# Patient Record
Sex: Female | Born: 1935
Health system: Southern US, Community
[De-identification: ages and names within clinical notes are randomized; demographics above are authoritative.]

## PROBLEM LIST (undated history)

## (undated) DIAGNOSIS — K219 Gastro-esophageal reflux disease without esophagitis: Secondary | ICD-10-CM

## (undated) DIAGNOSIS — G4730 Sleep apnea, unspecified: Secondary | ICD-10-CM

## (undated) DIAGNOSIS — I10 Essential (primary) hypertension: Secondary | ICD-10-CM

## (undated) DIAGNOSIS — C801 Malignant (primary) neoplasm, unspecified: Secondary | ICD-10-CM

## (undated) DIAGNOSIS — E785 Hyperlipidemia, unspecified: Secondary | ICD-10-CM

## (undated) DIAGNOSIS — M1990 Unspecified osteoarthritis, unspecified site: Secondary | ICD-10-CM

## (undated) DIAGNOSIS — R600 Localized edema: Secondary | ICD-10-CM

## (undated) DIAGNOSIS — T7840XA Allergy, unspecified, initial encounter: Secondary | ICD-10-CM

## (undated) DIAGNOSIS — D649 Anemia, unspecified: Secondary | ICD-10-CM

## (undated) HISTORY — DX: Allergy, unspecified, initial encounter: T78.40XA

## (undated) HISTORY — PX: PARTIAL HIP ARTHROPLASTY: SHX733

## (undated) HISTORY — DX: Unspecified osteoarthritis, unspecified site: M19.90

## (undated) HISTORY — DX: Hyperlipidemia, unspecified: E78.5

## (undated) HISTORY — DX: Essential (primary) hypertension: I10

## (undated) HISTORY — PX: LARYNGECTOMY: SUR815

## (undated) HISTORY — DX: Gastro-esophageal reflux disease without esophagitis: K21.9

## (undated) HISTORY — DX: Malignant (primary) neoplasm, unspecified: C80.1

## (undated) HISTORY — DX: Anemia, unspecified: D64.9

---

## 1997-11-02 ENCOUNTER — Ambulatory Visit (HOSPITAL_COMMUNITY): Admission: RE | Admit: 1997-11-02 | Discharge: 1997-11-02 | Payer: Self-pay | Admitting: Internal Medicine

## 1998-11-08 ENCOUNTER — Ambulatory Visit (HOSPITAL_COMMUNITY): Admission: RE | Admit: 1998-11-08 | Discharge: 1998-11-08 | Payer: Self-pay | Admitting: Internal Medicine

## 1998-11-08 ENCOUNTER — Encounter: Payer: Self-pay | Admitting: Internal Medicine

## 1999-09-12 ENCOUNTER — Other Ambulatory Visit: Admission: RE | Admit: 1999-09-12 | Discharge: 1999-09-12 | Payer: Self-pay | Admitting: Internal Medicine

## 1999-11-09 ENCOUNTER — Encounter: Payer: Self-pay | Admitting: Internal Medicine

## 1999-11-09 ENCOUNTER — Ambulatory Visit (HOSPITAL_COMMUNITY): Admission: RE | Admit: 1999-11-09 | Discharge: 1999-11-09 | Payer: Self-pay

## 2000-11-19 ENCOUNTER — Encounter: Payer: Self-pay | Admitting: Internal Medicine

## 2000-11-19 ENCOUNTER — Ambulatory Visit (HOSPITAL_COMMUNITY): Admission: RE | Admit: 2000-11-19 | Discharge: 2000-11-19 | Payer: Self-pay | Admitting: Internal Medicine

## 2001-11-25 ENCOUNTER — Encounter: Payer: Self-pay | Admitting: Internal Medicine

## 2001-11-25 ENCOUNTER — Ambulatory Visit (HOSPITAL_COMMUNITY): Admission: RE | Admit: 2001-11-25 | Discharge: 2001-11-25 | Payer: Self-pay | Admitting: Internal Medicine

## 2002-03-05 LAB — HM PAP SMEAR

## 2002-05-05 ENCOUNTER — Other Ambulatory Visit: Admission: RE | Admit: 2002-05-05 | Discharge: 2002-05-05 | Payer: Self-pay | Admitting: Family Medicine

## 2002-05-05 LAB — CONVERTED CEMR LAB: Pap Smear: NORMAL

## 2002-11-27 ENCOUNTER — Encounter: Payer: Self-pay | Admitting: Internal Medicine

## 2002-11-27 ENCOUNTER — Ambulatory Visit (HOSPITAL_COMMUNITY): Admission: RE | Admit: 2002-11-27 | Discharge: 2002-11-27 | Payer: Self-pay | Admitting: Internal Medicine

## 2003-08-24 ENCOUNTER — Encounter: Admission: RE | Admit: 2003-08-24 | Discharge: 2003-11-22 | Payer: Self-pay | Admitting: Internal Medicine

## 2003-09-22 ENCOUNTER — Ambulatory Visit (HOSPITAL_BASED_OUTPATIENT_CLINIC_OR_DEPARTMENT_OTHER): Admission: RE | Admit: 2003-09-22 | Discharge: 2003-09-22 | Payer: Self-pay | Admitting: Internal Medicine

## 2003-12-07 ENCOUNTER — Ambulatory Visit (HOSPITAL_COMMUNITY): Admission: RE | Admit: 2003-12-07 | Discharge: 2003-12-07 | Payer: Self-pay | Admitting: Internal Medicine

## 2003-12-17 ENCOUNTER — Ambulatory Visit: Payer: Self-pay | Admitting: Internal Medicine

## 2004-02-07 ENCOUNTER — Ambulatory Visit: Payer: Self-pay | Admitting: Pulmonary Disease

## 2004-03-02 ENCOUNTER — Ambulatory Visit: Payer: Self-pay | Admitting: Pulmonary Disease

## 2004-03-07 ENCOUNTER — Inpatient Hospital Stay (HOSPITAL_COMMUNITY): Admission: RE | Admit: 2004-03-07 | Discharge: 2004-03-10 | Payer: Self-pay | Admitting: Orthopedic Surgery

## 2004-03-07 ENCOUNTER — Ambulatory Visit: Payer: Self-pay | Admitting: Physical Medicine & Rehabilitation

## 2004-03-10 ENCOUNTER — Inpatient Hospital Stay
Admission: RE | Admit: 2004-03-10 | Discharge: 2004-03-21 | Payer: Self-pay | Admitting: Physical Medicine & Rehabilitation

## 2004-09-15 ENCOUNTER — Ambulatory Visit: Payer: Self-pay | Admitting: Internal Medicine

## 2004-12-12 ENCOUNTER — Ambulatory Visit: Payer: Self-pay | Admitting: Internal Medicine

## 2004-12-12 ENCOUNTER — Ambulatory Visit (HOSPITAL_COMMUNITY): Admission: RE | Admit: 2004-12-12 | Discharge: 2004-12-12 | Payer: Self-pay | Admitting: Internal Medicine

## 2004-12-28 ENCOUNTER — Ambulatory Visit: Payer: Self-pay | Admitting: Internal Medicine

## 2005-03-13 ENCOUNTER — Ambulatory Visit: Payer: Self-pay | Admitting: Internal Medicine

## 2005-04-30 ENCOUNTER — Ambulatory Visit: Payer: Self-pay | Admitting: Internal Medicine

## 2005-05-07 ENCOUNTER — Ambulatory Visit: Payer: Self-pay | Admitting: Internal Medicine

## 2005-11-02 ENCOUNTER — Ambulatory Visit: Payer: Self-pay | Admitting: Internal Medicine

## 2005-11-09 ENCOUNTER — Ambulatory Visit: Payer: Self-pay | Admitting: Internal Medicine

## 2005-12-13 ENCOUNTER — Ambulatory Visit: Payer: Self-pay | Admitting: Internal Medicine

## 2005-12-13 ENCOUNTER — Ambulatory Visit (HOSPITAL_COMMUNITY): Admission: RE | Admit: 2005-12-13 | Discharge: 2005-12-13 | Payer: Self-pay | Admitting: Internal Medicine

## 2006-03-05 LAB — HM COLONOSCOPY

## 2006-03-05 LAB — HM DEXA SCAN

## 2006-06-27 ENCOUNTER — Ambulatory Visit: Payer: Self-pay | Admitting: Internal Medicine

## 2006-06-27 LAB — CONVERTED CEMR LAB
BUN: 8 mg/dL (ref 6–23)
Basophils Absolute: 0.1 10*3/uL (ref 0.0–0.1)
Basophils Relative: 0.6 % (ref 0.0–1.0)
CO2: 30 meq/L (ref 19–32)
Calcium: 9.1 mg/dL (ref 8.4–10.5)
Chloride: 103 meq/L (ref 96–112)
Creatinine, Ser: 0.6 mg/dL (ref 0.4–1.2)
Eosinophils Absolute: 0.3 10*3/uL (ref 0.0–0.6)
Eosinophils Relative: 3.1 % (ref 0.0–5.0)
GFR calc Af Amer: 126 mL/min
GFR calc non Af Amer: 104 mL/min
Glucose, Bld: 70 mg/dL (ref 70–99)
HCT: 37.3 % (ref 36.0–46.0)
Hemoglobin: 12 g/dL (ref 12.0–15.0)
Lymphocytes Relative: 25.3 % (ref 12.0–46.0)
MCHC: 32.2 g/dL (ref 30.0–36.0)
MCV: 80.6 fL (ref 78.0–100.0)
Monocytes Absolute: 0.7 10*3/uL (ref 0.2–0.7)
Monocytes Relative: 7.3 % (ref 3.0–11.0)
Neutro Abs: 5.7 10*3/uL (ref 1.4–7.7)
Neutrophils Relative %: 63.7 % (ref 43.0–77.0)
Platelets: 383 10*3/uL (ref 150–400)
Potassium: 4 meq/L (ref 3.5–5.1)
RBC: 4.63 M/uL (ref 3.87–5.11)
RDW: 14.5 % (ref 11.5–14.6)
Sodium: 141 meq/L (ref 135–145)
TSH: 0.44 microintl units/mL (ref 0.35–5.50)
Vit D, 1,25-Dihydroxy: 32 (ref 20–57)
WBC: 9.1 10*3/uL (ref 4.5–10.5)

## 2006-07-01 ENCOUNTER — Ambulatory Visit: Payer: Self-pay | Admitting: Internal Medicine

## 2006-07-19 ENCOUNTER — Ambulatory Visit: Payer: Self-pay | Admitting: Gastroenterology

## 2006-07-30 ENCOUNTER — Ambulatory Visit: Payer: Self-pay | Admitting: Gastroenterology

## 2006-07-30 LAB — HM COLONOSCOPY

## 2006-10-10 ENCOUNTER — Telehealth: Payer: Self-pay | Admitting: Internal Medicine

## 2006-12-04 ENCOUNTER — Encounter: Payer: Self-pay | Admitting: Internal Medicine

## 2006-12-05 ENCOUNTER — Telehealth (INDEPENDENT_AMBULATORY_CARE_PROVIDER_SITE_OTHER): Payer: Self-pay | Admitting: *Deleted

## 2006-12-06 ENCOUNTER — Ambulatory Visit: Payer: Self-pay | Admitting: Internal Medicine

## 2006-12-06 DIAGNOSIS — I10 Essential (primary) hypertension: Secondary | ICD-10-CM | POA: Insufficient documentation

## 2006-12-06 DIAGNOSIS — E785 Hyperlipidemia, unspecified: Secondary | ICD-10-CM | POA: Insufficient documentation

## 2006-12-06 DIAGNOSIS — J45909 Unspecified asthma, uncomplicated: Secondary | ICD-10-CM | POA: Insufficient documentation

## 2006-12-06 DIAGNOSIS — K219 Gastro-esophageal reflux disease without esophagitis: Secondary | ICD-10-CM | POA: Insufficient documentation

## 2006-12-06 DIAGNOSIS — J309 Allergic rhinitis, unspecified: Secondary | ICD-10-CM | POA: Insufficient documentation

## 2006-12-06 DIAGNOSIS — M1990 Unspecified osteoarthritis, unspecified site: Secondary | ICD-10-CM | POA: Insufficient documentation

## 2006-12-18 ENCOUNTER — Ambulatory Visit (HOSPITAL_COMMUNITY): Admission: RE | Admit: 2006-12-18 | Discharge: 2006-12-18 | Payer: Self-pay | Admitting: Internal Medicine

## 2007-05-16 ENCOUNTER — Ambulatory Visit: Payer: Self-pay | Admitting: Cardiology

## 2007-05-29 ENCOUNTER — Encounter: Payer: Self-pay | Admitting: Internal Medicine

## 2007-05-29 ENCOUNTER — Ambulatory Visit: Payer: Self-pay

## 2007-05-29 ENCOUNTER — Encounter: Payer: Self-pay | Admitting: Cardiology

## 2007-09-19 ENCOUNTER — Telehealth (INDEPENDENT_AMBULATORY_CARE_PROVIDER_SITE_OTHER): Payer: Self-pay | Admitting: *Deleted

## 2007-11-25 ENCOUNTER — Ambulatory Visit: Payer: Self-pay | Admitting: Internal Medicine

## 2007-12-25 ENCOUNTER — Ambulatory Visit (HOSPITAL_COMMUNITY): Admission: RE | Admit: 2007-12-25 | Discharge: 2007-12-25 | Payer: Self-pay | Admitting: Internal Medicine

## 2007-12-25 LAB — HM MAMMOGRAPHY

## 2007-12-30 ENCOUNTER — Encounter: Payer: Self-pay | Admitting: Internal Medicine

## 2008-03-26 ENCOUNTER — Ambulatory Visit: Payer: Self-pay | Admitting: Internal Medicine

## 2008-03-26 DIAGNOSIS — N318 Other neuromuscular dysfunction of bladder: Secondary | ICD-10-CM | POA: Insufficient documentation

## 2008-03-26 DIAGNOSIS — R0609 Other forms of dyspnea: Secondary | ICD-10-CM | POA: Insufficient documentation

## 2008-03-26 DIAGNOSIS — M899 Disorder of bone, unspecified: Secondary | ICD-10-CM | POA: Insufficient documentation

## 2008-03-26 DIAGNOSIS — M949 Disorder of cartilage, unspecified: Secondary | ICD-10-CM

## 2008-03-26 DIAGNOSIS — R0989 Other specified symptoms and signs involving the circulatory and respiratory systems: Secondary | ICD-10-CM | POA: Insufficient documentation

## 2008-03-29 ENCOUNTER — Encounter: Payer: Self-pay | Admitting: Internal Medicine

## 2008-03-29 LAB — CONVERTED CEMR LAB
ALT: 15 units/L (ref 0–35)
AST: 20 units/L (ref 0–37)
Albumin: 3.6 g/dL (ref 3.5–5.2)
Alkaline Phosphatase: 74 units/L (ref 39–117)
BUN: 10 mg/dL (ref 6–23)
Basophils Absolute: 0 10*3/uL (ref 0.0–0.1)
Basophils Relative: 0.3 % (ref 0.0–3.0)
Bilirubin, Direct: 0.1 mg/dL (ref 0.0–0.3)
CO2: 31 meq/L (ref 19–32)
Calcium: 9.1 mg/dL (ref 8.4–10.5)
Chloride: 104 meq/L (ref 96–112)
Cholesterol: 162 mg/dL (ref 0–200)
Creatinine, Ser: 0.7 mg/dL (ref 0.4–1.2)
Eosinophils Absolute: 0.3 10*3/uL (ref 0.0–0.7)
Eosinophils Relative: 3.4 % (ref 0.0–5.0)
GFR calc Af Amer: 105 mL/min
GFR calc non Af Amer: 87 mL/min
Glucose, Bld: 94 mg/dL (ref 70–99)
HCT: 30.3 % — ABNORMAL LOW (ref 36.0–46.0)
HDL: 56 mg/dL (ref >39.0)
Hemoglobin: 9.7 g/dL — ABNORMAL LOW (ref 12.0–15.0)
LDL Cholesterol: 84 mg/dL (ref 0–99)
Lymphocytes Relative: 22.5 % (ref 12.0–46.0)
MCHC: 32 g/dL (ref 30.0–36.0)
MCV: 73.5 fL — ABNORMAL LOW (ref 78.0–100.0)
Monocytes Absolute: 0.5 10*3/uL (ref 0.1–1.0)
Monocytes Relative: 6.6 % (ref 3.0–12.0)
Neutro Abs: 5.1 10*3/uL (ref 1.4–7.7)
Neutrophils Relative %: 67.2 % (ref 43.0–77.0)
Platelets: 371 10*3/uL (ref 150–400)
Potassium: 3.6 meq/L (ref 3.5–5.1)
RBC: 4.13 M/uL (ref 3.87–5.11)
RDW: 16.6 % — ABNORMAL HIGH (ref 11.5–14.6)
Sodium: 141 meq/L (ref 135–145)
TSH: 0.57 microintl units/mL (ref 0.35–5.50)
Total Bilirubin: 0.7 mg/dL (ref 0.3–1.2)
Total CHOL/HDL Ratio: 2.9
Total Protein: 7.4 g/dL (ref 6.0–8.3)
Triglycerides: 109 mg/dL (ref 0–149)
VLDL: 22 mg/dL (ref 0–40)
WBC: 7.6 10*3/uL (ref 4.5–10.5)

## 2008-04-01 LAB — CONVERTED CEMR LAB: Vit D, 1,25-Dihydroxy: 21 — ABNORMAL LOW (ref 30–89)

## 2008-04-13 ENCOUNTER — Ambulatory Visit: Payer: Self-pay | Admitting: Internal Medicine

## 2008-04-13 DIAGNOSIS — E559 Vitamin D deficiency, unspecified: Secondary | ICD-10-CM | POA: Insufficient documentation

## 2008-04-14 LAB — CONVERTED CEMR LAB
Basophils Absolute: 0 10*3/uL (ref 0.0–0.1)
Basophils Relative: 0.3 % (ref 0.0–3.0)
Eosinophils Absolute: 0.2 10*3/uL (ref 0.0–0.7)
Eosinophils Relative: 2.6 % (ref 0.0–5.0)
Ferritin: 7.7 ng/mL — ABNORMAL LOW (ref 10.0–291.0)
Folate: 13.6 ng/mL
HCT: 32.3 % — ABNORMAL LOW (ref 36.0–46.0)
Hemoglobin: 10.3 g/dL — ABNORMAL LOW (ref 12.0–15.0)
Lymphocytes Relative: 21.7 % (ref 12.0–46.0)
MCHC: 31.9 g/dL (ref 30.0–36.0)
MCV: 73.6 fL — ABNORMAL LOW (ref 78.0–100.0)
Monocytes Absolute: 0.5 10*3/uL (ref 0.1–1.0)
Monocytes Relative: 6.2 % (ref 3.0–12.0)
Neutro Abs: 6 10*3/uL (ref 1.4–7.7)
Neutrophils Relative %: 69.2 % (ref 43.0–77.0)
Platelets: 351 10*3/uL (ref 150–400)
RBC: 4.39 M/uL (ref 3.87–5.11)
RDW: 18 % — ABNORMAL HIGH (ref 11.5–14.6)
Vitamin B-12: 379 pg/mL (ref 211–911)
WBC: 8.6 10*3/uL (ref 4.5–10.5)

## 2008-04-19 ENCOUNTER — Encounter: Payer: Self-pay | Admitting: Internal Medicine

## 2008-04-19 ENCOUNTER — Ambulatory Visit: Payer: Self-pay

## 2008-05-06 ENCOUNTER — Ambulatory Visit: Payer: Self-pay | Admitting: Internal Medicine

## 2008-05-06 LAB — CONVERTED CEMR LAB
OCCULT 1: NEGATIVE
OCCULT 2: NEGATIVE
OCCULT 3: NEGATIVE

## 2008-05-10 ENCOUNTER — Encounter: Payer: Self-pay | Admitting: Internal Medicine

## 2008-05-24 ENCOUNTER — Ambulatory Visit: Payer: Self-pay | Admitting: Internal Medicine

## 2008-05-24 DIAGNOSIS — D509 Iron deficiency anemia, unspecified: Secondary | ICD-10-CM | POA: Insufficient documentation

## 2008-05-24 LAB — CONVERTED CEMR LAB: Hemoglobin: 11.7 g/dL

## 2008-06-01 ENCOUNTER — Encounter: Payer: Self-pay | Admitting: Internal Medicine

## 2008-07-26 ENCOUNTER — Ambulatory Visit: Payer: Self-pay | Admitting: Internal Medicine

## 2008-07-26 LAB — CONVERTED CEMR LAB: Vit D, 25-Hydroxy: 67 ng/mL (ref 30–89)

## 2008-07-30 LAB — CONVERTED CEMR LAB
Basophils Absolute: 0 10*3/uL (ref 0.0–0.1)
Basophils Relative: 0.5 % (ref 0.0–3.0)
Eosinophils Absolute: 0.3 10*3/uL (ref 0.0–0.7)
Eosinophils Relative: 2.9 % (ref 0.0–5.0)
Ferritin: 7.4 ng/mL — ABNORMAL LOW (ref 10.0–291.0)
HCT: 35 % — ABNORMAL LOW (ref 36.0–46.0)
Hemoglobin: 11.6 g/dL — ABNORMAL LOW (ref 12.0–15.0)
Lymphocytes Relative: 20.2 % (ref 12.0–46.0)
Lymphs Abs: 1.8 10*3/uL (ref 0.7–4.0)
MCHC: 33.3 g/dL (ref 30.0–36.0)
MCV: 76 fL — ABNORMAL LOW (ref 78.0–100.0)
Monocytes Absolute: 0.5 10*3/uL (ref 0.1–1.0)
Monocytes Relative: 5.1 % (ref 3.0–12.0)
Neutro Abs: 6.4 10*3/uL (ref 1.4–7.7)
Neutrophils Relative %: 71.3 % (ref 43.0–77.0)
Platelets: 371 10*3/uL (ref 150.0–400.0)
RBC: 4.6 M/uL (ref 3.87–5.11)
RDW: 17.7 % — ABNORMAL HIGH (ref 11.5–14.6)
Sed Rate: 35 mm/hr — ABNORMAL HIGH (ref 0–22)
WBC: 9 10*3/uL (ref 4.5–10.5)

## 2008-08-27 DIAGNOSIS — K5730 Diverticulosis of large intestine without perforation or abscess without bleeding: Secondary | ICD-10-CM | POA: Insufficient documentation

## 2008-08-31 ENCOUNTER — Ambulatory Visit: Payer: Self-pay | Admitting: Gastroenterology

## 2008-09-24 ENCOUNTER — Telehealth: Payer: Self-pay | Admitting: Gastroenterology

## 2008-10-12 ENCOUNTER — Telehealth: Payer: Self-pay | Admitting: *Deleted

## 2008-11-03 ENCOUNTER — Ambulatory Visit: Payer: Self-pay | Admitting: Gastroenterology

## 2008-11-10 ENCOUNTER — Ambulatory Visit: Payer: Self-pay | Admitting: Gastroenterology

## 2008-11-11 ENCOUNTER — Telehealth: Payer: Self-pay | Admitting: Gastroenterology

## 2008-11-11 ENCOUNTER — Encounter: Payer: Self-pay | Admitting: Gastroenterology

## 2008-11-15 ENCOUNTER — Ambulatory Visit: Payer: Self-pay | Admitting: Gastroenterology

## 2008-11-15 ENCOUNTER — Telehealth (INDEPENDENT_AMBULATORY_CARE_PROVIDER_SITE_OTHER): Payer: Self-pay | Admitting: *Deleted

## 2008-12-08 ENCOUNTER — Telehealth: Payer: Self-pay | Admitting: Gastroenterology

## 2008-12-10 ENCOUNTER — Encounter: Payer: Self-pay | Admitting: Gastroenterology

## 2009-01-05 ENCOUNTER — Ambulatory Visit: Payer: Self-pay | Admitting: Gastroenterology

## 2009-01-05 LAB — CONVERTED CEMR LAB
Basophils Absolute: 0.1 10*3/uL (ref 0.0–0.1)
Basophils Relative: 0.7 % (ref 0.0–3.0)
Eosinophils Absolute: 0.3 10*3/uL (ref 0.0–0.7)
Eosinophils Relative: 3 % (ref 0.0–5.0)
HCT: 36.1 % (ref 36.0–46.0)
Hemoglobin: 11.7 g/dL — ABNORMAL LOW (ref 12.0–15.0)
Lymphocytes Relative: 25.4 % (ref 12.0–46.0)
Lymphs Abs: 2.2 10*3/uL (ref 0.7–4.0)
MCHC: 32.3 g/dL (ref 30.0–36.0)
MCV: 79.9 fL (ref 78.0–100.0)
Monocytes Absolute: 0.6 10*3/uL (ref 0.1–1.0)
Monocytes Relative: 6.9 % (ref 3.0–12.0)
Neutro Abs: 5.6 10*3/uL (ref 1.4–7.7)
Neutrophils Relative %: 64 % (ref 43.0–77.0)
Platelets: 371 10*3/uL (ref 150.0–400.0)
RBC: 4.52 M/uL (ref 3.87–5.11)
RDW: 16.3 % — ABNORMAL HIGH (ref 11.5–14.6)
WBC: 8.8 10*3/uL (ref 4.5–10.5)

## 2009-01-06 ENCOUNTER — Ambulatory Visit (HOSPITAL_COMMUNITY): Admission: RE | Admit: 2009-01-06 | Discharge: 2009-01-06 | Payer: Self-pay | Admitting: Internal Medicine

## 2009-01-12 ENCOUNTER — Ambulatory Visit: Payer: Self-pay | Admitting: Gastroenterology

## 2009-01-12 ENCOUNTER — Ambulatory Visit (HOSPITAL_COMMUNITY): Admission: RE | Admit: 2009-01-12 | Discharge: 2009-01-12 | Payer: Self-pay | Admitting: Gastroenterology

## 2009-02-03 ENCOUNTER — Ambulatory Visit: Payer: Self-pay | Admitting: Internal Medicine

## 2009-02-07 ENCOUNTER — Encounter: Payer: Self-pay | Admitting: Gastroenterology

## 2009-02-09 ENCOUNTER — Encounter: Payer: Self-pay | Admitting: Internal Medicine

## 2009-03-05 DIAGNOSIS — D649 Anemia, unspecified: Secondary | ICD-10-CM

## 2009-03-05 HISTORY — DX: Anemia, unspecified: D64.9

## 2009-03-11 ENCOUNTER — Telehealth: Payer: Self-pay | Admitting: *Deleted

## 2009-04-07 ENCOUNTER — Ambulatory Visit: Payer: Self-pay | Admitting: Gastroenterology

## 2009-04-07 DIAGNOSIS — K5521 Angiodysplasia of colon with hemorrhage: Secondary | ICD-10-CM | POA: Insufficient documentation

## 2009-04-07 LAB — CONVERTED CEMR LAB
Basophils Absolute: 0 10*3/uL (ref 0.0–0.1)
Basophils Relative: 0.3 % (ref 0.0–3.0)
Eosinophils Absolute: 0.3 10*3/uL (ref 0.0–0.7)
Eosinophils Relative: 3.5 % (ref 0.0–5.0)
HCT: 30.7 % — ABNORMAL LOW (ref 36.0–46.0)
Hemoglobin: 9.8 g/dL — ABNORMAL LOW (ref 12.0–15.0)
Lymphocytes Relative: 21 % (ref 12.0–46.0)
Lymphs Abs: 1.7 10*3/uL (ref 0.7–4.0)
MCHC: 31.9 g/dL (ref 30.0–36.0)
MCV: 79.7 fL (ref 78.0–100.0)
Monocytes Absolute: 0.5 10*3/uL (ref 0.1–1.0)
Monocytes Relative: 6.4 % (ref 3.0–12.0)
Neutro Abs: 5.4 10*3/uL (ref 1.4–7.7)
Neutrophils Relative %: 68.8 % (ref 43.0–77.0)
Platelets: 361 10*3/uL (ref 150.0–400.0)
RBC: 3.85 M/uL — ABNORMAL LOW (ref 3.87–5.11)
RDW: 16 % — ABNORMAL HIGH (ref 11.5–14.6)
WBC: 7.9 10*3/uL (ref 4.5–10.5)

## 2009-05-16 ENCOUNTER — Telehealth: Payer: Self-pay | Admitting: Gastroenterology

## 2009-05-31 ENCOUNTER — Telehealth: Payer: Self-pay | Admitting: *Deleted

## 2009-07-07 ENCOUNTER — Ambulatory Visit: Payer: Self-pay | Admitting: Gastroenterology

## 2009-07-07 ENCOUNTER — Telehealth: Payer: Self-pay | Admitting: Gastroenterology

## 2009-07-07 LAB — CONVERTED CEMR LAB
Basophils Absolute: 0.1 10*3/uL (ref 0.0–0.1)
Basophils Relative: 1 % (ref 0.0–3.0)
Eosinophils Absolute: 0.3 10*3/uL (ref 0.0–0.7)
Eosinophils Relative: 3.3 % (ref 0.0–5.0)
HCT: 38.1 % (ref 36.0–46.0)
Hemoglobin: 12.5 g/dL (ref 12.0–15.0)
Lymphocytes Relative: 23.2 % (ref 12.0–46.0)
Lymphs Abs: 2.2 10*3/uL (ref 0.7–4.0)
MCHC: 32.7 g/dL (ref 30.0–36.0)
MCV: 82.1 fL (ref 78.0–100.0)
Monocytes Absolute: 0.6 10*3/uL (ref 0.1–1.0)
Monocytes Relative: 6.3 % (ref 3.0–12.0)
Neutro Abs: 6.3 10*3/uL (ref 1.4–7.7)
Neutrophils Relative %: 66.2 % (ref 43.0–77.0)
Platelets: 359 10*3/uL (ref 150.0–400.0)
RBC: 4.65 M/uL (ref 3.87–5.11)
RDW: 18.9 % — ABNORMAL HIGH (ref 11.5–14.6)
WBC: 9.6 10*3/uL (ref 4.5–10.5)

## 2009-07-27 ENCOUNTER — Telehealth: Payer: Self-pay | Admitting: Gastroenterology

## 2009-08-02 ENCOUNTER — Telehealth: Payer: Self-pay | Admitting: *Deleted

## 2009-08-09 ENCOUNTER — Encounter: Payer: Self-pay | Admitting: Internal Medicine

## 2009-08-26 ENCOUNTER — Ambulatory Visit: Payer: Self-pay | Admitting: Internal Medicine

## 2009-10-27 ENCOUNTER — Ambulatory Visit: Payer: Self-pay | Admitting: Internal Medicine

## 2009-10-27 LAB — CONVERTED CEMR LAB
ALT: 13 units/L (ref 0–35)
AST: 21 units/L (ref 0–37)
Albumin: 3.7 g/dL (ref 3.5–5.2)
Alkaline Phosphatase: 66 units/L (ref 39–117)
BUN: 11 mg/dL (ref 6–23)
Basophils Absolute: 0.1 10*3/uL (ref 0.0–0.1)
Basophils Relative: 1.3 % (ref 0.0–3.0)
Bilirubin, Direct: 0.1 mg/dL (ref 0.0–0.3)
CO2: 29 meq/L (ref 19–32)
Calcium: 9.1 mg/dL (ref 8.4–10.5)
Chloride: 104 meq/L (ref 96–112)
Cholesterol: 188 mg/dL (ref 0–200)
Creatinine, Ser: 0.7 mg/dL (ref 0.4–1.2)
Eosinophils Absolute: 0.3 10*3/uL (ref 0.0–0.7)
Eosinophils Relative: 3.6 % (ref 0.0–5.0)
GFR calc non Af Amer: 111.57 mL/min (ref >60)
Glucose, Bld: 88 mg/dL (ref 70–99)
HCT: 36.8 % (ref 36.0–46.0)
HDL: 56.4 mg/dL (ref >39.00)
Hemoglobin: 12.3 g/dL (ref 12.0–15.0)
Iron: 56 ug/dL (ref 42–145)
LDL Cholesterol: 109 mg/dL — ABNORMAL HIGH (ref 0–99)
Lymphocytes Relative: 22.9 % (ref 12.0–46.0)
Lymphs Abs: 1.8 10*3/uL (ref 0.7–4.0)
MCHC: 33.5 g/dL (ref 30.0–36.0)
MCV: 84.1 fL (ref 78.0–100.0)
Magnesium: 2 mg/dL (ref 1.5–2.5)
Monocytes Absolute: 0.6 10*3/uL (ref 0.1–1.0)
Monocytes Relative: 7.1 % (ref 3.0–12.0)
Neutro Abs: 5.3 10*3/uL (ref 1.4–7.7)
Neutrophils Relative %: 65.1 % (ref 43.0–77.0)
Platelets: 316 10*3/uL (ref 150.0–400.0)
Potassium: 3.9 meq/L (ref 3.5–5.1)
RBC: 4.37 M/uL (ref 3.87–5.11)
RDW: 15.1 % — ABNORMAL HIGH (ref 11.5–14.6)
Saturation Ratios: 17.7 % — ABNORMAL LOW (ref 20.0–50.0)
Sodium: 142 meq/L (ref 135–145)
TSH: 0.38 microintl units/mL (ref 0.35–5.50)
Total Bilirubin: 0.5 mg/dL (ref 0.3–1.2)
Total CHOL/HDL Ratio: 3
Total Protein: 6.9 g/dL (ref 6.0–8.3)
Transferrin: 225.4 mg/dL (ref 212.0–360.0)
Triglycerides: 112 mg/dL (ref 0.0–149.0)
VLDL: 22.4 mg/dL (ref 0.0–40.0)
Vit D, 25-Hydroxy: 40 ng/mL (ref 30–89)
WBC: 8.1 10*3/uL (ref 4.5–10.5)

## 2009-11-21 ENCOUNTER — Ambulatory Visit: Payer: Self-pay | Admitting: Family Medicine

## 2009-11-29 ENCOUNTER — Ambulatory Visit: Payer: Self-pay | Admitting: Internal Medicine

## 2009-11-29 DIAGNOSIS — R609 Edema, unspecified: Secondary | ICD-10-CM | POA: Insufficient documentation

## 2010-01-09 ENCOUNTER — Ambulatory Visit (HOSPITAL_COMMUNITY): Admission: RE | Admit: 2010-01-09 | Discharge: 2010-01-09 | Payer: Self-pay | Admitting: Internal Medicine

## 2010-01-30 ENCOUNTER — Ambulatory Visit: Payer: Self-pay | Admitting: Internal Medicine

## 2010-01-30 DIAGNOSIS — R300 Dysuria: Secondary | ICD-10-CM | POA: Insufficient documentation

## 2010-01-30 DIAGNOSIS — R630 Anorexia: Secondary | ICD-10-CM | POA: Insufficient documentation

## 2010-01-30 LAB — CONVERTED CEMR LAB
Glucose, Urine, Semiquant: NEGATIVE
Nitrite: NEGATIVE
Specific Gravity, Urine: 1.015
Urobilinogen, UA: 1
pH: 7

## 2010-01-31 ENCOUNTER — Encounter: Payer: Self-pay | Admitting: Internal Medicine

## 2010-02-02 ENCOUNTER — Encounter: Admission: RE | Admit: 2010-02-02 | Discharge: 2010-02-02 | Payer: Self-pay | Admitting: Internal Medicine

## 2010-02-06 ENCOUNTER — Telehealth: Payer: Self-pay | Admitting: *Deleted

## 2010-03-10 ENCOUNTER — Encounter
Admission: RE | Admit: 2010-03-10 | Discharge: 2010-03-10 | Payer: Self-pay | Source: Home / Self Care | Attending: Internal Medicine | Admitting: Internal Medicine

## 2010-03-14 ENCOUNTER — Ambulatory Visit
Admission: RE | Admit: 2010-03-14 | Discharge: 2010-03-14 | Payer: Self-pay | Source: Home / Self Care | Attending: Internal Medicine | Admitting: Internal Medicine

## 2010-03-14 ENCOUNTER — Encounter: Payer: Self-pay | Admitting: Internal Medicine

## 2010-03-14 ENCOUNTER — Other Ambulatory Visit: Payer: Self-pay | Admitting: Internal Medicine

## 2010-03-14 ENCOUNTER — Other Ambulatory Visit: Payer: Self-pay | Admitting: Diagnostic Radiology

## 2010-03-14 ENCOUNTER — Encounter
Admission: RE | Admit: 2010-03-14 | Discharge: 2010-03-14 | Payer: Self-pay | Source: Home / Self Care | Attending: Internal Medicine | Admitting: Internal Medicine

## 2010-03-14 LAB — CBC WITH DIFFERENTIAL/PLATELET
Basophils Absolute: 0 10*3/uL (ref 0.0–0.1)
Basophils Relative: 0.3 % (ref 0.0–3.0)
Eosinophils Absolute: 0.3 10*3/uL (ref 0.0–0.7)
Eosinophils Relative: 3.7 % (ref 0.0–5.0)
HCT: 39 % (ref 36.0–46.0)
Hemoglobin: 12.9 g/dL (ref 12.0–15.0)
Lymphocytes Relative: 19.9 % (ref 12.0–46.0)
Lymphs Abs: 1.5 10*3/uL (ref 0.7–4.0)
MCHC: 33.1 g/dL (ref 30.0–36.0)
MCV: 84.8 fl (ref 78.0–100.0)
Monocytes Absolute: 0.5 10*3/uL (ref 0.1–1.0)
Monocytes Relative: 6.3 % (ref 3.0–12.0)
Neutro Abs: 5.4 10*3/uL (ref 1.4–7.7)
Neutrophils Relative %: 69.8 % (ref 43.0–77.0)
Platelets: 318 10*3/uL (ref 150.0–400.0)
RBC: 4.59 Mil/uL (ref 3.87–5.11)
RDW: 15.8 % — ABNORMAL HIGH (ref 11.5–14.6)
WBC: 7.7 10*3/uL (ref 4.5–10.5)

## 2010-03-14 LAB — IBC PANEL
Iron: 100 ug/dL (ref 42–145)
Saturation Ratios: 30.3 % (ref 20.0–50.0)
Transferrin: 235.6 mg/dL (ref 212.0–360.0)

## 2010-03-14 LAB — HM MAMMOGRAPHY

## 2010-03-17 ENCOUNTER — Telehealth: Payer: Self-pay | Admitting: *Deleted

## 2010-03-17 ENCOUNTER — Encounter: Payer: Self-pay | Admitting: Internal Medicine

## 2010-03-17 DIAGNOSIS — C50919 Malignant neoplasm of unspecified site of unspecified female breast: Secondary | ICD-10-CM | POA: Insufficient documentation

## 2010-03-21 ENCOUNTER — Ambulatory Visit: Admit: 2010-03-21 | Payer: Self-pay | Admitting: Internal Medicine

## 2010-03-22 LAB — CONVERTED CEMR LAB: Vit D, 25-Hydroxy: 52 ng/mL (ref 30–89)

## 2010-03-23 ENCOUNTER — Encounter
Admission: RE | Admit: 2010-03-23 | Discharge: 2010-03-23 | Payer: Self-pay | Source: Home / Self Care | Attending: Internal Medicine | Admitting: Internal Medicine

## 2010-03-23 ENCOUNTER — Ambulatory Visit: Admit: 2010-03-23 | Payer: Self-pay | Admitting: Internal Medicine

## 2010-03-25 ENCOUNTER — Other Ambulatory Visit: Payer: Self-pay | Admitting: General Surgery

## 2010-03-25 DIAGNOSIS — C50919 Malignant neoplasm of unspecified site of unspecified female breast: Secondary | ICD-10-CM

## 2010-03-26 ENCOUNTER — Encounter: Payer: Self-pay | Admitting: Internal Medicine

## 2010-03-29 ENCOUNTER — Other Ambulatory Visit (HOSPITAL_COMMUNITY): Payer: Self-pay | Admitting: General Surgery

## 2010-03-29 DIAGNOSIS — C50919 Malignant neoplasm of unspecified site of unspecified female breast: Secondary | ICD-10-CM

## 2010-04-04 NOTE — Assessment & Plan Note (Signed)
Summary: PT WILL COME IN FASTING/NJR   Vital Signs:  Patient Profile:   77 Years Old Female Height:     61 inches Weight:      182 pounds Pulse rate:   66 / minute BP sitting:   110 / 80  (left arm) Cuff size:   regular  Vitals Entered By: Romualdo Bolk, CMA (March 26, 2008 8:46 AM)  Menstrual History: LMP - Character: age 76 Menarche: 8                 Chief Complaint:  Annual visit for disease management.  History of Present Illness: [name] is here for yearly visit of disease management and needed care and med refill Current Problems:  CHEST DISCOMFORT, ATYPICAL (ICD-786.59)  saw Dr Daleen Squibb and had stress test    ok at present  OSTEOARTHRITIS (ICD-715.90) No change  HYPERTENSION (ICD-401.9)   up and down  HYPERLIPIDEMIA (ICD-272.4)  no se of meds  GERD (ICD-530.81)   ok on meds  ASTHMA (ICD-493.90) sharma      ALLERGIC RHINITIS (ICD-477.9) UI  controlled on medication.  Having decrease exercise tolerance recently months  winded going up stairs ? no change in asthma . NO recent pfts done.  No acute changes        Prior Medications Reviewed Using: Patient Recall  Prior Medication List:  ADVAIR DISKUS 500-50 MCG/DOSE MISC (FLUTICASONE-SALMETEROL)  FEXOFENADINE HCL 180 MG TABS (FEXOFENADINE HCL) 1 by mouth once daily KLOR-CON M20 20 MEQ TBCR (POTASSIUM CHLORIDE CRYS CR) 1 by mouth once daily LISINOPRIL-HYDROCHLOROTHIAZIDE 20-12.5 MG TABS (LISINOPRIL-HYDROCHLOROTHIAZIDE) 1 by mouth once daily NORVASC 5 MG TABS (AMLODIPINE BESYLATE) Take 1 tablet by mouth once a day OMEPRAZOLE 20 MG CPDR (OMEPRAZOLE) 1 by mouth once daily PATANOL 0.1 % SOLN (OLOPATADINE HCL)  SIMVASTATIN 20 MG TABS (SIMVASTATIN) Take 1 tablet by mouth once a day SINGULAIR 10 MG TABS (MONTELUKAST SODIUM)  VESICARE 5 MG  TABS (SOLIFENACIN SUCCINATE) 1 by mouth once daily   Current Allergies (reviewed today): No known allergies   Past Medical History:    Allergic rhinitis  Asthma    GERD    Hyperlipidemia    Hypertension   stress test 2009 saw Dr Daleen Squibb.    Osteoarthritis              LAST    Mammogram: 12/25/2007    Pap: 2004    Td: 2003    Colonscopy: 2008    EKG: 2008    Dexa: 2008    Eye Exam: 04/2007    Other: pneumovax 2005    Smoking: Former        Consults:    Dr. Arlyce Dice    Dr. Daleen Squibb  Past Surgical History:    Rt hip replacement 1/06   Family History:    Father: unsure    Mother:  breast cancer, ovarian cancer    Siblings: Only child   Social History:    widowed 2001    Former Smoker    HH of1     active in Brewing technologist activities.   Risk Factors:  Tobacco use:  quit    Year quit:  over 20 years  Mammogram History:     Date of Last Mammogram:  12/25/2007    Results:  Normal Bilateral   Colonoscopy History:     Date of Last Colonoscopy:  07/30/2006    Results:  Diverticulosis   PAP Smear History:     Date of Last PAP  Smear:  05/05/2002    Results:  Normal    Review of Systems       The patient complains of peripheral edema.  The patient denies anorexia, fever, weight loss, vision loss, syncope, prolonged cough, abdominal pain, melena, hematochezia, severe indigestion/heartburn, muscle weakness, depression, abnormal bleeding, enlarged lymph nodes, and angioedema.     Physical Exam  General:     Well-developed,well-nourished,in no acute distress; alert,appropriate and cooperative throughout examination Head:     normocephalic, atraumatic, and no abnormalities observed.   Eyes:     vision grossly intact, pupils equal, and pupils round.   Ears:     R ear normal and L ear normal.   Nose:     no external deformity, no external erythema, and no nasal discharge.   Mouth:     pharynx pink and moist.   Neck:     No deformities, masses, or tenderness noted. Chest Wall:     No deformities, masses, or tenderness noted. Breasts:     No mass, nodules, thickening, tenderness, bulging, retraction, inflamation,  nipple discharge or skin changes noted.   Lungs:     Normal respiratory effort, chest expands symmetrically. Lungs are clear to auscultation, no crackles or wheezes.no dullness.   Heart:     Normal rate and regular rhythm. S1 and S2 normal without gallop, murmur, click, rub or other extra sounds. Abdomen:     Bowel sounds positive,abdomen soft and non-tender without masses, organomegaly or hernias noted. Rectal:     No external abnormalities noted. Normal sphincter tone. No rectal masses or tenderness. Genitalia:     Pelvic Exam:        External: normal female genitalia without lesions or masses        Vagina: normal without lesions or masses        Cervix: normal without lesions or masses        Adnexa: normal bimanual exam without masses or fullness        Uterus: normal by palpation        Pap smear: not performed Msk:     no joint swelling, no joint warmth, and no redness over joints.   Pulses:     normal Extremities:     1+ left pedal edema and 1+ right pedal edema.   Neurologic:     alert & oriented X3, strength normal in all extremities, gait normal, and DTRs symmetrical and normal.   Skin:     turgor normal, color normal, no ecchymoses, no petechiae, and no purpura.   Cervical Nodes:     No lymphadenopathy noted Axillary Nodes:     No palpable lymphadenopathy Inguinal Nodes:     No significant adenopathy Psych:     Oriented X3, normally interactive, good eye contact, not anxious appearing, and not depressed appearing.      Impression & Recommendations:  Problem # 1:  HYPERTENSION (ICD-401.9)  Her updated medication list for this problem includes:    Lisinopril-hydrochlorothiazide 20-12.5 Mg Tabs (Lisinopril-hydrochlorothiazide) .Marland Kitchen... 1 by mouth once daily    Norvasc 5 Mg Tabs (Amlodipine besylate) .Marland Kitchen... Take 1 tablet by mouth once a day  Orders: Venipuncture ([address]) TLB-BMP (Basic Metabolic Panel-BMET) (80048-METABOL)   Problem # 2:  HYPERLIPIDEMIA  (ICD-272.4)  Her updated medication list for this problem includes:    Simvastatin 20 Mg Tabs (Simvastatin) .Marland Kitchen... Take 1 tablet by mouth once a day  Orders: Venipuncture ([address]) TLB-Hepatic/Liver Function Pnl (80076-HEPATIC) TLB-TSH (Thyroid Stimulating Hormone) (84443-TSH) TLB-Lipid Panel (80061-LIPID)  Problem # 3:  CHEST DISCOMFORT, ATYPICAL (ICD-786.59) see consult dr Daleen Squibb. no change  Problem # 4:  DYSPNEA ON EXERTION (ICD-786.09) Assessment: Deteriorated r/o anemia and pulmonary changes  Her updated medication list for this problem includes:    Advair Diskus 500-50 Mcg/dose Misc (Fluticasone-salmeterol)    Lisinopril-hydrochlorothiazide 20-12.5 Mg Tabs (Lisinopril-hydrochlorothiazide) .Marland Kitchen... 1 by mouth once daily    Singulair 10 Mg Tabs (Montelukast sodium)  Orders: Venipuncture ([address]) TLB-CBC Platelet - w/Differential (85025-CBCD)   Problem # 5:  GERD (ICD-530.81)  Her updated medication list for this problem includes:    Omeprazole 20 Mg Cpdr (Omeprazole) .Marland Kitchen... 1 by mouth once daily   Problem # 6:  ASTHMA (ICD-493.90)  Her updated medication list for this problem includes:    Advair Diskus 500-50 Mcg/dose Misc (Fluticasone-salmeterol)    Singulair 10 Mg Tabs (Montelukast sodium)   Problem # 7:  ALLERGIC RHINITIS (ICD-477.9)  Her updated medication list for this problem includes:    Fexofenadine Hcl 180 Mg Tabs (Fexofenadine hcl) .Marland Kitchen... 1 by mouth once daily   Problem # 8:  OVERACTIVE BLADDER (ICD-596.51) Assessment: Comment Only  Complete Medication List: 1)  Advair Diskus 500-50 Mcg/dose Misc (Fluticasone-salmeterol) 2)  Fexofenadine Hcl 180 Mg Tabs (Fexofenadine hcl) .Marland Kitchen.. 1 by mouth once daily 3)  Klor-con M20 20 Meq Tbcr (Potassium chloride crys cr) .Marland Kitchen.. 1 by mouth once daily 4)  Lisinopril-hydrochlorothiazide 20-12.5 Mg Tabs (Lisinopril-hydrochlorothiazide) .Marland Kitchen.. 1 by mouth once daily 5)  Norvasc 5 Mg Tabs (Amlodipine besylate) .... Take 1 tablet by  mouth once a day 6)  Omeprazole 20 Mg Cpdr (Omeprazole) .Marland Kitchen.. 1 by mouth once daily 7)  Patanol 0.1 % Soln (Olopatadine hcl) 8)  Simvastatin 20 Mg Tabs (Simvastatin) .... Take 1 tablet by mouth once a day 9)  Singulair 10 Mg Tabs (Montelukast sodium) 10)  Vesicare 5 Mg Tabs (Solifenacin succinate) .Marland Kitchen.. 1 by mouth once daily  Other Orders: T-Vitamin D (25-Hydroxy) [phone]) Pelvic & Breast Exam ( Medicare)  [phone])  Colorectal Screening:  Colonoscopy Results:    Date of Exam: 07/30/2006    Results: Diverticulosis  PAP Screening:    Last PAP smear:  05/05/2002  PAP Smear Results:    Date of Exam:  05/05/2002    Results:  Normal  Mammogram Screening:    Last Mammogram:  12/25/2007  Mammogram Results:    Date of Exam:  12/25/2007    Results:  Normal Bilateral  Osteoporosis Risk Assessment:  Risk Factors for Fracture or Low Bone Density:   Smoking status:       quit  Bone Density (DEXA Scan) Results:    Date of Exam:  07/01/2006    Results:  Normal  Immunization & Chemoprophylaxis:    Tetanus vaccine: Historical  (07/21/2001)    Influenza vaccine: Fluvax 3+  (11/25/2007)    Pneumovax: Historical  (03/06/2003)   Patient Instructions: 1)  will let you know about labs 2)  have Dr P[place] Callas get Pulmonary fucntion test or evaluation for your shortness of breath 3)  Will  order echo test also in the meantime ( unless other reasons for .sx  from lab)  and l follow up after that.   Prescriptions: VESICARE 5 MG  TABS (SOLIFENACIN SUCCINATE) 1 by mouth once daily  #90 x 3   Entered by:   Romualdo Bolk, CMA   Authorized by:   Madelin Headings MD   Signed by:   Madelin Headings MD on 03/26/2008   Method used:  Electronically to        [facility]* (mail-order)             ,          Ph: [phone]       Fax: [phone]   RxID:   [id number] SIMVASTATIN 20 MG TABS (SIMVASTATIN) Take 1 tablet by mouth once a day  #90 x 3   Entered by:   Romualdo Bolk,  CMA   Authorized by:   Madelin Headings MD   Signed by:   Madelin Headings MD on 03/26/2008   Method used:   Electronically to        [facility]* (mail-order)             ,          Ph: [phone]       Fax: [phone]   RxID:   [id number] OMEPRAZOLE 20 MG CPDR (OMEPRAZOLE) 1 by mouth once daily  #90 x 3   Entered by:   Romualdo Bolk, CMA   Authorized by:   Madelin Headings MD   Signed by:   Madelin Headings MD on 03/26/2008   Method used:   Electronically to        [facility]* (mail-order)             ,          Ph: [phone]       Fax: [phone]   RxID:   [id number] NORVASC 5 MG TABS (AMLODIPINE BESYLATE) Take 1 tablet by mouth once a day  #90 x 3   Entered by:   Romualdo Bolk, CMA   Authorized by:   Madelin Headings MD   Signed by:   Madelin Headings MD on 03/26/2008   Method used:   Electronically to        [facility]* (mail-order)             ,          Ph: [phone]       Fax: [phone]   RxID:   [id number] LISINOPRIL-HYDROCHLOROTHIAZIDE 20-12.5 MG TABS (LISINOPRIL-HYDROCHLOROTHIAZIDE) 1 by mouth once daily  #90 x 3   Entered by:   Romualdo Bolk, CMA   Authorized by:   Madelin Headings MD   Signed by:   Madelin Headings MD on 03/26/2008   Method used:   Electronically to        [facility]* (mail-order)             ,          Ph: [phone]       Fax: [phone]   RxID:   [id number] KLOR-CON M20 20 MEQ TBCR (POTASSIUM CHLORIDE CRYS CR) 1 by mouth once daily  #90 x 3   Entered by:   Romualdo Bolk, CMA   Authorized by:   Madelin Headings MD   Signed by:   Madelin Headings MD on 03/26/2008   Method used:   Electronically to        [facility]* (mail-order)             ,          Ph: [phone]       Fax: [phone]   RxID:   [id number]    Tetanus/Td Immunization History:    Tetanus/Td # 1:  Historical (07/21/2001)  Pneumovax Immunization History:  Pneumovax # 1:  Historical (03/06/2003)

## 2010-04-04 NOTE — Letter (Signed)
Summary: Generic Letter  [place] at [facility]  [address] [place], [place] [address]   Phone: [phone]  Fax: [phone]    05/10/2008  [facility] [address] [place], [place]  [address]  Dear Ms. Brickell,  Your stool samples were negative for blood. If you have any other questions, please call me at [phone].         Sincerely,   Tor Netters, CMA [place] at [facility]

## 2010-04-04 NOTE — Procedures (Signed)
Summary: IRON DEF. ANEMIA                  [facility]  Patient here today for capsule endoscopy for Dr. Arlyce Dice .  Pt verbalized understanding of all verbal and written instructions.  Pt tolerated well.  Lot #  [id number]  exp 2011-08 .

## 2010-04-04 NOTE — Letter (Signed)
Summary: Appointment Reminder  [facility]  [address] [place], [place] [address]   Phone: [phone]  Fax: [phone]        December 10, 2008 MRN: [id number]    [facility] [address] [place], [place]  [address]    Dear Ms. Canby,   We have been unable to reach you by phone to schedule a follow up   appointment that was recommended for you by Dr. Arlyce Dice. We have   scheduled your follow up appointment for 01/05/2009 at 8:45AM. You will   need to arrive 15 minutes prior to appointment, bring co-pay and   medications with you to your appointment.   We hope that you allow [place] to participate in your health care needs.   Please contact us at allow [place] to participate in your health care needs.    Call if you have any questions   [phone]    Sincerely,    Merri Ray CMA (AAMA)

## 2010-04-04 NOTE — Letter (Signed)
Summary: [facility]  [facility]   Imported By: Maryln Gottron 07/16/2008 13:31:42  _____________________________________________________________________  External Attachment:    Type:   Image     Comment:   External Document

## 2010-04-04 NOTE — Procedures (Signed)
Summary: Upper Endoscopy  Patient: Noble Spieth Note: All result statuses are Final unless otherwise noted.  Tests: (1) Upper Endoscopy (EGD)   EGD Upper Endoscopy       DONE     [facility]     [address] [place], [place]  [address]           ENDOSCOPY PROCEDURE REPORT           PATIENT:  Ximena, Todaro  MR#:  [id number]     BIRTHDATE:  1930/12/04, 78 yrs. old  GENDER:  female           ENDOSCOPIST:  Barbette Hair. Arlyce Dice, MD     Referred by:           PROCEDURE DATE:  01/12/2009     PROCEDURE:  EGD with enteroscopy, EGD with lesion ablation     ASA CLASS:  Class II     INDICATIONS:  iron deficiency anemia Capsule study shows small     bowel AVMs           MEDICATIONS:   Fentanyl 50 mcg, Versed 5 mg, glycopyrrolate     (Robinal) 0.2 mg     TOPICAL ANESTHETIC:  Cetacaine Spray           DESCRIPTION OF PROCEDURE:   After the risks benefits and     alternatives of the procedure were thoroughly explained, informed     consent was obtained.  The [id number] ([id number]) endoscope was     introduced through the mouth and advanced to the proximal jejunum,     without limitations.  The instrument was slowly withdrawn as the     mucosa was fully examined.     <<PROCEDUREIMAGES>>           AVMs  in the second portion of the duodenum (see image001 and     image002). 3-4 1mm AVMs - obliterated with the APC  A hiatal     hernia was found.  Otherwise the examination was normal.     Retroflexed views revealed no abnormalities.    The scope was then     withdrawn from the patient and the procedure completed.           COMPLICATIONS:  None           ENDOSCOPIC IMPRESSION:     1) AVM in the second portion duodenum - s/p obliteration with     the argon plasma coagulator     2) Hiatal hernia     3) Otherwise normal examination     RECOMMENDATIONS:     1) continue iron supplements     2) My office will arrange for you to have labs checked (CBC) in     3 months     3)  Call office next 2-3 days to schedule an office appointment     for 3 months           REPEAT EXAM:  No           ______________________________     Barbette Hair. Arlyce Dice, MD           CC:  Madelin Headings, MD           n.     Rosalie DoctorBarbette Hair. Kandra Graven at 01/12/2009 01:06 PM           [place], [place], [phone]  Note: An exclamation mark (!) indicates a result that was  not dispersed into the flowsheet. Document Creation Date: 01/12/2009 1:06 PM _______________________________________________________________________  (1) Order result status: Final Collection or observation date-time: 01/12/2009 13:00 Requested date-time:  Receipt date-time:  Reported date-time:  Referring Physician:   Ordering Physician: Melvia Heaps [phone]) Specimen Source:  Source: Launa Grill Order Number: [id number] Lab site:   Appended Document: Upper Endoscopy Pt. is scheduled for a labs 04-04-09 and OV on 04-07-09. Pt. instructed to call back as needed.

## 2010-04-04 NOTE — Letter (Signed)
Summary: Appt Reminder 2  [facility]  [address] [place], [place] [address]   Phone: [phone]  Fax: [phone]        February 07, 2009 MRN: [id number]    [facility] [address] [place], [place]  [address]    Dear Ms. Concepcion,   You have a return appointment with Dr.Robert Arlyce Dice on 04-07-09 at 8:45am. Please have your labs drawn around 04-04-2009, here in the lab. Please remember to bring a complete list of the medicines you are taking, your insurance card and your co-pay.  If you have to cancel or reschedule this appointment, please call before 5:00 pm the evening before to avoid a cancellation fee.  If you have any questions or concerns, please call [phone].    Sincerely,    Laureen Ochs LPN  Appended Document: Appt Reminder 2 Letter mailed to patient.

## 2010-04-04 NOTE — Procedures (Signed)
Summary: Instruction for procedure/[facility] (Out pt)  Instruction for procedure/[facility] (Out pt)   Imported By: Sherian Rein 01/07/2009 10:11:07  _____________________________________________________________________  External Attachment:    Type:   Image     Comment:   External Document

## 2010-04-04 NOTE — Progress Notes (Signed)
Summary: vesicare  Phone Note Call from Patient Call back at [phone]   Caller: live Call For: panosh Summary of Call: [facility] wants to change Vesicare to Oxybutynin as cost savings for me.  If OK, please send new Rx for it to [facility].   Initial call taken by: Rudy Jew, RN,  October 12, 2008 11:01 AM  Follow-up for Phone Call        Pt aware that md is out until tues and she can wait until next week.  Follow-up by: Romualdo Bolk, CMA Duncan Dull),  October 12, 2008 12:46 PM  Additional Follow-up for Phone Call Additional follow up Details #1::        oxybutinin can be tried .  but has higher side effect incidence of dry mouth and constipation can send 10 mg x l 1 by mouth once daily for 90 days  refill x 1  Additional Follow-up by: Madelin Headings MD,  October 20, 2008 5:33 PM    Additional Follow-up for Phone Call Additional follow up Details #2::    Pt waa to stay on vesicare due to side effects with oxybutin. Romualdo Bolk, CMA (AAMA)  October 21, 2008 8:49 AM   New/Updated Medications: OXYBUTYNIN CHLORIDE 10 MG XR24H-TAB (OXYBUTYNIN CHLORIDE)

## 2010-04-04 NOTE — Progress Notes (Signed)
Summary: Schedule follow up   ---- Converted from flag ---- ---- 12/08/2008 8:30 AM, Louis Meckel MD wrote: please schedule f/u app't ------------------------------  Phone Note Outgoing Call Call back at [facility] Phone [phone]   Call placed by: Merri Ray CMA Duncan Dull),  December 08, 2008 2:37 PM Summary of Call: Called pt to schedule her a follow up appointment. L/M for her to R/C Initial call taken by: Merri Ray CMA Duncan Dull),  December 08, 2008 2:38 PM  Follow-up for Phone Call        tried to call pt no answer will  schedule follow up appointment and mail her an appointment letter Follow-up by: Merri Ray CMA (AAMA),  December 10, 2008 8:10 AM

## 2010-04-04 NOTE — Progress Notes (Signed)
Summary: REFILLS  Phone Note From Pharmacy   Caller: [facility] Reason for Call: Needs renewal Details for Reason: SIMVASTATIN, AMLODIPINE, LISINOPRIL/HCTZ,OMEPRAZOLE, AND VESICARE Initial call taken by: Romualdo Bolk, CMA (AAMA),  May 31, 2009 11:11 AM  Follow-up for Phone Call        Pt needs a follow up appt before next refill. LMTOCB. Follow-up by: Romualdo Bolk, CMA Duncan Dull),  May 31, 2009 3:07 PM  Additional Follow-up for Phone Call Additional follow up Details #1::        Pt aware and will call back to schedule an appt. Additional Follow-up by: Romualdo Bolk, CMA (AAMA),  May 31, 2009 5:00 PM    Prescriptions: VESICARE 5 MG  TABS (SOLIFENACIN SUCCINATE) 1 by mouth once daily  #90 x 0   Entered by:   Romualdo Bolk, CMA (AAMA)   Authorized by:   Madelin Headings MD   Signed by:   Romualdo Bolk, CMA (AAMA) on 05/31/2009   Method used:   Electronically to        [facility]* (mail-order)             ,          Ph: [phone]       Fax: [phone]   RxID:   [id number] SIMVASTATIN 20 MG TABS (SIMVASTATIN) Take 1 tablet by mouth once a day  #90 x 0   Entered by:   Romualdo Bolk, CMA (AAMA)   Authorized by:   Madelin Headings MD   Signed by:   Romualdo Bolk, CMA (AAMA) on 05/31/2009   Method used:   Electronically to        [facility] [facility]* (mail-order)             ,          Ph: [phone]       Fax: [phone]   RxID:   [id number] OMEPRAZOLE 20 MG CPDR (OMEPRAZOLE) 1 by mouth once daily  #90 x 0   Entered by:   Romualdo Bolk, CMA (AAMA)   Authorized by:   Madelin Headings MD   Signed by:   Romualdo Bolk, CMA (AAMA) on 05/31/2009   Method used:   Electronically to        [facility] [facility] (mail-order)             ,          Ph: [phone]       Fax: [phone]   RxID:   [id number] NORVASC 5 MG TABS (AMLODIPINE BESYLATE) Take 1 tablet by mouth once a day  #90 x 0   Entered by:   Romualdo Bolk, CMA  (AAMA)   Authorized by:   Madelin Headings MD   Signed by:   Romualdo Bolk, CMA (AAMA) on 05/31/2009   Method used:   Electronically to        [facility] [employer] [facility]* (mail-order)             ,          Ph: [phone]       Fax: [phone]   RxID:   [id number] LISINOPRIL-HYDROCHLOROTHIAZIDE 20-12.5 MG TABS (LISINOPRIL-HYDROCHLOROTHIAZIDE) 1 by mouth once daily  #90 x 0   Entered by:   Romualdo Bolk, CMA (AAMA)   Authorized by:   Madelin Headings MD   Signed by:   Romualdo Bolk, CMA (AAMA) on 05/31/2009  Method used:   Electronically to        [facility]* (mail-order)             ,          Ph: [phone]       Fax: [phone]   RxID:   [id number]

## 2010-04-04 NOTE — Assessment & Plan Note (Signed)
Summary: follow up on anemia/ssc   Vital Signs:  Patient Profile:   77 Years Old Female Height:     61 inches Temp:     98.1 degrees F oral Pulse rate:   72 / minute BP sitting:   120 / 80  (left arm) Cuff size:   regular  Vitals Entered By: Romualdo Bolk, CMA (April 13, 2008 8:16 AM)                 Chief Complaint:  Follow up.  History of Present Illness: [name] is here for a follow up on labs. She was found to be anemic and has no hx of same .   NO bruisng bleeding blood in stool .   NO Gi bleeding vomiting     although no abd pain.     if goes off omeprazole      gets  problems   .    GI  .  reflux signs  .   Never had egd  last colon ? 2008 ? no problems.  SOB as per last visit and no change .    Does take asa.  ( NOt on her med list)     Prior Medications Reviewed Using: Patient Recall  Prior Medication List:  ADVAIR DISKUS 500-50 MCG/DOSE MISC (FLUTICASONE-SALMETEROL)  FEXOFENADINE HCL 180 MG TABS (FEXOFENADINE HCL) 1 by mouth once daily KLOR-CON M20 20 MEQ TBCR (POTASSIUM CHLORIDE CRYS CR) 1 by mouth once daily LISINOPRIL-HYDROCHLOROTHIAZIDE 20-12.5 MG TABS (LISINOPRIL-HYDROCHLOROTHIAZIDE) 1 by mouth once daily NORVASC 5 MG TABS (AMLODIPINE BESYLATE) Take 1 tablet by mouth once a day OMEPRAZOLE 20 MG CPDR (OMEPRAZOLE) 1 by mouth once daily PATANOL 0.1 % SOLN (OLOPATADINE HCL)  SIMVASTATIN 20 MG TABS (SIMVASTATIN) Take 1 tablet by mouth once a day SINGULAIR 10 MG TABS (MONTELUKAST SODIUM)  VESICARE 5 MG  TABS (SOLIFENACIN SUCCINATE) 1 by mouth once daily   Current Allergies (reviewed today): No known allergies   Past Medical History:    Allergic rhinitis    Asthma    GERD    Hyperlipidemia    Hypertension   stress test 2009 saw Dr Daleen Squibb.    Osteoarthritis              LAST    Mammogram: 12/25/2007    Pap: 2004    Td: 2003    Colonscopy: 2008    EKG: 2008    Dexa: 2008    Eye Exam: 04/2007    Other: pneumovax 2005    Smoking:  Former         Consults:    Dr. Arlyce Dice    Dr. Daleen Squibb    Dr. Millston Callas   Family History:    Father: unsure    Mother:  breast cancer, ovarian cancer  ? anemia     Siblings: Only child    Social History:    widowed 2001    Former Smoker    HH of1     active in Brewing technologist activities.         Review of Systems       The patient complains of dyspnea on exertion.  The patient denies anorexia, fever, weight loss, chest pain, syncope, prolonged cough, abdominal pain, melena, hematochezia, depression, unusual weight change, abnormal bleeding, enlarged lymph nodes, and angioedema.     Physical Exam  General:     alert, well-developed, well-nourished, and well-hydrated.   Additional Exam:     see labs  Impression & Recommendations:  Problem # 1:  UNSPECIFIED ANEMIA (ICD-285.9) Assessment: New Stool cards given .  appears to  be iron deficient by    incces and review of last hg a year ago was 12 .  Doesnt really want to go back to gi  for eval right now.   disc diff dx of new onset anemia in her age group.   UGI problem possible also  .  Orders: TLB-CBC Platelet - w/Differential (85025-CBCD) TLB-Ferritin (82728-FER) TLB-B12 + Folate Pnl (16109_60454-U98/JXB)   Problem # 2:  VITAMIN D DEFICIENCY (ICD-268.9) Assessment: New disc and will rx with  high dose ok to take otc multivits.   Problem # 3:  DYSPNEA ON EXERTION (ICD-786.09) Assessment: Comment Only ? combo cause  will fo ahead and do echo . anemia can be contributing but unsure how  much.   Her updated medication list for this problem includes:    Advair Diskus 500-50 Mcg/dose Misc (Fluticasone-salmeterol)    Lisinopril-hydrochlorothiazide 20-12.5 Mg Tabs (Lisinopril-hydrochlorothiazide) .Marland Kitchen... 1 by mouth once daily    Singulair 10 Mg Tabs (Montelukast sodium)  Orders: Cardiology Referral (Cardiology)   Problem # 4:  GERD (ICD-530.81) continue on meds for now  and stop asa  Her updated medication list  for this problem includes:    Omeprazole 20 Mg Cpdr (Omeprazole) .Marland Kitchen... 1 by mouth once daily   Complete Medication List: 1)  Advair Diskus 500-50 Mcg/dose Misc (Fluticasone-salmeterol) 2)  Fexofenadine Hcl 180 Mg Tabs (Fexofenadine hcl) .Marland Kitchen.. 1 by mouth once daily 3)  Klor-con M20 20 Meq Tbcr (Potassium chloride crys cr) .Marland Kitchen.. 1 by mouth once daily 4)  Lisinopril-hydrochlorothiazide 20-12.5 Mg Tabs (Lisinopril-hydrochlorothiazide) .Marland Kitchen.. 1 by mouth once daily 5)  Norvasc 5 Mg Tabs (Amlodipine besylate) .... Take 1 tablet by mouth once a day 6)  Omeprazole 20 Mg Cpdr (Omeprazole) .Marland Kitchen.. 1 by mouth once daily 7)  Patanol 0.1 % Soln (Olopatadine hcl) 8)  Simvastatin 20 Mg Tabs (Simvastatin) .... Take 1 tablet by mouth once a day 9)  Singulair 10 Mg Tabs (Montelukast sodium) 10)  Vesicare 5 Mg Tabs (Solifenacin succinate) .Marland Kitchen.. 1 by mouth once daily 11)  Vitamin D 14782 Unit Caps (Ergocalciferol) .Marland Kitchen.. 1 by mouth q week   Patient Instructions: 1)  take iron  2)  add vit d prescription. 3)  Will call about results . of lab s.  4)    5)  Stop asa  6)  Will call about echo test.  7)  Please schedule a follow-up appointment in 1 month. 50% of visit spent in counseling  counseled and reviewed data 25 minutes   Prescriptions: VITAMIN D 95621 UNIT CAPS (ERGOCALCIFEROL) 1 by mouth q week  #12 x 3   Entered and Authorized by:   Madelin Headings MD   Signed by:   Madelin Headings MD on 04/13/2008   Method used:   Electronically to        [facility]* (mail-order)             ,          Ph: [phone]       Fax: [phone]   RxID:   [id number]

## 2010-04-04 NOTE — Assessment & Plan Note (Signed)
Summary: flu shot/njr   Nurse Visit Flu Vaccine Consent Questions     Do you have a history of severe allergic reactions to this vaccine? no    Any prior history of allergic reactions to egg and/or gelatin? no    Do you have a sensitivity to the preservative Thimersol? no    Do you have a past history of Guillan-Barre Syndrome? no    Do you currently have an acute febrile illness? no    Have you ever had a severe reaction to latex? no    Vaccine information given and explained to patient? yes    Are you currently pregnant? no    Lot Number:[id number]   Site Given  Left Deltoid IM Romualdo Bolk, CMA  November 25, 2007 11:46 AM     Prior Medications: ADVAIR DISKUS 500-50 MCG/DOSE MISC (FLUTICASONE-SALMETEROL)  FEXOFENADINE HCL 180 MG TABS (FEXOFENADINE HCL) 1 by mouth once daily KLOR-CON M20 20 MEQ TBCR (POTASSIUM CHLORIDE CRYS CR) 1 by mouth once daily LISINOPRIL-HYDROCHLOROTHIAZIDE 20-12.5 MG TABS (LISINOPRIL-HYDROCHLOROTHIAZIDE) 1 by mouth once daily NORVASC 5 MG TABS (AMLODIPINE BESYLATE) Take 1 tablet by mouth once a day OMEPRAZOLE 20 MG CPDR (OMEPRAZOLE) 1 by mouth once daily PATANOL 0.1 % SOLN (OLOPATADINE HCL)  SIMVASTATIN 20 MG TABS (SIMVASTATIN) Take 1 tablet by mouth once a day SINGULAIR 10 MG TABS (MONTELUKAST SODIUM)  VESICARE 5 MG  TABS (SOLIFENACIN SUCCINATE) 1 by mouth once daily Current Allergies: No known allergies     Orders Added: 1)  Flu Vaccine 33yrs + [84696] 2)  Admin of Therapeutic Inj (IM or ) Lepidus.Putnam    ]

## 2010-04-04 NOTE — Procedures (Signed)
Summary: Endoscopy   EGD  Procedure date:  11/10/2008  Findings:      Location: [facility]   ENDOSCOPY PROCEDURE REPORT  PATIENT:  Heather, [name]  MR#:  [id number] BIRTHDATE:   04/11/1930, 78 yrs. old   GENDER:   female  ENDOSCOPIST:   Barbette Hair. Arlyce Dice, MD Referred by:   PROCEDURE DATE:  11/10/2008 PROCEDURE:  EGD, diagnostic ASA CLASS:   Class II INDICATIONS:   MEDICATIONS:    Fentanyl 50 mcg IV, Versed 5 mg IV, glycopyrrolate (Robinal) 0.2 mg IV, 0.6cc simethancone 0.6 cc PO TOPICAL ANESTHETIC:   Exactacain Spray  DESCRIPTION OF PROCEDURE:   After the risks benefits and alternatives of the procedure were thoroughly explained, informed consent was obtained.  The LB [id number] endoscope was introduced through the mouth and advanced to the third portion of the duodenum, without limitations.  The instrument was slowly withdrawn as the mucosa was fully examined. <<PROCEDUREIMAGES>>                <<OLD IMAGES>>  A hiatal hernia was found. 6-7cm sliding hiatal hernia  Otherwise the examination was normal (see image1, image2, image3, image4, image5, image6, image7, image8, and image10).    Retroflexed views revealed no abnormalities.    The scope was then withdrawn from the patient and the procedure completed.  COMPLICATIONS:   None  ENDOSCOPIC IMPRESSION:  1) Hiatal hernia  2) Otherwise normal examination RECOMMENDATIONS:  1) capsule endoscopy  REPEAT EXAM:   No   _______________________________ Barbette Hair. Arlyce Dice, MD    CC: Madelin Headings, MD

## 2010-04-04 NOTE — Miscellaneous (Signed)
Summary: [facility] Previsit/prep  Clinical Lists Changes  Observations: Added new observation of ALLERGY REV: Done (11/03/2008 10:47) Added new observation of NKA: T (11/03/2008 10:47)

## 2010-04-04 NOTE — Assessment & Plan Note (Signed)
Summary: INTERMITTENT CHEST PAINS/NN   Vital Signs:  Patient Profile:   77 Years Old Female Weight:      178 pounds Pulse rate:   66 / minute BP sitting:   140 / 80  (left arm) Cuff size:   regular  Vitals Entered By: Romualdo Bolk, CMA (December 06, 2006 12:56 PM)                 Chief Complaint:  Chest movement in left chest area, not sharp, no sob, no numbness or tingling down arms and legs, and started 12/04/06.  History of Present Illness: [name] comes in today for an acute visit for light shooting discomfort on lower mid to left chest  for the past 3 days   No pain but grabbing feeling  lasting only seconds. No SOB no cp  cough.  no nocturnal symptom and not related to exercise or  no diaphoresis and doen' feel bad with thsi. Come to be sure it is not a serious cause. No recent asthma flare    Had cardiolite 2004 low risk study HT has been controlled. Needs refill of  meds.     Hypertension History:      Positive major cardiovascular risk factors include female age 68 years old or older, hyperlipidemia, and hypertension.  Negative major cardiovascular risk factors include non-tobacco-user status.     Current Allergies (reviewed today): No known allergies   Past Medical History:    Reviewed history and no changes required:       Allergic rhinitis       Asthma       GERD       Hyperlipidemia       Hypertension       Osteoarthritis  Past Surgical History:    Rt hip replacement   Social History:    widowed    Never Smoked   Risk Factors:  Tobacco use:  never   Review of Systems       see hpi   Physical Exam  General:     alert, well-developed, well-nourished, and well-hydrated.   Neck:     No deformities, masses, or tenderness noted. Chest Wall:     no deformities, no tenderness, and no mass.   Breasts:     No mass, nodules, thickening, tenderness, bulging, retraction, inflamation, nipple discharge or skin changes noted.     Lungs:     Normal respiratory effort, chest expands symmetrically. Lungs are clear to auscultation, no crackles or wheezes. Heart:     Normal rate and regular rhythm. S1 and S2 normal without gallop, murmur, click, rub or other extra sounds. Abdomen:     Bowel sounds positive,abdomen soft and non-tender without masses, organomegaly or hernias noted. Neurologic:     grossly normal  Cervical Nodes:     No lymphadenopathy noted Psych:     good eye contact, not anxious appearing, and not depressed appearing.   Additional Exam:     nsp changes ,no change from 4 years ago.    Impression & Recommendations:  Problem # 1:  CHEST DISCOMFORT, ATYPICAL (ICD-786.59) doesn' sound like cardiac symptom as it only lasts seconds and no assoc symptom or pain.  ? if cw ? othre.  Reviewed low threshold to call back if increasing worse assoc symptom etc.  because she does have rsik factors Orders: EKG w/ Interpretation (93000)   Problem # 2:  HYPERLIPIDEMIA (ICD-272.4)  Her updated medication list for this problem  includes:    Simvastatin 20 Mg Tabs (Simvastatin) .Marland Kitchen... Take 1 tablet by mouth once a day  Orders: EKG w/ Interpretation (93000)   Problem # 3:  HYPERTENSION (ICD-401.9) lot [id number], EXP 30 jun 09, sanofi pasteur left deltoid IM, 0.5 cc.  Her updated medication list for this problem includes:    Lisinopril-hydrochlorothiazide 20-12.5 Mg Tabs (Lisinopril-hydrochlorothiazide) .Marland Kitchen... 1 by mouth once daily    Norvasc 5 Mg Tabs (Amlodipine besylate) .Marland Kitchen... Take 1 tablet by mouth once a day  Orders: EKG w/ Interpretation (93000)   Complete Medication List: 1)  Advair Diskus 500-50 Mcg/dose Misc (Fluticasone-salmeterol) 2)  Fexofenadine Hcl 180 Mg Tabs (Fexofenadine hcl) .Marland Kitchen.. 1 by mouth once daily 3)  Klor-con M20 20 Meq Tbcr (Potassium chloride crys cr) .Marland Kitchen.. 1 by mouth once daily 4)  Lisinopril-hydrochlorothiazide 20-12.5 Mg Tabs (Lisinopril-hydrochlorothiazide) .Marland Kitchen.. 1 by mouth once  daily 5)  Norvasc 5 Mg Tabs (Amlodipine besylate) .... Take 1 tablet by mouth once a day 6)  Omeprazole 20 Mg Cpdr (Omeprazole) .Marland Kitchen.. 1 by mouth once daily 7)  Patanol 0.1 % Soln (Olopatadine hcl) 8)  Simvastatin 20 Mg Tabs (Simvastatin) .... Take 1 tablet by mouth once a day 9)  Singulair 10 Mg Tabs (Montelukast sodium) 10)  Vesicare 5 Mg Tabs (Solifenacin succinate) .Marland Kitchen.. 1 by mouth once daily  Other Orders: Flu Vaccine 105yrs + (65784) Admin 1st Vaccine (69629)  Hypertension Assessment/Plan:      The patient's hypertensive risk group is category B: At least one risk factor (excluding diabetes) with no target organ damage.  Today's blood pressure is 140/80.       Prescriptions: LISINOPRIL-HYDROCHLOROTHIAZIDE 20-12.5 MG TABS (LISINOPRIL-HYDROCHLOROTHIAZIDE) 1 by mouth once daily  #90 x 3   Entered and Authorized by:   Madelin Headings MD   Signed by:   Madelin Headings MD on 12/06/2006   Method used:   Print then Give to Patient   RxID:   [id number] NORVASC 5 MG TABS (AMLODIPINE BESYLATE) Take 1 tablet by mouth once a day  #90 x 3   Entered and Authorized by:   Madelin Headings MD   Signed by:   Madelin Headings MD on 12/06/2006   Method used:   Print then Give to Patient   RxID:   [id number]  ]  Influenza Vaccine    Vaccine Type: Fluvax 3+    Given by: Raechel Ache, RN  Flu Vaccine Consent Questions    Do you have a history of severe allergic reactions to this vaccine? no    Any prior history of allergic reactions to egg and/or gelatin? no    Do you have a sensitivity to the preservative Thimersol? no    Do you have a past history of Guillan-Barre Syndrome? no    Do you currently have an acute febrile illness? no    Have you ever had a severe reaction to latex? no    Vaccine information given and explained to patient? yes    Are you currently pregnant? no

## 2010-04-04 NOTE — Progress Notes (Signed)
Summary: refill on vesicare and zocor  Medications Added ADVAIR DISKUS 500-50 MCG/DOSE MISC (FLUTICASONE-SALMETEROL)  ALREX 0.2 % SUSP (LOTEPREDNOL ETABONATE)  EPIPEN 2-PAK 0.3 MG/0.3ML (1:1000) DEVI (EPINEPHRINE HCL (ANAPHYLAXIS))  FEXOFENADINE HCL 180 MG TABS (FEXOFENADINE HCL)  KLOR-CON M20 20 MEQ TBCR (POTASSIUM CHLORIDE CRYS CR)  LISINOPRIL-HYDROCHLOROTHIAZIDE 20-12.5 MG TABS (LISINOPRIL-HYDROCHLOROTHIAZIDE)  NORVASC 5 MG TABS (AMLODIPINE BESYLATE) Take 1 tablet by mouth once a day OMEPRAZOLE 20 MG CPDR (OMEPRAZOLE)  PATANOL 0.1 % SOLN (OLOPATADINE HCL)  QUIXIN 0.5 % SOLN (LEVOFLOXACIN)  REGLAN 10 MG TABS (METOCLOPRAMIDE HCL)  SIMVASTATIN 20 MG TABS (SIMVASTATIN) Take 1 tablet by mouth once a day SINGULAIR 10 MG TABS (MONTELUKAST SODIUM)  ZADITOR 0.025 % SOLN (KETOTIFEN FUMARATE)  VESICARE 5 MG  TABS (SOLIFENACIN SUCCINATE) 1 by mouth once daily      Allergies Added: NKDA Phone Note Call from Patient   Caller: Patient Reason for Call: Refill Medication Summary of Call: Pt needs rx refill on Vesicare and Simvvasatin sent to [facility]. Initial call taken by: Romualdo Bolk, CMA,  October 10, 2006 9:00 AM  Follow-up for Phone Call        Faxed to [facility]. 90 days w/ 3 refills Follow-up by: Romualdo Bolk, CMA,  October 10, 2006 9:02 AM    New/Updated Medications: ADVAIR DISKUS 500-50 MCG/DOSE MISC (FLUTICASONE-SALMETEROL)  ALREX 0.2 % SUSP (LOTEPREDNOL ETABONATE)  EPIPEN 2-PAK 0.3 MG/0.3ML (1:1000) DEVI (EPINEPHRINE HCL (ANAPHYLAXIS))  FEXOFENADINE HCL 180 MG TABS (FEXOFENADINE HCL)  KLOR-CON M20 20 MEQ TBCR (POTASSIUM CHLORIDE CRYS CR)  LISINOPRIL-HYDROCHLOROTHIAZIDE 20-12.5 MG TABS (LISINOPRIL-HYDROCHLOROTHIAZIDE)  NORVASC 5 MG TABS (AMLODIPINE BESYLATE) Take 1 tablet by mouth once a day OMEPRAZOLE 20 MG CPDR (OMEPRAZOLE)  PATANOL 0.1 % SOLN (OLOPATADINE HCL)  QUIXIN 0.5 % SOLN (LEVOFLOXACIN)  REGLAN 10 MG TABS (METOCLOPRAMIDE HCL)  SIMVASTATIN 20 MG TABS  (SIMVASTATIN) Take 1 tablet by mouth once a day SINGULAIR 10 MG TABS (MONTELUKAST SODIUM)  ZADITOR 0.025 % SOLN (KETOTIFEN FUMARATE)  VESICARE 5 MG  TABS (SOLIFENACIN SUCCINATE) 1 by mouth once daily   Prescriptions: VESICARE 5 MG  TABS (SOLIFENACIN SUCCINATE) 1 by mouth once daily  #90 x 3   Entered and Authorized by:   Romualdo Bolk, CMA   Signed by:   Romualdo Bolk, CMA on 10/10/2006   Method used:   Printed then faxed to ...       [facility]             , [place]    [place]       Ph:        Fax: [phone]   RxID:   [id number] SIMVASTATIN 20 MG TABS (SIMVASTATIN) Take 1 tablet by mouth once a day  #90 x 3   Entered and Authorized by:   Romualdo Bolk, CMA   Signed by:   Romualdo Bolk, CMA on 10/10/2006   Method used:   Printed then faxed to ...       [facility]             ,     [place]       Ph:        Fax: [phone]   RxID:   [id number]

## 2010-04-04 NOTE — Progress Notes (Signed)
Summary: Lab results   Phone Note Call from Patient Call back at Home Phone [phone]   Call For: Dr Arlyce Dice Reason for Call: Lab or Test Results Initial call taken by: Leanor Kail [facility],  Jul 27, 2009 8:42 AM  Follow-up for Phone Call        Message left for patient that the CBC done 07-07-09 is normal. Pt. instructed to call back as needed.  Follow-up by: Laureen Ochs LPN,  Jul 27, 2009 2:40 PM

## 2010-04-04 NOTE — Progress Notes (Signed)
Summary: mail order new rx  Phone Note Call from Patient Call back at [phone]   Caller: patient live Call For: Panosh Summary of Call: Patient needs a new rx omeprazole #[id number] mail order pharmacy.  please call when read, pharmacy is faxing an order to renew this rx. Initial call taken by: Celine Ahr,  September 19, 2007 11:25 AM  Follow-up for Phone Call        Sent rx via electronic fax   Follow-up by: Romualdo Bolk, CMA,  September 19, 2007 1:00 PM      Prescriptions: OMEPRAZOLE 20 MG CPDR (OMEPRAZOLE) 1 by mouth once daily  #90 x 2   Entered by:   Romualdo Bolk, CMA   Authorized by:   Madelin Headings MD   Signed by:   Romualdo Bolk, CMA on 09/19/2007   Method used:   Faxed to ...       [facility]             , [place]         Ph:        Fax: [phone]   RxID:   [id number]

## 2010-04-04 NOTE — Assessment & Plan Note (Signed)
Summary: 2 month follow up/cjr---PT [facility] // RS   Vital Signs:  Patient profile:   77 year old female Menstrual status:  postmenopausal Weight:      165 pounds Pulse rate:   72 / minute BP sitting:   140 / 80  (left arm) Cuff size:   regular  Vitals Entered By: Romualdo Bolk, CMA (AAMA) (November 29, 2009 8:08 AM) CC: Follow-up visit on labs, Hypertension Management   History of Present Illness: [name] comes in today   follow up of medical i ssues : Since lasat visist she has had no major changes  in her health status appettite down some eating  but no nvd  Anemia: taking  iron once a day  no bleeding  BP good  Resp stable  EDEMA : still a problem and has been to may specialists    onset in her 30 s.    was using sticlings at night  and not day and   cutting into areas.    dedema better in am  more when up   never resoned to diuretics .  Vit d   taking weekly   DJD knees hurt   no change  LIPIDS: no myalgia      Hypertension History:      She complains of peripheral edema, but denies headache, chest pain, palpitations, dyspnea with exertion, orthopnea, PND, visual symptoms, neurologic problems, syncope, and side effects from treatment.  She notes no problems with any antihypertensive medication side effects.        Positive major cardiovascular risk factors include female age 62 years old or older, hyperlipidemia, and hypertension.  Negative major cardiovascular risk factors include non-tobacco-user status.     Preventive Screening-Counseling & Management  Alcohol-Tobacco     Alcohol drinks/day: 0     Smoking Status: quit     Year Quit: 30 years ago  Caffeine-Diet-Exercise     Caffeine use/day: 2     Does Patient Exercise: no  Current Medications (verified): 1)  Symbicort 160-4.5 Mcg/act Aero (Budesonide-Formoterol Fumarate) 2)  Fexofenadine Hcl 180 Mg Tabs (Fexofenadine Hcl) .Marland Kitchen.. 1 By Mouth Once Daily 3)  Lisinopril-Hydrochlorothiazide 20-12.5 Mg Tabs  (Lisinopril-Hydrochlorothiazide) .Marland Kitchen.. 1 By Mouth Once Daily 4)  Norvasc 5 Mg Tabs (Amlodipine Besylate) .... Take 1 Tablet By Mouth Once A Day 5)  Omeprazole 20 Mg Cpdr (Omeprazole) .Marland Kitchen.. 1 By Mouth Once Daily 6)  Patanol 0.1 % Soln (Olopatadine Hcl) .... As Needed 7)  Simvastatin 20 Mg Tabs (Simvastatin) .... Take 1 Tablet By Mouth Once A Day 8)  Singulair 10 Mg Tabs (Montelukast Sodium) .... As Needed 9)  Vesicare 5 Mg  Tabs (Solifenacin Succinate) .Marland Kitchen.. 1 By Mouth Once Daily 10)  Vitamin D 24401 Unit Caps (Ergocalciferol) .Marland Kitchen.. 1 By Mouth Q Week 11)  Feosol 200 (65 Fe) Mg Tabs (Ferrous Sulfate Dried) .... Take One Tab Daily 12)  Klor-Con M20 20 Meq Cr-Tabs (Potassium Chloride Crys Cr) .Marland Kitchen.. 1 By Mouth Once Daily  Allergies (verified): No Known Drug Allergies  Past History:  Past medical, surgical, family and social histories (including risk factors) reviewed, and no changes noted (except as noted below).  Past Medical History: Reviewed history from 08/26/2009 and no changes required. Allergic rhinitis Asthma GERD Hyperlipidemia Hypertension   stress test 2009 saw Dr Daleen Squibb. Osteoarthritis Iron deficincy anemia from AVM duodenum  rx with  ablation. 2011 PFTS 2010 " nl" Echo 2010 nl lv  Stress adenosine  neg for ischemia  LAST Mammogram: 12/25/2007 Pap: 2004 Td: 2003 Colonscopy: 2008 EKG: 2008 Dexa: 2008 Eye Exam: 04/2007 Other: pneumovax 2005 Smoking: Former   Consults: Dr. Arlyce Dice Dr. Daleen Squibb Dr. [place] Callas  Past Surgical History: Reviewed history from 08/26/2009 and no changes required. Rt hip replacement 1/06 Laryngectomy duodenal avm ablation  Past History:  Care Management: Allergy: Dr. [place] Callas Cardiology: Dr. Daleen Squibb Gastroenterology: Dr. Arlyce Dice  Family History: Reviewed history from 05/24/2008 and no changes required. Father: unsure Mother:  breast cancer, ovarian cancer  ? anemia  Siblings: Only child    anemia ? runs in the family  Social  History: Reviewed history from 07/26/2008 and no changes required. 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    Review of Systems       The patient complains of peripheral edema.  The patient denies anorexia, fever, weight loss, vision loss, decreased hearing, prolonged cough, hematuria, transient blindness, unusual weight change, abnormal bleeding, enlarged lymph nodes, and angioedema.         asthma stable   Physical Exam  General:  Well-developed,well-nourished,in no acute distress; alert,appropriate and cooperative throughout examination Head:  normocephalic and atraumatic.   Neck:  No deformities, masses, or tenderness noted. Lungs:  Normal respiratory effort, chest expands symmetrically. Lungs are clear to auscultation, no crackles or wheezes. Heart:  Normal rate and regular rhythm. S1 and S2 normal without gallop, , click, rub or other extra sounds. Pulses:  pulses intact without delay   Extremities:  1+ left pedal edema and 1+ right pedal edema.   Neurologic:  alert & oriented X3.  non focal  Skin:  turgor normal, color normal, no ecchymoses, and no petechiae.   Cervical Nodes:  No lymphadenopathy noted Psych:  Oriented X3, memory intact for recent and remote, normally interactive, good eye contact, not anxious appearing, and not depressed appearing.     Impression & Recommendations:  Problem # 1:  ANEMIA, IRON DEFICIENCY (ICD-280.9) Assessment Improved still slightly low but much better  reviewed scenario again  and to build up iron stores .  Her updated medication list for this problem includes:    Feosol 200 (65 Fe) Mg Tabs (Ferrous sulfate dried) .Marland Kitchen... Take one tab daily  Problem # 2:  HYPERTENSION (ICD-401.9)  Her updated medication list for this problem includes:    Lisinopril-hydrochlorothiazide 20-12.5 Mg Tabs (Lisinopril-hydrochlorothiazide) .Marland Kitchen... 1 by mouth once daily    Norvasc 5 Mg Tabs (Amlodipine  besylate) .Marland Kitchen... Take 1 tablet by mouth once a day  BP today: 140/80 Prior BP: 124/80 (08/26/2009)  10 Yr Risk Heart Disease: 13 % Prior 10 Yr Risk Heart Disease: 7 % (05/24/2008)  Labs Reviewed: K+: 3.9 (10/27/2009) Creat: : 0.7 (10/27/2009)   Chol: 188 (10/27/2009)   HDL: 56.40 (10/27/2009)   LDL: 109 (10/27/2009)   TG: 112.0 (10/27/2009)  Problem # 3:  HYPERLIPIDEMIA (ICD-272.4) no hx of se with the amlodipine but disc with patient Her updated medication list for this problem includes:    Simvastatin 20 Mg Tabs (Simvastatin) .Marland Kitchen... Take 1 tablet by mouth once a day  Labs Reviewed: SGOT: 21 (10/27/2009)   SGPT: 13 (10/27/2009)  10 Yr Risk Heart Disease: 13 % Prior 10 Yr Risk Heart Disease: 7 % (05/24/2008)   HDL:56.40 (10/27/2009), 56.0 (03/26/2008)  LDL:109 (10/27/2009), 84 (52/84/1324)  Chol:188 (10/27/2009), 162 (03/26/2008)  Trig:112.0 (10/27/2009), 109 (03/26/2008)  Problem # 4:  EDEMA (ICD-782.3) le  for years and continuing    has had vascular eval  and other eval with neg dx and no improvment with diuretics  this may be lymphedema .  she is using comp stockings at night instead of day   disc  trying day use to help .   Her updated medication list for this problem includes:    Lisinopril-hydrochlorothiazide 20-12.5 Mg Tabs (Lisinopril-hydrochlorothiazide) .Marland Kitchen... 1 by mouth once daily  Problem # 5:  OVERACTIVE BLADDER (ICD-596.51) some help with meds   Problem # 6:  OSTEOARTHRITIS (ICD-715.90) ongoing   coping  Problem # 7:  GERD (ICD-530.81)  Her updated medication list for this problem includes:    Omeprazole 20 Mg Cpdr (Omeprazole) .Marland Kitchen... 1 by mouth once daily  Diagnostics Reviewed:  EGD: DONE (01/12/2009) D  Problem # 8:  VITAMIN D DEFICIENCY (ICD-268.9) seee level  take qo week and then recheck .  Complete Medication List: 1)  Symbicort 160-4.5 Mcg/act Aero (Budesonide-formoterol fumarate) 2)  Fexofenadine Hcl 180 Mg Tabs (Fexofenadine hcl) .Marland Kitchen.. 1 by mouth  once daily 3)  Lisinopril-hydrochlorothiazide 20-12.5 Mg Tabs (Lisinopril-hydrochlorothiazide) .Marland Kitchen.. 1 by mouth once daily 4)  Norvasc 5 Mg Tabs (Amlodipine besylate) .... Take 1 tablet by mouth once a day 5)  Omeprazole 20 Mg Cpdr (Omeprazole) .Marland Kitchen.. 1 by mouth once daily 6)  Patanol 0.1 % Soln (Olopatadine hcl) .... As needed 7)  Simvastatin 20 Mg Tabs (Simvastatin) .... Take 1 tablet by mouth once a day 8)  Singulair 10 Mg Tabs (Montelukast sodium) .... As needed 9)  Vesicare 5 Mg Tabs (Solifenacin succinate) .Marland Kitchen.. 1 by mouth once daily 10)  Vitamin D 13086 Unit Caps (Ergocalciferol) .Marland Kitchen.. 1 by mouth q week 11)  Feosol 200 (65 Fe) Mg Tabs (Ferrous sulfate dried) .... Take one tab daily 12)  Klor-con M20 20 Meq Cr-tabs (Potassium chloride crys cr) .Marland Kitchen.. 1 by mouth once daily  Hypertension Assessment/Plan:      The patient's hypertensive risk group is category B: At least one risk factor (excluding diabetes) with no target organ damage.  Her calculated 10 year risk of coronary heart disease is 13 %.  Today's blood pressure is 140/80.  Her blood pressure goal is < 140/90.  Patient Instructions: 1)  stay on same medication  except can do vit amin d every other week to see if level stays adequate.  2)  continue iron  for another 2 months  3)  If appetite problems become severe call for evaluation. 4)  Please schedule a follow-up appointment in 4 months .  5)  CBC diff and IBC panel  pre visit  dx iron deficiency . 6)  Vit d level before visit  dx vit d deficiency  Prescriptions: VITAMIN D 57846 UNIT CAPS (ERGOCALCIFEROL) 1 by mouth q week  #12 x 2   Entered by:   Romualdo Bolk, CMA (AAMA)   Authorized by:   Madelin Headings MD   Signed by:   Romualdo Bolk, CMA (AAMA) on 11/29/2009   Method used:   Electronically to        [facility]* (retail)             ,          Ph: [phone]       Fax: [phone]   RxID:   [id number] SIMVASTATIN 20 MG TABS (SIMVASTATIN) Take 1  tablet by mouth once a day  #90 x 3   Entered by:   Romualdo Bolk, CMA (AAMA)   Authorized by:   Madelin Headings MD  Signed by:   Romualdo Bolk, CMA (AAMA) on 11/29/2009   Method used:   Electronically to        [facility]* (retail)             ,          Ph: [phone]       Fax: [phone]   RxID:   [id number] OMEPRAZOLE 20 MG CPDR (OMEPRAZOLE) 1 by mouth once daily  #90 x 3   Entered by:   Romualdo Bolk, CMA (AAMA)   Authorized by:   Madelin Headings MD   Signed by:   Romualdo Bolk, CMA (AAMA) on 11/29/2009   Method used:   Electronically to        [facility]* (retail)             ,          Ph: [phone]       Fax: [phone]   RxID:   [id number] NORVASC 5 MG TABS (AMLODIPINE BESYLATE) Take 1 tablet by mouth once a day  #90 x 3   Entered by:   Romualdo Bolk, CMA (AAMA)   Authorized by:   Madelin Headings MD   Signed by:   Romualdo Bolk, CMA (AAMA) on 11/29/2009   Method used:   Electronically to        [facility]* (retail)             ,          Ph: [phone]       Fax: [phone]   RxID:   [id number] LISINOPRIL-HYDROCHLOROTHIAZIDE 20-12.5 MG TABS (LISINOPRIL-HYDROCHLOROTHIAZIDE) 1 by mouth once daily  #90 x 3   Entered by:   Romualdo Bolk, CMA (AAMA)   Authorized by:   Madelin Headings MD   Signed by:   Romualdo Bolk, CMA (AAMA) on 11/29/2009   Method used:   Electronically to        [facility]* (retail)             ,          Ph: [phone]       Fax: [phone]   RxID:   [id number] FEXOFENADINE HCL 180 MG TABS (FEXOFENADINE HCL) 1 by mouth once daily  #90 x 3   Entered by:   Romualdo Bolk, CMA (AAMA)   Authorized by:   Madelin Headings MD   Signed by:   Romualdo Bolk, CMA (AAMA) on 11/29/2009   Method used:   Electronically to        [facility]* (retail)             ,          Ph: [phone]       Fax: [phone]   RxID:   [id number]

## 2010-04-04 NOTE — Assessment & Plan Note (Signed)
Summary: ? virus//ccm   Vital Signs:  Patient profile:   77 year old female Menstrual status:  postmenopausal Height:      61 inches Weight:      160 pounds BMI:     30.34 Temp:     97.6 degrees F oral Pulse rate:   72 / minute BP sitting:   120 / 70  (left arm) Cuff size:   regular  Vitals Entered By: Romualdo Bolk, CMA (AAMA) (January 30, 2010 11:37 AM) CC: Burning upon urination last week, body aches, lower back pain but no abd. pain. No fever or chills. Pt states that today she is fine.    History of Present Illness: [name] comes in today  for sda  for  onset  6 days ago ofdysuria  and some frequency   .  Had body aches last week and took advil .   In october was sick and flu like illness.    that resolved without uti signs.    Sick  nausea and vomiting x 1 at thanksgiving with decrease appetite  but no real abd pain. .  This has been going off and on for a while  but never had vomiting..   Feels some better today but came in at request of her family .  Antibiotic  last monthtooth removed OCtober   given pcn.    ROS: no diarrhea cough congestion sob or cp. someweightloss  No cv  signs . some low back pain  no flank pain Last endo 04540 for avm and iron def anemia  and no ulcers or masses noted.  Never had [place] or other imaging.   feels bloated at times  Preventive Screening-Counseling & Management  Alcohol-Tobacco     Alcohol drinks/day: 0     Smoking Status: quit     Year Quit: 30 years ago  Caffeine-Diet-Exercise     Caffeine use/day: 2     Does Patient Exercise: no  Current Medications (verified): 1)  Symbicort 160-4.5 Mcg/act Aero (Budesonide-Formoterol Fumarate) 2)  Fexofenadine Hcl 180 Mg Tabs (Fexofenadine Hcl) .Marland Kitchen.. 1 By Mouth Once Daily 3)  Lisinopril-Hydrochlorothiazide 20-12.5 Mg Tabs (Lisinopril-Hydrochlorothiazide) .Marland Kitchen.. 1 By Mouth Once Daily 4)  Norvasc 5 Mg Tabs (Amlodipine Besylate) .... Take 1 Tablet By Mouth Once A Day 5)  Omeprazole 20  Mg Cpdr (Omeprazole) .Marland Kitchen.. 1 By Mouth Once Daily 6)  Patanol 0.1 % Soln (Olopatadine Hcl) .... As Needed 7)  Simvastatin 20 Mg Tabs (Simvastatin) .... Take 1 Tablet By Mouth Once A Day 8)  Singulair 10 Mg Tabs (Montelukast Sodium) .... As Needed 9)  Vesicare 5 Mg  Tabs (Solifenacin Succinate) .Marland Kitchen.. 1 By Mouth Once Daily 10)  Vitamin D 98119 Unit Caps (Ergocalciferol) .Marland Kitchen.. 1 By Mouth Q Week 11)  Feosol 200 (65 Fe) Mg Tabs (Ferrous Sulfate Dried) .... Take One Tab Daily 12)  Klor-Con M20 20 Meq Cr-Tabs (Potassium Chloride Crys Cr) .Marland Kitchen.. 1 By Mouth Once Daily  Allergies (verified): No Known Drug Allergies  Past History:  Past medical, surgical, family and social histories (including risk factors) reviewed, and no changes noted (except as noted below).  Past Medical History: Reviewed history from 08/26/2009 and no changes required. Allergic rhinitis Asthma GERD Hyperlipidemia Hypertension   stress test 2009 saw Dr Daleen Squibb. Osteoarthritis Iron deficincy anemia from AVM duodenum  rx with  ablation. 2011 PFTS 2010 " nl" Echo 2010 nl lv  Stress adenosine  neg for ischemia  LAST Mammogram: 12/25/2007 Pap: 2004 Td: 2003 Colonscopy: 2008 EKG: 2008 Dexa: 2008 Eye Exam: 04/2007 Other: pneumovax 2005 Smoking: Former   Consults: Dr. Arlyce Dice Dr. Daleen Squibb Dr. [place] Callas  Past Surgical History: Reviewed history from 08/26/2009 and no changes required. Rt hip replacement 1/06 Laryngectomy duodenal avm ablation  Past History:  Care Management: Allergy: Dr.  Callas Cardiology: Dr. Daleen Squibb Gastroenterology: Dr. Arlyce Dice  Family History: Reviewed history from 05/24/2008 and no changes required. Father: unsure Mother:  breast cancer, ovarian cancer  ? anemia  Siblings: Only child    anemia ? runs in the family  Social History: Reviewed history from 07/26/2008 and no changes required. 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    Review  of Systems       The patient complains of anorexia.  The patient denies fever.         see hpi  for rest of ROS  Physical Exam  General:  Well-developed,well-nourished,in no acute distress; alert,appropriate and cooperative throughout examination Head:  normocephalic and atraumatic.   Eyes:  clear Ears:  R ear normal, L ear normal, and no external deformities.   Nose:  no external deformity, no external erythema, and no nasal discharge.   Mouth:  pharynx pink and moist.   Neck:  No deformities, masses, or tenderness noted. shoddy nodes  Lungs:  Normal respiratory effort, chest expands symmetrically. Lungs are clear to auscultation, no crackles or wheezes. Heart:  Normal rate and regular rhythm. S1 and S2 normal without gallop, murmur, click, rub or other extra sounds. Abdomen:  Bowel sounds positive,abdomen soft and non-tender without masses, organomegaly or noted. Pulses:  nl cap refill  Extremities:  1+ left pedal edema and 1+ right pedal edema.   Neurologic:  non focal  exam  Skin:  turgor normal, color normal, no ecchymoses, and no petechiae.   Cervical Nodes:  No lymphadenopathy noted Psych:  Oriented X3, good eye contact, not anxious appearing, and not depressed appearing.     Impression & Recommendations:  Problem # 1:  DYSURIA (ICD-788.1) probable uti  Her updated medication list for this problem includes:    Vesicare 5 Mg Tabs (Solifenacin succinate) .Marland Kitchen... 1 by mouth once daily    Ciprofloxacin Hcl 500 Mg Tabs (Ciprofloxacin hcl) .Marland Kitchen... 1 by mouth two times a day for urin infection  Orders: UA Dipstick w/o Micro (automated)  (81003) T-Culture, Urine ([id number])  Problem # 2:  ANOREXIA (ICD-783.0) for a couple months?   had one vomiting episode last week  ? if could be related to uti. patient  thinks not.   Review of  gi eval for anemia did not include other imaging at the time.   / if iron causing a problem .  Will get US abdomen pelvis .  Orders: Radiology  Referral (Radiology)  Problem # 3:  ANEMIA, IRON DEFICIENCY (ICD-280.9) Assessment: Improved improvedsince definitive procedure. for AVM. Her updated medication list for this problem includes:    Feosol 200 (65 Fe) Mg Tabs (Ferrous sulfate dried) .Marland Kitchen... Take one tab daily  Complete Medication List: 1)  Symbicort 160-4.5 Mcg/act Aero (Budesonide-formoterol fumarate) 2)  Fexofenadine Hcl 180 Mg Tabs (Fexofenadine hcl) .Marland Kitchen.. 1 by mouth once daily 3)  Lisinopril-hydrochlorothiazide 20-12.5 Mg Tabs (Lisinopril-hydrochlorothiazide) .Marland Kitchen.. 1 by mouth once daily 4)  Norvasc 5 Mg Tabs (Amlodipine besylate) .... Take 1 tablet by mouth once a day 5)  Omeprazole 20 Mg Cpdr (Omeprazole) .Marland Kitchen.. 1 by mouth once daily 6)  Patanol 0.1 % Soln (Olopatadine hcl) .... As needed 7)  Simvastatin 20 Mg Tabs (Simvastatin) .... Take 1 tablet by mouth once a day 8)  Singulair 10 Mg Tabs (Montelukast sodium) .... As needed 9)  Vesicare 5 Mg Tabs (Solifenacin succinate) .Marland Kitchen.. 1 by mouth once daily 10)  Vitamin D 16109 Unit Caps (Ergocalciferol) .Marland Kitchen.. 1 by mouth q week 11)  Feosol 200 (65 Fe) Mg Tabs (Ferrous sulfate dried) .... Take one tab daily 12)  Klor-con M20 20 Meq Cr-tabs (Potassium chloride crys cr) .Marland Kitchen.. 1 by mouth once daily 13)  Ciprofloxacin Hcl 500 Mg Tabs (Ciprofloxacin hcl) .Marland Kitchen.. 1 by mouth two times a day for urin infection  Patient Instructions: 1)  take antibiotic  for probable UTI 2)  You will be informed of lab results when available.  3)  will get ultrasound of abdomen scheduled.  4)  the return office visit after this is done . to decide on further action evaluation.  5)  call in meantime if worse.  Prescriptions: CIPROFLOXACIN HCL 500 MG TABS (CIPROFLOXACIN HCL) 1 by mouth two times a day for urin infection  #10 x 1   Entered and Authorized by:   Madelin Headings MD   Signed by:   Madelin Headings MD on 01/30/2010   Method used:   Electronically to        [facility] [phone]* (retail)        [address]       [place], [place]  [address]       Ph: [phone]       Fax: [phone]   RxID:   [id number]    Orders Added: 1)  UA Dipstick w/o Micro (automated)  [81003] 2)  T-Culture, Urine [[id number]] 3)  Radiology Referral [Radiology] 4)  Est. Patient Level IV [34742]   calll home first   then cell [phone]  if in [place]  Laboratory Results   Urine Tests    Routine Urinalysis   Color: yellow Appearance: Clear Glucose: negative   (Normal Range: Negative) Bilirubin: 1+   (Normal Range: Negative) Ketone: trace (5)   (Normal Range: Negative) Spec. Gravity: 1.015   (Normal Range: 1.003-1.035) Blood: 1+   (Normal Range: Negative) pH: 7.0   (Normal Range: 5.0-8.0) Protein: 1+   (Normal Range: Negative) Urobilinogen: 1.0   (Normal Range: 0-1) Nitrite: negative   (Normal Range: Negative) Leukocyte Esterace: 1+   (Normal Range: Negative)    Comments: Rita Ohara  January 30, 2010 4:38 PM

## 2010-04-04 NOTE — Progress Notes (Signed)
Summary: CBC due   Phone Note Outgoing Call Call back at [facility] Phone [phone]   Call placed by: Merri Ray CMA [facility]),  Jul 07, 2009 9:14 AM Action Taken: Appt scheduled Summary of Call: Called pt to inform that labs are due. Orders in IDX Initial call taken by: Merri Ray CMA [facility]),  Jul 07, 2009 9:14 AM

## 2010-04-04 NOTE — Progress Notes (Signed)
Summary: pot refill  Phone Note Call from Patient Call back at [facility] Phone [phone]   Summary of Call: Need potassium pills.  Review labs Dr. Arlyce Dice.  Made appointment 6-24.  Please order potassium [facility] mailorder to last until appointment.   Initial call taken by: Rudy Jew, RN,  Aug 02, 2009 12:40 PM  Follow-up for Phone Call        Rx sent to [facility]. Pt has appt in June. Follow-up by: Romualdo Bolk, CMA (AAMA),  Aug 02, 2009 4:34 PM    New/Updated Medications: KLOR-CON M20 20 MEQ CR-TABS (POTASSIUM CHLORIDE CRYS CR) 1 by mouth once daily Prescriptions: KLOR-CON M20 20 MEQ CR-TABS (POTASSIUM CHLORIDE CRYS CR) 1 by mouth once daily  #90 x 0   Entered by:   Romualdo Bolk, CMA (AAMA)   Authorized by:   Madelin Headings MD   Signed by:   Romualdo Bolk, CMA (AAMA) on 08/02/2009   Method used:   Electronically to        [facility]* (mail-order)             ,          Ph: [phone]       Fax: [phone]   RxID:   [id number]

## 2010-04-04 NOTE — Assessment & Plan Note (Signed)
Summary: flu shot/njr   Nurse Visit   Allergies: No Known Drug Allergies  Orders Added: 1)  Flu Vaccine 26yrs + MEDICARE PATIENTS [Q2039] 2)  Administration Flu vaccine - MCR [G0008]    Review of Systems       Flu Vaccine Consent Questions     Do you have a history of severe allergic reactions to this vaccine? no    Any prior history of allergic reactions to egg and/or gelatin? no    Do you have a sensitivity to the preservative Thimersol? no    Do you have a past history of Guillan-Barre Syndrome? no    Do you currently have an acute febrile illness? no    Have you ever had a severe reaction to latex? no    Vaccine information given and explained to patient? yes    Are you currently pregnant? no    Lot Number:[id number]   Exp Date:09/02/2010   Site Given  Left Deltoid IM Josph Macho RMA  November 21, 2009 2:55 PM

## 2010-04-04 NOTE — Progress Notes (Signed)
Summary: CAPSULE ENDOSCOPY SCHEDULED   Phone Note Outgoing Call   Call placed by: Laureen Ochs LPN,  November 11, 2008 11:01 AM Call placed to: Patient Summary of Call: Pt. is scheduled for a Capsule Endoscopy at [facility] on 11-15-08 at 8am. She will get her instructions today. Pt. instructed to call back as needed.  Initial call taken by: Laureen Ochs LPN,  November 11, 2008 11:06 AM

## 2010-04-04 NOTE — Letter (Signed)
Summary: EGD Instructions  [facility]  [address] [place], [place] [address]   Phone: [phone]  Fax: [phone]       [name]    1930/10/27    MRN: [id number]       Procedure Day /Date:WEDNESDAY 01/12/2009     Arrival Time: 11:30AM     Procedure Time:12:30PM     Location of Procedure:                     [facility] ( Outpatient Registration)  PREPARATION FOR ENDOSCOPY/ENTEROSCOPY AND ERBY   On 01/12/2009 THE DAY OF THE PROCEDURE:  1.   No solid foods, milk or milk products are allowed after midnight the night before your procedure.  2.   Do not drink anything colored red or purple.  Avoid juices with pulp.  No orange juice.  3.  You may drink clear liquids until 8:30AM, which is 4 hours before your procedure.                                                                                                CLEAR LIQUIDS INCLUDE: Water Jello Ice Popsicles Tea (sugar ok, no milk/cream) Powdered fruit flavored drinks Coffee (sugar ok, no milk/cream) Gatorade Juice: apple, white grape, white cranberry  Lemonade Clear bullion, consomm, broth Carbonated beverages (any kind) Strained chicken noodle soup Hard Candy   MEDICATION INSTRUCTIONS  Unless otherwise instructed, you should take regular prescription medications with a small sip of water as early as possible the morning of your procedure.             OTHER INSTRUCTIONS  You will need a responsible adult at least 75 years of age to accompany you and drive you home.   This person must remain in the waiting room during your procedure.  Wear loose fitting clothing that is easily removed.  Leave jewelry and other valuables at home.  However, you may wish to bring a book to read or an iPod/MP3 player to listen to music as you wait for your procedure to start.  Remove all body piercing jewelry and leave at home.  Total time from sign-in until discharge is approximately  2-3 hours.  You should go home directly after your procedure and rest.  You can resume normal activities the day after your procedure.  The day of your procedure you should not:   Drive   Make legal decisions   Operate machinery   Drink alcohol   Return to work  You will receive specific instructions about eating, activities and medications before you leave.    The above instructions have been reviewed and explained to me by   _______________________    I fully understand and can verbalize these instructions _____________________________ Date _________

## 2010-04-04 NOTE — Progress Notes (Signed)
Summary: U/S results  Phone Note Call from Patient   Caller: Patient Call For: Madelin Headings MD Summary of Call: Needs U/S results. [phone] Initial call taken by: Lynann Beaver CMA AAMA,  February 06, 2010 3:29 PM  Follow-up for Phone Call        Pt aware of results. Follow-up by: Romualdo Bolk, CMA (AAMA),  February 06, 2010 3:48 PM

## 2010-04-04 NOTE — Procedures (Signed)
Summary: Capsule Endo Report / [facility]  Capsule Endo Report / [facility]   Imported By: Lennie Odor 12/01/2008 15:15:19  _____________________________________________________________________  External Attachment:    Type:   Image     Comment:   External Document

## 2010-04-04 NOTE — Progress Notes (Signed)
Summary: Daughter's phone number.   Phone Note Call from Patient   Call For: [name] CMA Summary of Call: Pt wants [place] to talk to her daughter, Bertram Gala, lives here in [place].  The daughter's number is [phone]. I asked the pt to sinig a Hippa form when she left today. Initial call taken by: Joselyn Glassman,  November 15, 2008 9:22 AM     Appended Document: Daughter's phone number. Pt going out of town on 9-21 for 1 month. We won't be able to reach her, that is why we are to call her daughter.

## 2010-04-04 NOTE — Assessment & Plan Note (Signed)
Summary: F/U FROM ENDO. 01-12-09 AND RECENT LABS      DEBORAH    History of Present Illness Visit Type: Follow-up Visit Primary GI MD: Melvia Heaps MD [facility] Primary Provider: Berniece Andreas MD Requesting Provider: n/a Chief Complaint: F/ Endo & labs History of Present Illness:   [name] has returned for followup of her iron deficiency anemia.  In November, 2010 she underwent enteroscopy with laser obliteration of several small small bowel AVMs.  She has no GI complaints at this time including melena, change of bowel habits or weakness.  A hemoglobin drawn today was 9.8.  the patient has noted a slight decrease in appetite over the last 6 months.  She is lost about 10 pounds.  She is otherwise feeling well.   GI Review of Systems      Denies abdominal pain, acid reflux, belching, bloating, chest pain, dysphagia with liquids, dysphagia with solids, heartburn, loss of appetite, nausea, vomiting, vomiting blood, weight loss, and  weight gain.        Denies anal fissure, black tarry stools, change in bowel habit, constipation, diarrhea, diverticulosis, fecal incontinence, heme positive stool, hemorrhoids, irritable bowel syndrome, jaundice, light color stool, liver problems, rectal bleeding, and  rectal pain.    Current Medications (verified): 1)  Symbicort 160-4.5 Mcg/act Aero (Budesonide-Formoterol Fumarate) 2)  Fexofenadine Hcl 180 Mg Tabs (Fexofenadine Hcl) .Marland Kitchen.. 1 By Mouth Once Daily 3)  Lisinopril-Hydrochlorothiazide 20-12.5 Mg Tabs (Lisinopril-Hydrochlorothiazide) .Marland Kitchen.. 1 By Mouth Once Daily 4)  Norvasc 5 Mg Tabs (Amlodipine Besylate) .... Take 1 Tablet By Mouth Once A Day 5)  Omeprazole 20 Mg Cpdr (Omeprazole) .Marland Kitchen.. 1 By Mouth Once Daily 6)  Patanol 0.1 % Soln (Olopatadine Hcl) .... As Needed 7)  Simvastatin 20 Mg Tabs (Simvastatin) .... Take 1 Tablet By Mouth Once A Day 8)  Singulair 10 Mg Tabs (Montelukast Sodium) .... As Needed 9)  Vesicare 5 Mg  Tabs (Solifenacin Succinate)  .Marland Kitchen.. 1 By Mouth Once Daily 10)  Vitamin D [address] Unit Caps (Ergocalciferol) .Marland Kitchen.. 1 By Mouth Q Week  Allergies (verified): No Known Drug Allergies  Past History:  Past Medical History: Reviewed history from 04/13/2008 and no changes required. Allergic rhinitis Asthma GERD Hyperlipidemia Hypertension   stress test 2009 saw Dr Daleen Squibb. Osteoarthritis        LAST Mammogram: 12/25/2007 Pap: 2004 Td: 2003 Colonscopy: 2008 EKG: 2008 Dexa: 2008 Eye Exam: 04/2007 Other: pneumovax 2005 Smoking: Former   Consults: Dr. Arlyce Dice Dr. Daleen Squibb Dr. [place] Callas  Past Surgical History: Rt hip replacement 1/06 Laryngectomy  Family History: Reviewed history from 05/24/2008 and no changes required. Father: unsure Mother:  breast cancer, ovarian cancer  ? anemia  Siblings: Only child    anemia ? runs in the family  Social History: Reviewed history from 07/26/2008 and no changes required. 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    Review of Systems       The patient complains of allergy/sinus, anemia, arthritis/joint pain, back pain, fatigue, shortness of breath, swelling of feet/legs, thirst - excessive, and voice change.    Vital Signs:  Patient profile:   77 year old female Menstrual status:  postmenopausal Height:      61 inches Weight:      170.50 pounds BMI:     32.33 Pulse rate:   76 / minute Pulse rhythm:   regular BP sitting:   146 / 80  (right arm) Cuff size:   regular  Vitals  Entered By: June McMurray CMA Duncan Dull) (April 07, 2009 8:47 AM)   Impression & Recommendations:  Problem # 1:  ANGIODYSPLASIA OF INTESTINE WITH HEMORRHAGE (ICD-569.85) This is her source for chronic blood loss.  She is  status post laser obliteration of AVMs.  Hemoglobin has slightly decreased.  Recommendations #1 begin iron supplementation Feosol 2 mg daily #2 repeat CBC in 3 months  Problem # 2:  ANEMIA, IRON DEFICIENCY (ICD-280.9) See  assessment #1. Prescriptions: FEOSOL 200 (65 FE) MG TABS (FERROUS SULFATE DRIED) take one tab daily  #30 x 2   Entered and Authorized by:   Louis Meckel MD   Signed by:   Louis Meckel MD on 04/07/2009   Method used:   Historical   RxID:   [id number]   Appended Document: F/U FROM ENDO. 01-12-09 AND RECENT LABS      [facility]    Clinical Lists Changes  Medications: Rx of FEOSOL 200 (65 FE) MG TABS (FERROUS SULFATE DRIED) take one tab daily;  #30 x 2;  Signed;  Entered by: Merri Ray CMA (AAMA);  Authorized by: Louis Meckel MD;  Method used: Electronically to [facility]. [phone]*, [address] [place], [place], [place]  [address], Ph: [phone], Fax: [phone]    Prescriptions: FEOSOL 200 (65 FE) MG TABS (FERROUS SULFATE DRIED) take one tab daily  #30 x 2   Entered by:   Merri Ray CMA (AAMA)   Authorized by:   Louis Meckel MD   Signed by:   Merri Ray CMA (AAMA) on 04/07/2009   Method used:   Electronically to        [facility]. [phone]* (retail)       [address]       [place], [place]  [address]       Ph: [phone]       Fax: [phone]   RxID:   [id number]

## 2010-04-04 NOTE — Progress Notes (Signed)
Summary: CHEST PAIN,SCHEDULED OV  Phone Note Call from Patient Call back at Home Phone [phone]   Caller: Patient Call For: Dr Fabian Sharp Reason for Call: Talk to Nurse Summary of Call: pt need nurse to call back  Initial call taken by: Shan Levans,  December 05, 2006 3:58 PM  Follow-up for Phone Call        cALLED PT BACK, SHE IS EXPERIENCING A SHOOTING PAIN INTO CHEST AREA WHICH LAST FOR A SECOND OR TWO ONLY, NO SOB, NO RADIATING PAIN TO NECK OF LEFT ARM, NO DIAPHORESIS OF NAUSEA.  SCHEDULED OV TOMORROW WITH DR [facility] Follow-up by: Sid Falcon LPN,  December 05, 2006 4:30 PM

## 2010-04-04 NOTE — Assessment & Plan Note (Signed)
Summary: 1 MONTH ROV/NJR   Vital Signs:  Patient profile:   77 year old female Menstrual status:  postmenopausal Height:      61 inches Weight:      183 pounds BMI:     34.70 Pulse rate:   66 / minute BP sitting:   120 / 80  (left arm) Cuff size:   regular  Vitals Entered By: Romualdo Bolk, CMA (May 24, 2008 8:30 AM) CC: Follow up on blood pressure LMP - Character: age 27 Menarche (age onset): 13 years   days  Menstrual Status postmenopausal Last PAP Result Normal   History of Present Illness: [name] comes in for follow up of anemia and SOB.   she has started on iron as directed .   NOw on weekly Vit D   also. No bleeding  Gi . NO other unusual brusing or bleeding  Some GERD and ocass lapse inomeprezole .  Has stopped  ASA as directed . Had echo test no change in fatigue or dyspnea and no acute changes .   Hypertension History:      She complains of dyspnea with exertion and peripheral edema, but denies headache, chest pain, palpitations, orthopnea, PND, visual symptoms, neurologic problems, and syncope.  She notes no problems with any antihypertensive medication side effects.  Further comments include: SOB with exertion and swelling of ankles.        Positive major cardiovascular risk factors include female age 8 years old or older, hyperlipidemia, and hypertension.  Negative major cardiovascular risk factors include non-tobacco-user status.     Preventive Screening-Counseling & Management     Alcohol drinks/day: 0     Smoking Status: quit     Year Quit: 30 years ago     Caffeine use/day: 2     Does Patient Exercise: no  Current Medications (verified): 1)  Advair Diskus 500-50 Mcg/dose Misc (Fluticasone-Salmeterol) 2)  Fexofenadine Hcl 180 Mg Tabs (Fexofenadine Hcl) .Marland Kitchen.. 1 By Mouth Once Daily 3)  Klor-Con M20 20 Meq Tbcr (Potassium Chloride Crys Cr) .Marland Kitchen.. 1 By Mouth Once Daily 4)  Lisinopril-Hydrochlorothiazide 20-12.5 Mg Tabs  (Lisinopril-Hydrochlorothiazide) .Marland Kitchen.. 1 By Mouth Once Daily 5)  Norvasc 5 Mg Tabs (Amlodipine Besylate) .... Take 1 Tablet By Mouth Once A Day 6)  Omeprazole 20 Mg Cpdr (Omeprazole) .Marland Kitchen.. 1 By Mouth Once Daily 7)  Patanol 0.1 % Soln (Olopatadine Hcl) 8)  Simvastatin 20 Mg Tabs (Simvastatin) .... Take 1 Tablet By Mouth Once A Day 9)  Singulair 10 Mg Tabs (Montelukast Sodium) 10)  Vesicare 5 Mg  Tabs (Solifenacin Succinate) .Marland Kitchen.. 1 By Mouth Once Daily 11)  Vitamin D 78469 Unit Caps (Ergocalciferol) .Marland Kitchen.. 1 By Mouth Q Week  Allergies (verified): No Known Drug Allergies  Past History:  Past Medical History:    Allergic rhinitis    Asthma    GERD    Hyperlipidemia    Hypertension   stress test 2009 saw Dr Daleen Squibb.    Osteoarthritis          LAST    Mammogram: 12/25/2007    Pap: 2004    Td: 2003    Colonscopy: 2008    EKG: 2008    Dexa: 2008    Eye Exam: 04/2007    Other: pneumovax 2005    Smoking: Former         Consults:    Dr. Arlyce Dice    Dr. Daleen Squibb    Dr. [place] Callas     (04/13/2008)  Past Surgical  History:    Rt hip replacement 1/06 (03/26/2008)  Family History:    Father: unsure    Mother:  breast cancer, ovarian cancer  ? anemia     Siblings: Only child      anemia ? 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      (04/13/2008)  Past History:  Care Management:    Allergy: Dr. [place] Callas    Cardiology: Dr. Daleen Squibb    Gastroenterology: Dr. Arlyce Dice  Family History:    Father: unsure    Mother:  breast cancer, ovarian cancer  ? anemia     Siblings: Only child          anemia ? runs in the family  Social History:    Caffeine use/day:  2    Does Patient Exercise:  no  Review of Systems  The patient denies anorexia, fever, weight loss, syncope, prolonged cough, hemoptysis, melena, hematochezia, severe indigestion/heartburn, hematuria, muscle weakness, transient blindness,  depression, unusual weight change, abnormal bleeding, enlarged lymph nodes, and angioedema.         REST OF ROS NEG OR NO CHANGE   Physical Exam  General:  Well-developed,well-nourished,in no acute distress; alert,appropriate and cooperative throughout examination Head:  normocephalic and atraumatic.   Neck:  No deformities, masses, or tenderness noted. Lungs:  Normal respiratory effort, chest expands symmetrically. Lungs are clear to auscultation, no crackles or wheezes.no dullness.   Heart:  normal rate, regular rhythm, no gallop, no rub, and no JVD.   Abdomen:  soft, non-tender, normal bowel sounds, no hepatomegaly, and no splenomegaly.  no bruits  Pulses:  pulses intact without delay   Extremities:  1+ left pedal edema and 1+ right pedal edema.   no cc  Neurologic:  alert & oriented X3.   Skin:  turgor normal, color normal, no ecchymoses, no petechiae, no purpura, and no ulcerations.   Cervical Nodes:  No lymphadenopathy noted Psych:  Normal eye contact, appropriate affect. Cognition appears normal.  Additional Exam:  see labs    Impression & Recommendations:  Problem # 1:  UNSPECIFIED ANEMIA (ICD-285.9) cw iron deficiency without hx of bleeding  improved capillary today up from 10. raNGE  Orders: Hgb ([address])  Hgb: 11.7 (05/24/2008)   Hct: 32.3 (04/13/2008)   Platelets: 351 (04/13/2008) RBC: 4.39 (04/13/2008)   RDW: 18.0 (04/13/2008)   WBC: 8.6 (04/13/2008) MCV: 73.6 (04/13/2008)   MCHC: 31.9 (04/13/2008) Ferritin: 7.7 (04/13/2008) B12: 379 (04/13/2008)   Folate: 13.6 (04/13/2008)   TSH: 0.57 (03/26/2008)  Problem # 2:  VITAMIN D DEFICIENCY (ICD-268.9) on weekly replacement   Problem # 3:  DYSPNEA ON EXERTION (ICD-786.09) echo normal  reviewed with patient  Her updated medication list for this problem includes:    Advair Diskus 500-50 Mcg/dose Misc (Fluticasone-salmeterol)    Lisinopril-hydrochlorothiazide 20-12.5 Mg Tabs (Lisinopril-hydrochlorothiazide) .Marland Kitchen... 1 by mouth  once daily    Singulair 10 Mg Tabs (Montelukast sodium)  Complete Medication List: 1)  Advair Diskus 500-50 Mcg/dose Misc (Fluticasone-salmeterol) 2)  Fexofenadine Hcl 180 Mg Tabs (Fexofenadine hcl) .Marland Kitchen.. 1 by mouth once daily 3)  Klor-con M20 20 Meq Tbcr (Potassium chloride crys cr) .Marland Kitchen.. 1 by mouth once daily 4)  Lisinopril-hydrochlorothiazide 20-12.5 Mg Tabs (Lisinopril-hydrochlorothiazide) .Marland Kitchen.. 1 by mouth once daily 5)  Norvasc 5 Mg Tabs (Amlodipine besylate) .... Take 1 tablet by mouth  once a day 6)  Omeprazole 20 Mg Cpdr (Omeprazole) .Marland Kitchen.. 1 by mouth once daily 7)  Patanol 0.1 % Soln (Olopatadine hcl) 8)  Simvastatin 20 Mg Tabs (Simvastatin) .... Take 1 tablet by mouth once a day 9)  Singulair 10 Mg Tabs (Montelukast sodium) 10)  Vesicare 5 Mg Tabs (Solifenacin succinate) .Marland Kitchen.. 1 by mouth once daily 11)  Vitamin D [address] Unit Caps (Ergocalciferol) .Marland Kitchen.. 1 by mouth q week  Hypertension Assessment/Plan:      The patient's hypertensive risk group is category B: At least one risk factor (excluding diabetes) with no target organ damage.  Her calculated 10 year risk of coronary heart disease is 7 %.  Today's blood pressure is 120/80.    Patient Instructions: 1)  stay on the iron supplement  2)  No Aspirirn 3)  take tylenol for pain first . 4)  Minimize   advil aleve type medications. 5)  return office visit in 2 months or as needed. 6)  i willl review to decide on poss Gi reeval. 7)  The medication list was reviewed and reconciled.  All changed / newly prescribed medications were explained.  A complete medication list was provided to the patient / caregiver.      Laboratory Results   CBC   HGB:  11.7 g/dL   (Normal Range: 60.4-54.0 in Males, 12.0-15.0 in Females) Comments: Rita Ohara  May 24, 2008 9:14 AM

## 2010-04-04 NOTE — Progress Notes (Signed)
Summary: refill  Phone Note Refill Request Message from:  Fax from Pharmacy  Refills Requested: Medication #1:  VITAMIN D [address] UNIT CAPS 1 by mouth q week [facility] mail order pharmacy fax---[phone]  Initial call taken by: Warnell Forester,  March 11, 2009 1:13 PM  Follow-up for Phone Call        Rx sent electronically. Follow-up by: Romualdo Bolk, CMA (AAMA),  March 11, 2009 3:19 PM    Prescriptions: VITAMIN D 14782 UNIT CAPS (ERGOCALCIFEROL) 1 by mouth q week  #12 x 3   Entered by:   Romualdo Bolk, CMA (AAMA)   Authorized by:   Madelin Headings MD   Signed by:   Romualdo Bolk, CMA (AAMA) on 03/11/2009   Method used:   Electronically to        [facility]* (mail-order)             ,          Ph: [phone]       Fax: [phone]   RxID:   [id number]

## 2010-04-04 NOTE — Letter (Signed)
Summary: [facility]  [facility]   Imported By: Maryln Gottron 03/08/2009 14:48:44  _____________________________________________________________________  External Attachment:    Type:   Image     Comment:   External Document

## 2010-04-04 NOTE — Assessment & Plan Note (Signed)
Summary: 2 month rov/njr   Vital Signs:  Patient profile:   77 year old female Menstrual status:  postmenopausal Weight:      177 pounds Pulse rate:   66 / minute BP sitting:   130 / 80  (left arm) Cuff size:   regular  Vitals Entered By: Romualdo Bolk, CMA (Jul 26, 2008 8:46 AM) CC: Follow-up visit , Hypertension Management   History of Present Illness: [name] comes in today   for follow up  of anemia  iron deficincy as directed  .  Her last hg was better  in the 11 range   .   Had stool cards done negative.  Anemia  :   taking iron about 50% of the time when remembers  no bleeding . exrecising and no new symptoms . No hx of UGI bleed or sig symptoms  except gerd on prilosec.   HT no change in meds and doing ok.  See last check .  Hypertension History:      She complains of peripheral edema, but denies headache, chest pain, palpitations, dyspnea with exertion, orthopnea, PND, visual symptoms, neurologic problems, syncope, and side effects from treatment.  She notes no problems with any antihypertensive medication side effects.  Further comments include: Swelling of ankles.        Positive major cardiovascular risk factors include female age 7 years old or older, hyperlipidemia, and hypertension.  Negative major cardiovascular risk factors include non-tobacco-user status.     Preventive Screening-Counseling & Management     Alcohol drinks/day: 0     Smoking Status: quit     Year Quit: 30 years ago     Caffeine use/day: 2     Does Patient Exercise: no  Current Medications (verified): 1)  Symbicort 160-4.5 Mcg/act Aero (Budesonide-Formoterol Fumarate) 2)  Fexofenadine Hcl 180 Mg Tabs (Fexofenadine Hcl) .Marland Kitchen.. 1 By Mouth Once Daily 3)  Klor-Con M20 20 Meq Tbcr (Potassium Chloride Crys Cr) .Marland Kitchen.. 1 By Mouth Once Daily 4)  Lisinopril-Hydrochlorothiazide 20-12.5 Mg Tabs (Lisinopril-Hydrochlorothiazide) .Marland Kitchen.. 1 By Mouth Once Daily 5)  Norvasc 5 Mg Tabs (Amlodipine Besylate)  .... Take 1 Tablet By Mouth Once A Day 6)  Omeprazole 20 Mg Cpdr (Omeprazole) .Marland Kitchen.. 1 By Mouth Once Daily 7)  Patanol 0.1 % Soln (Olopatadine Hcl) 8)  Simvastatin 20 Mg Tabs (Simvastatin) .... Take 1 Tablet By Mouth Once A Day 9)  Singulair 10 Mg Tabs (Montelukast Sodium) 10)  Vesicare 5 Mg  Tabs (Solifenacin Succinate) .Marland Kitchen.. 1 By Mouth Once Daily 11)  Vitamin D 04540 Unit Caps (Ergocalciferol) .Marland Kitchen.. 1 By Mouth Q Week  Allergies (verified): No Known Drug Allergies  Past History:  Past medical, surgical, family and social histories (including risk factors) reviewed for relevance to current acute and chronic problems.  Past Medical History:    Reviewed history from 04/13/2008 and no changes required:    Allergic rhinitis    Asthma    GERD    Hyperlipidemia    Hypertension   stress test 2009 saw Dr Daleen Squibb.    Osteoarthritis              LAST    Mammogram: 12/25/2007    Pap: 2004    Td: 2003    Colonscopy: 2008    EKG: 2008    Dexa: 2008    Eye Exam: 04/2007    Other: pneumovax 2005    Smoking: Former         Consults:  Dr. Arlyce Dice    Dr. Daleen Squibb    Dr. [place] Callas  Past Surgical History:    Reviewed history from 03/26/2008 and no changes required:    Rt hip replacement 1/06  Family History:    Reviewed history from 05/24/2008 and no changes required:       Father: unsure       Mother:  breast cancer, ovarian cancer  ? anemia        Siblings: Only child                anemia ? runs in the family  Social History:    Reviewed history from 04/13/2008 and no changes required:       widowed 2001       Former Smoker       HH of1        active in church and volunteer activities.       Still bowls.          Review of Systems  The patient denies anorexia, fever, chest pain, prolonged cough, hemoptysis, abdominal pain, melena, hematochezia, severe indigestion/heartburn, depression, abnormal bleeding, enlarged lymph nodes, and angioedema.         gait has changes since  hip  surgery   harder to go fast .  no real pain or balance problems   Physical Exam  General:  Well-developed,well-nourished,in no acute distress; alert,appropriate and cooperative throughout examination Head:  normocephalic and atraumatic.   Eyes:  vision grossly intact, pupils equal, and pupils round.   Neck:  No deformities, masses, or tenderness noted. Lungs:  normal respiratory effort and no intercostal retractions.   Heart:  Normal rate and regular rhythm. S1 and S2 normal without gallop, , click, rub or other extra sounds. Abdomen:  Bowel sounds positive,abdomen soft and non-tender without masses, organomegaly or noted. Msk:  oa changes on hand s Pulses:  pulses intact without delay   Extremities:  1+ left pedal edema and 1+ right pedal edema.   Neurologic:  gait grossly nl Skin:  turgor normal, color normal, no ecchymoses, and no petechiae.   Cervical Nodes:  No lymphadenopathy noted Psych:  Normal eye contact, appropriate affect. Cognition appears normal.    Impression & Recommendations:  Problem # 1:  ANEMIA, IRON DEFICIENCY (ICD-280.9) disc  as to isgnificance . Pt feels fine and thinks its proabaly a family hx problem.   Stool cards were neg at last check and colon was neg in 2008 except for tics  noevidnece of  gastritis or systemic issues.   has been on PPi for a while however.  Orders: TLB-CBC Platelet - w/Differential (85025-CBCD) TLB-Ferritin (82728-FER) TLB-Sedimentation Rate (ESR) (85652-ESR)  Problem # 2:  HYPERTENSION (ICD-401.9) stable Her updated medication list for this problem includes:    Lisinopril-hydrochlorothiazide 20-12.5 Mg Tabs (Lisinopril-hydrochlorothiazide) .Marland Kitchen... 1 by mouth once daily    Norvasc 5 Mg Tabs (Amlodipine besylate) .Marland Kitchen... Take 1 tablet by mouth once a day  Problem # 3:  OSTEOARTHRITIS (ICD-715.90) stable    disc option to do pt for gait   but she wishes to  add swimming back to regimen for now and call if needed   Problem # 4:  VITAMIN  D DEFICIENCY (ICD-268.9)  Orders: T-Vitamin D (25-Hydroxy) (16109-60454)  Complete Medication List: 1)  Symbicort 160-4.5 Mcg/act Aero (Budesonide-formoterol fumarate) 2)  Fexofenadine Hcl 180 Mg Tabs (Fexofenadine hcl) .Marland Kitchen.. 1 by mouth once daily 3)  Klor-con M20 20 Meq Tbcr (Potassium chloride crys cr) .Marland Kitchen.. 1 by  mouth once daily 4)  Lisinopril-hydrochlorothiazide 20-12.5 Mg Tabs (Lisinopril-hydrochlorothiazide) .Marland Kitchen.. 1 by mouth once daily 5)  Norvasc 5 Mg Tabs (Amlodipine besylate) .... Take 1 tablet by mouth once a day 6)  Omeprazole 20 Mg Cpdr (Omeprazole) .Marland Kitchen.. 1 by mouth once daily 7)  Patanol 0.1 % Soln (Olopatadine hcl) 8)  Simvastatin 20 Mg Tabs (Simvastatin) .... Take 1 tablet by mouth once a day 9)  Singulair 10 Mg Tabs (Montelukast sodium) 10)  Vesicare 5 Mg Tabs (Solifenacin succinate) .Marland Kitchen.. 1 by mouth once daily 11)  Vitamin D 16109 Unit Caps (Ergocalciferol) .Marland Kitchen.. 1 by mouth q week  Hypertension Assessment/Plan:      The patient's hypertensive risk group is category B: At least one risk factor (excluding diabetes) with no target organ damage.  Her calculated 10 year risk of coronary heart disease is 7 %.  Today's blood pressure is 130/80.    Patient Instructions: 1)  continue iron supplement 2)  You will be informed of lab results when available.  3)  then plan follow up  4)  If continuing   consider GI consult

## 2010-04-04 NOTE — Assessment & Plan Note (Signed)
Summary: MEDS & LAB REVIEW/PS   Vital Signs:  Patient profile:   77 year old female Menstrual status:  postmenopausal Weight:      167 pounds Pulse rate:   72 / minute BP sitting:   124 / 80  (left arm) Cuff size:   regular  Vitals Entered By: Romualdo Bolk, CMA (AAMA) (August 26, 2009 10:53 AM) CC: Follow-up visit on meds and labs done by Dr. Arlyce Dice, Hypertension Management   History of Present Illness: [name] comes in today  for follow up of multiple medical problems  Since last visit she has had a Gi evaluation for newer onset Iron deficiiency anemia that was felt secondary to avm in duodenum rx with laser ablation. SInce then she has been on iron and is here for follow up  Also  follow up meds : BP  Good readings has lost weight. LIPIDs: no se of meds  Asthma : no flare but does have continued doe.   No change but slower walking.   ? when last pfts were done  at dr shrmas Leg edema has had this for years no change. GERD  Takes pepcid or prilosec   most days  Vit d still taking. last level ? when   Hypertension History:      She complains of peripheral edema, but denies headache, chest pain, palpitations, dyspnea with exertion, orthopnea, PND, visual symptoms, neurologic problems, syncope, and side effects from treatment.  She notes no problems with any antihypertensive medication side effects.  Ankles swelling.        Positive major cardiovascular risk factors include female age 47 years old or older, hyperlipidemia, and hypertension.  Negative major cardiovascular risk factors include non-tobacco-user status.     Preventive Screening-Counseling & Management  Alcohol-Tobacco     Alcohol drinks/day: 0     Smoking Status: quit     Year Quit: 30 years ago  Caffeine-Diet-Exercise     Caffeine use/day: 2     Does Patient Exercise: no  Current Medications (verified): 1)  Symbicort 160-4.5 Mcg/act Aero (Budesonide-Formoterol Fumarate) 2)  Fexofenadine Hcl 180 Mg  Tabs (Fexofenadine Hcl) .Marland Kitchen.. 1 By Mouth Once Daily 3)  Lisinopril-Hydrochlorothiazide 20-12.5 Mg Tabs (Lisinopril-Hydrochlorothiazide) .Marland Kitchen.. 1 By Mouth Once Daily 4)  Norvasc 5 Mg Tabs (Amlodipine Besylate) .... Take 1 Tablet By Mouth Once A Day 5)  Omeprazole 20 Mg Cpdr (Omeprazole) .Marland Kitchen.. 1 By Mouth Once Daily 6)  Patanol 0.1 % Soln (Olopatadine Hcl) .... As Needed 7)  Simvastatin 20 Mg Tabs (Simvastatin) .... Take 1 Tablet By Mouth Once A Day 8)  Singulair 10 Mg Tabs (Montelukast Sodium) .... As Needed 9)  Vesicare 5 Mg  Tabs (Solifenacin Succinate) .Marland Kitchen.. 1 By Mouth Once Daily 10)  Vitamin D 08657 Unit Caps (Ergocalciferol) .Marland Kitchen.. 1 By Mouth Q Week 11)  Feosol 200 (65 Fe) Mg Tabs (Ferrous Sulfate Dried) .... Take One Tab Daily 12)  Klor-Con M20 20 Meq Cr-Tabs (Potassium Chloride Crys Cr) .Marland Kitchen.. 1 By Mouth Once Daily  Allergies (verified): No Known Drug Allergies  Past History:  Past medical, surgical, family and social histories (including risk factors) reviewed, and no changes noted (except as noted below).  Past Medical History: Allergic rhinitis Asthma GERD Hyperlipidemia Hypertension   stress test 2009 saw Dr Daleen Squibb. Osteoarthritis Iron deficincy anemia from AVM duodenum  rx with  ablation. 2011 PFTS 2010 " nl" Echo 2010 nl lv  Stress adenosine  neg for ischemia  LAST Mammogram: 12/25/2007 Pap: 2004 Td: 2003 Colonscopy: 2008 EKG: 2008 Dexa: 2008 Eye Exam: 04/2007 Other: pneumovax 2005 Smoking: Former   Consults: Dr. Arlyce Dice Dr. Daleen Squibb Dr. [place] Callas  Past Surgical History: Rt hip replacement 1/06 Laryngectomy duodenal avm ablation  Past History:  Care Management: Allergy: Dr. [place] Callas Cardiology: Dr. Daleen Squibb Gastroenterology: Dr. Arlyce Dice  Family History: Reviewed history from 05/24/2008 and no changes required. Father: unsure Mother:  breast cancer, ovarian cancer  ? anemia  Siblings: Only child    anemia ? runs in the family  Social History: Reviewed history  from 07/26/2008 and no changes required. 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    Review of Systems       The patient complains of dyspnea on exertion and peripheral edema.  The patient denies anorexia, fever, weight gain, decreased hearing, chest pain, syncope, prolonged cough, abdominal pain, melena, hematochezia, hematuria, difficulty walking, abnormal bleeding, enlarged lymph nodes, and angioedema.         hard to swallow the potassium pills   and says not to cut inhalf. back pain     Physical Exam  General:  alert, well-developed, well-nourished, and well-hydrated.   Head:  normocephalic and atraumatic.   Eyes:  vision grossly intact, pupils equal, and pupils round.   Neck:  No deformities, masses, or tenderness noted. Lungs:  Normal respiratory effort, chest expands symmetrically. Lungs are clear to auscultation, no crackles or wheezes. Heart:  Normal rate and regular rhythm. S1 and S2 normal without gallop, , click, rub or other extra sounds. Abdomen:  Bowel sounds positive,abdomen soft and non-tender without masses, organomegaly or noted. Pulses:  pulses intact without delay   Extremities:  1+ left pedal edema and 1+ right pedal edema.   Neurologic:  alert & oriented X3 and gait normal.  slow non focal  Skin:  turgor normal, color normal, no ecchymoses, and no petechiae.   Cervical Nodes:  No lymphadenopathy noted Psych:  Oriented X3, normally interactive, good eye contact, not anxious appearing, and not depressed appearing.     Impression & Recommendations:  Problem # 1:  ANEMIA, IRON DEFICIENCY (ICD-280.9) Assessment Improved from avm  ablated    cbc better but inc rdw   will continue iron for a few more months and then check with ibc   .  if ok than can stop  Her updated medication list for this problem includes:    Feosol 200 (65 Fe) Mg Tabs (Ferrous sulfate dried) .Marland Kitchen... Take one tab daily  Problem # 2:   HYPERTENSION (ICD-401.9) Assessment: Unchanged  hasnt had labs in over a year     medical attention on her anemia  Her updated medication list for this problem includes:    Lisinopril-hydrochlorothiazide 20-12.5 Mg Tabs (Lisinopril-hydrochlorothiazide) .Marland Kitchen... 1 by mouth once daily    Norvasc 5 Mg Tabs (Amlodipine besylate) .Marland Kitchen... Take 1 tablet by mouth once a day  BP today: 124/80 Prior BP: 146/80 (04/07/2009)  Prior 10 Yr Risk Heart Disease: 7 % (05/24/2008)  Labs Reviewed: K+: 3.6 (03/26/2008) Creat: : 0.7 (03/26/2008)   Chol: 162 (03/26/2008)   HDL: 56.0 (03/26/2008)   LDL: 84 (03/26/2008)   TG: 109 (03/26/2008)  Problem # 3:  DYSPNEA ON EXERTION (ICD-786.09) on going   ? no change    has asthma   has had some eval in the past   pfts echo and stress test 2009 and 2010 Her updated medication list for this problem  includes:    Symbicort 160-4.5 Mcg/act Aero (Budesonide-formoterol fumarate)    Lisinopril-hydrochlorothiazide 20-12.5 Mg Tabs (Lisinopril-hydrochlorothiazide) .Marland Kitchen... 1 by mouth once daily    Singulair 10 Mg Tabs (Montelukast sodium) .Marland Kitchen... As needed  Problem # 4:  OSTEOARTHRITIS (ICD-715.90) ongoing  Problem # 5:  ASTHMA (ICD-493.90) Assessment: Unchanged  Her updated medication list for this problem includes:    Symbicort 160-4.5 Mcg/act Aero (Budesonide-formoterol fumarate)    Singulair 10 Mg Tabs (Montelukast sodium) .Marland Kitchen... As needed  Problem # 6:  GERD (ICD-530.81) Assessment: Unchanged  Her updated medication list for this problem includes:    Omeprazole 20 Mg Cpdr (Omeprazole) .Marland Kitchen... 1 by mouth once daily  Problem # 7:  VITAMIN D DEFICIENCY (ICD-268.9) needs check level  as she has been on high dose and level   not done recently.   no fu  because  busy getting gi eval.  Complete Medication List: 1)  Symbicort 160-4.5 Mcg/act Aero (Budesonide-formoterol fumarate) 2)  Fexofenadine Hcl 180 Mg Tabs (Fexofenadine hcl) .Marland Kitchen.. 1 by mouth once daily 3)   Lisinopril-hydrochlorothiazide 20-12.5 Mg Tabs (Lisinopril-hydrochlorothiazide) .Marland Kitchen.. 1 by mouth once daily 4)  Norvasc 5 Mg Tabs (Amlodipine besylate) .... Take 1 tablet by mouth once a day 5)  Omeprazole 20 Mg Cpdr (Omeprazole) .Marland Kitchen.. 1 by mouth once daily 6)  Patanol 0.1 % Soln (Olopatadine hcl) .... As needed 7)  Simvastatin 20 Mg Tabs (Simvastatin) .... Take 1 tablet by mouth once a day 8)  Singulair 10 Mg Tabs (Montelukast sodium) .... As needed 9)  Vesicare 5 Mg Tabs (Solifenacin succinate) .Marland Kitchen.. 1 by mouth once daily 10)  Vitamin D 62952 Unit Caps (Ergocalciferol) .Marland Kitchen.. 1 by mouth q week 11)  Feosol 200 (65 Fe) Mg Tabs (Ferrous sulfate dried) .... Take one tab daily 12)  Klor-con M20 20 Meq Cr-tabs (Potassium chloride crys cr) .Marland Kitchen.. 1 by mouth once daily  Hypertension Assessment/Plan:      The patient's hypertensive risk group is category B: At least one risk factor (excluding diabetes) with no target organ damage.  Her calculated 10 year risk of coronary heart disease is 7 %.  Today's blood pressure is 124/80.  Her blood pressure goal is < 140/90.  Patient Instructions: 1)  continue iron at least 3 x per week.   to replenish your iron stores but your anemia is better.  2)  Ask the pharmacisti about   potassium preparation. 3)  You are due for full set of labs . 4)  In 2 months  5)  Fasting lipids liver , bmp, mg ,  tsh CBC diff, IBC , Vit d  ( vit d deficiency HT . Elevated lipids , , Anemia.)  6)  then ROV

## 2010-04-04 NOTE — Assessment & Plan Note (Signed)
Summary: flu shot/njr   Nurse Visit   Review of Systems       Flu Vaccine Consent Questions     Do you have a history of severe allergic reactions to this vaccine? no    Any prior history of allergic reactions to egg and/or gelatin? no    Do you have a sensitivity to the preservative Thimersol? no    Do you have a past history of Guillan-Barre Syndrome? no    Do you currently have an acute febrile illness? no    Have you ever had a severe reaction to latex? no    Vaccine information given and explained to patient? yes    Are you currently pregnant? no    Lot Number:[id number]   Exp Date:09/01/2009   Site Given  Left Deltoid IM    Allergies: No Known Drug Allergies  Orders Added: 1)  Admin 1st Vaccine [90471] 2)  Flu Vaccine 69yrs + [16109]

## 2010-04-04 NOTE — Letter (Signed)
Summary: [facility]  [facility]   Imported By: Maryln Gottron 08/24/2009 15:13:20  _____________________________________________________________________  External Attachment:    Type:   Image     Comment:   External Document

## 2010-04-04 NOTE — Miscellaneous (Signed)
Summary: Capsule endo form/[facility]  Capsule endo form/[facility]   Imported By: [place] 12/07/2008 09:52:26  _____________________________________________________________________  External Attachment:    Type:   Image     Comment:   External Document

## 2010-04-04 NOTE — Progress Notes (Signed)
Summary: ENDOSCOPY SCHEDULED   Phone Note Call from Patient Call back at Home Phone [phone]   Caller: Patient Call For: Dr. Arlyce Dice Reason for Call: Talk to Nurse Details for Reason: EGD? Summary of Call: pt says she was instructed by Dr. Arlyce Dice to come in regularly for EGD's... however there is not a reminder in IDX for this... pt says she is not having any problems... is there some confusion here? Initial call taken by: Vallarie Mare,  September 24, 2008 9:51 AM  Follow-up for Phone Call        Per OV note from 08-31-08, pt. to be scheduled for an Endoscopy. Pt. scheduled previsit for 11-03-08 at 11am and Endoscopy at [facility] on 11-10-08 at 10:30am. Pt. instructed to call back as needed.  Follow-up by: Laureen Ochs LPN,  September 24, 2008 10:07 AM

## 2010-04-04 NOTE — Letter (Signed)
Summary: [facility]   [facility]   Imported By: Maryln Gottron 01/22/2008 13:49:23  _____________________________________________________________________  External Attachment:    Type:   Image     Comment:   External Document

## 2010-04-04 NOTE — Procedures (Signed)
Summary: Colonoscopy   Colonoscopy  Procedure date:  07/30/2006  Findings:      Results: Diverticulosis.       Location:  [facility].    Comments:      Repeat colonoscopy in 10 years.    Colonoscopy  Procedure date:  07/30/2006  Findings:      Results: Diverticulosis.       Location:  [facility].    Comments:      Repeat colonoscopy in 10 years.   Patient Name: [name], [name] MRN:  Procedure Procedures: Colonoscopy CPT: [phone].  Personnel: Endoscopist: Barbette Hair. Arlyce Dice, MD.  Referred By: Berniece Andreas, MD.  Patient Consent: Procedure, Alternatives, Risks and Benefits discussed, consent obtained, from patient.  Indications  Average Risk Screening Routine.  History  Current Medications: Patient is not currently taking Coumadin.  Pre-Exam Physical: Performed Jul 30, 2006. Cardio-pulmonary exam, HEENT exam , Abdominal exam, Mental status exam WNL.  Comments: Patient history reviewed/updated, physical performed prior to initiation of sedation?YES Exam Exam: Extent of exam reached: Cecum, extent intended: Cecum.  Time to Cecum: 00:05:59. Time for Withdrawl: 00:06:46. Colon retroflexion performed. ASA Classification: I. Tolerance: good.  Monitoring: Pulse and BP monitoring, Oximetry used. Supplemental O2 given. at 2 Liters.  Colon Prep Used Miralax for colon prep. Prep results: fair, adequate exam.  Sedation Meds: Patient assessed and found to be appropriate for moderate (conscious) sedation. Sedation was managed by the Endoscopist. Fentanyl 50 mcg. given IV. Versed 7 mg. given IV.  Findings - DIVERTICULOSIS: Ascending Colon to Sigmoid Colon. ICD9: Diverticulosis: 562.10. Comments: Diffuse diverticulosis.  NORMAL EXAM: Cecum.  - NORMAL EXAM: Sigmoid Colon to Rectum.   Assessment Abnormal examination, see findings above.  Diagnoses: 562.10: Diverticulosis.   Events  Unplanned Interventions: No intervention was  required.  Unplanned Events: There were no complications. Plans  Post Exam Instructions: Post sedation instructions given.  Patient Education: Patient given standard instructions for: Diverticulosis.  Disposition: After procedure patient sent to recovery. After recovery patient sent home.  Scheduling/Referral: Colonoscopy, to Barbette Hair. Arlyce Dice, MD, around Jul 29, 2016.    This report was created from the original endoscopy report, which was reviewed and signed by the above listed endoscopist.    cc. Burna Mortimer Panosh,MD

## 2010-04-04 NOTE — Assessment & Plan Note (Signed)
Summary: anemia,..em    History of Present Illness Visit Type: Initial Consult Primary GI MD: Melvia Heaps MD [facility] Primary Provider: Berniece Andreas MD Requesting Provider: Berniece Andreas MD Chief Complaint: anemia [name] is a pleasant 77 year old Afro-American female referred at the request of Dr. Fabian Sharp for evaluation of anemia.  This was noted on routine testing.  In May, 2010 hemoglobin was 11.6.  Ferritin was 7.4.  in April, 2008 her hemoglobin was similar and her MCV was low.  The patient has no GI complaints including change of bowel habits, abdominal pain, melena or hematochezia.  She takes occasional ibuprofen.  Routine colonoscopy for colorectal cancer screening in May, 2008 was pertinent for diffuse diverticulosis.  She denies melena or hematochezia.   GI Review of Systems    Reports acid reflux and  heartburn.      Denies abdominal pain, belching, bloating, chest pain, dysphagia with liquids, dysphagia with solids, loss of appetite, nausea, vomiting, vomiting blood, weight loss, and  weight gain.        Denies anal fissure, black tarry stools, change in bowel habit, constipation, diarrhea, diverticulosis, fecal incontinence, heme positive stool, hemorrhoids, irritable bowel syndrome, jaundice, light color stool, liver problems, rectal bleeding, and  rectal pain.    Current Medications (verified): 1)  Symbicort 160-4.5 Mcg/act Aero (Budesonide-Formoterol Fumarate) 2)  Fexofenadine Hcl 180 Mg Tabs (Fexofenadine Hcl) .Marland Kitchen.. 1 By Mouth Once Daily 3)  Klor-Con M20 20 Meq Tbcr (Potassium Chloride Crys Cr) .Marland Kitchen.. 1 By Mouth Once Daily 4)  Lisinopril-Hydrochlorothiazide 20-12.5 Mg Tabs (Lisinopril-Hydrochlorothiazide) .Marland Kitchen.. 1 By Mouth Once Daily 5)  Norvasc 5 Mg Tabs (Amlodipine Besylate) .... Take 1 Tablet By Mouth Once A Day 6)  Omeprazole 20 Mg Cpdr (Omeprazole) .Marland Kitchen.. 1 By Mouth Once Daily 7)  Patanol 0.1 % Soln (Olopatadine Hcl) 8)  Simvastatin 20 Mg Tabs (Simvastatin) .... Take 1  Tablet By Mouth Once A Day 9)  Singulair 10 Mg Tabs (Montelukast Sodium) 10)  Vesicare 5 Mg  Tabs (Solifenacin Succinate) .Marland Kitchen.. 1 By Mouth Once Daily 11)  Vitamin D 74259 Unit Caps (Ergocalciferol) .Marland Kitchen.. 1 By Mouth Q Week  Allergies (verified): No Known Drug Allergies  Past History:  Past Medical History: Reviewed history from 04/13/2008 and no changes required. Allergic rhinitis Asthma GERD Hyperlipidemia Hypertension   stress test 2009 saw Dr Daleen Squibb. Osteoarthritis        LAST Mammogram: 12/25/2007 Pap: 2004 Td: 2003 Colonscopy: 2008 EKG: 2008 Dexa: 2008 Eye Exam: 04/2007 Other: pneumovax 2005 Smoking: Former   Consults: Dr. Arlyce Dice Dr. Daleen Squibb Dr. Dola Callas  Past Surgical History: Reviewed history from 03/26/2008 and no changes required. Rt hip replacement 1/06  Family History: Reviewed history from 05/24/2008 and no changes required. Father: unsure Mother:  breast cancer, ovarian cancer  ? anemia  Siblings: Only child    anemia ? runs in the family  Social History: Reviewed history from 07/26/2008 and no changes required. 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    Review of Systems       The patient complains of anemia, shortness of breath, swelling of feet/legs, and urination - excessive.         All other systems were reviewed and were negative   Vital Signs:  Patient profile:   77 year old female Menstrual status:  postmenopausal Height:      61 inches Weight:      175.50 pounds BMI:     33.28 Pulse rate:  72 / minute Pulse rhythm:   regular BP sitting:   128 / 76  (left arm)  Vitals Entered By: Chales Abrahams CMA (August 31, 2008 10:43 AM)  Physical Exam  Additional Exam:  She is a healthy-appearing female  skin: anicter  HEENT: normocephalic; PEERLA; no nasal or orpharyngeal abnormalities neck: supple nodes: no cervical adenopathy chest: clear cor:  no murmurs, gallops or rubs abd:  bowel  sounds normoactive; no abdominal masses, tenderness, organomegaly rectal: no masses; stool heme positive ext: no cyanosis, clubbing, or edema skeletal: no gross skeletal abnormalities neuro: alert, oriented x 3; no focal abnormalities    Impression & Recommendations:  Problem # 1:  ANEMIA, IRON DEFICIENCY (ICD-280.9) She is iron deficient from chronic GI blood loss.  A colonic source is less likely in view of her relatively recent colonoscopy.  AVMs anywhere along her GI tract are possible his.  She could have ulcer or a neoplasm in the upper GI tract.  Recommendations #1 upper endoscopy. #2 consider capsule endoscopy if #1 is negative.  Problem # 2:  DIVERTICULOSIS OF COLON (ICD-562.10) Assessment: Comment Only  Patient Instructions: 1)  Conscious Sedation brochure given.  2)  Upper Endoscopy brochure given.  3)  You will need to call back to schedule your Upper Endoscopy  4)  When you call back you will be scheduled with a Pre-visit nurse to get all your instructions 5)  The medication list was reviewed and reconciled.  All changed / newly prescribed medications were explained.  A complete medication list was provided to the patient / caregiver.

## 2010-04-04 NOTE — Progress Notes (Signed)
Summary: f/u appt?   Phone Note Call from Patient Call back at Home Phone [phone]   Caller: Patient Call For: Dr. Arlyce Dice Reason for Call: Talk to Nurse Summary of Call: pt thinks she is supposed to come in for a f/u appt, but doesnt know when or specifically why Initial call taken by: Vallarie Mare,  May 16, 2009 4:59 PM  Follow-up for Phone Call        Message left for pt-Per OV note on 04-07-09, she needs repeat lab work around 07-03-09, the order is in [facility], pt. advised to have labs done at that time. OV as needed. Pt. instructed to call back as needed.  Follow-up by: Laureen Ochs LPN,  May 17, 2009 9:13 AM

## 2010-04-04 NOTE — Letter (Signed)
Summary: Dr. Wink Callas note  Dr. [place] Callas note   Imported By: Kassie Mends 12/18/2006 15:26:35  _____________________________________________________________________  External Attachment:    Type:   Image     Comment:   Dr. [place] Callas note

## 2010-04-04 NOTE — Procedures (Signed)
Summary: Instructions for procedure/[place] [facility]  Instructions for procedure/[facility]   Imported By: Sherian Rein 11/15/2008 08:21:21  _____________________________________________________________________  External Attachment:    Type:   Image     Comment:   External Document

## 2010-04-04 NOTE — Procedures (Signed)
Summary: Capsule Endoscopy   Capsule Endoscopy  Procedure date:  11/15/2008  Findings:      Performing Location: [facility]   Ordering Physician: Melvia Heaps , MD   Report created/read PN:TIRWER Arlyce Dice, MD  Reason for Referral: 77 y/o female with FE deficiency anemia, heme positive stool.  EGD shows large hiatal hernia , colonoscopy 2008 negative except diverticulosis.  Procedure Information and Findings: 1)complete study, good prep 2) One AVM about 40 minutes beyond first duodenal image 3) one possible AVM in cecum    This report was created from the original report, which was reviewed and signed by the above listed reading physician.

## 2010-04-05 ENCOUNTER — Encounter: Payer: Self-pay | Admitting: Internal Medicine

## 2010-04-05 LAB — HM DIABETES EYE EXAM: HM Diabetic Eye Exam: NORMAL

## 2010-04-06 NOTE — Progress Notes (Signed)
Summary: referral for MRI of Breast  Phone Note From Other Clinic   Caller: [facility] Summary of Call: Pt needs a MRI of the Breast done for Rt breast invasive mammory carcinoma. Dr. Clent Ridges okay'd the order. Order sent to [facility]. Initial call taken by: Romualdo Bolk, CMA (AAMA),  March 17, 2010 9:12 AM  New Problems: CARCINOMA, BREAST (ICD-174.9)   New Problems: CARCINOMA, BREAST (ICD-174.9)

## 2010-04-12 NOTE — Medication Information (Signed)
Summary: Approval Notification for MRI Breasts  Approval Notification for MRI Breasts   Imported By: Maryln Gottron 04/05/2010 12:29:26  _____________________________________________________________________  External Attachment:    Type:   Image     Comment:   External Document

## 2010-04-20 ENCOUNTER — Ambulatory Visit
Admission: RE | Admit: 2010-04-20 | Discharge: 2010-04-20 | Disposition: A | Discharge status: Home / Self Care | Payer: MEDICARE | Source: Ambulatory Visit | Attending: General Surgery | Admitting: General Surgery

## 2010-04-20 ENCOUNTER — Encounter (HOSPITAL_BASED_OUTPATIENT_CLINIC_OR_DEPARTMENT_OTHER)
Admission: RE | Admit: 2010-04-20 | Discharge: 2010-04-20 | Disposition: A | Discharge status: Home / Self Care | Payer: MEDICARE | Source: Ambulatory Visit | Attending: General Surgery | Admitting: General Surgery

## 2010-04-20 ENCOUNTER — Other Ambulatory Visit: Payer: Self-pay | Admitting: General Surgery

## 2010-04-20 DIAGNOSIS — C50919 Malignant neoplasm of unspecified site of unspecified female breast: Secondary | ICD-10-CM

## 2010-04-20 DIAGNOSIS — Z01811 Encounter for preprocedural respiratory examination: Secondary | ICD-10-CM

## 2010-04-20 DIAGNOSIS — Z01812 Encounter for preprocedural laboratory examination: Secondary | ICD-10-CM | POA: Insufficient documentation

## 2010-04-20 DIAGNOSIS — Z01810 Encounter for preprocedural cardiovascular examination: Secondary | ICD-10-CM | POA: Insufficient documentation

## 2010-04-20 LAB — COMPREHENSIVE METABOLIC PANEL
ALT: 16 U/L (ref 0–35)
AST: 23 U/L (ref 0–37)
Albumin: 3.4 g/dL — ABNORMAL LOW (ref 3.5–5.2)
Alkaline Phosphatase: 64 U/L (ref 39–117)
BUN: 9 mg/dL (ref 6–23)
CO2: 28 mEq/L (ref 19–32)
Calcium: 9 mg/dL (ref 8.4–10.5)
Chloride: 101 mEq/L (ref 96–112)
Creatinine, Ser: 0.72 mg/dL (ref 0.4–1.2)
GFR calc Af Amer: >60 mL/min (ref >60)
GFR calc non Af Amer: >60 mL/min (ref >60)
Glucose, Bld: 108 mg/dL — ABNORMAL HIGH (ref 70–99)
Potassium: 3.8 mEq/L (ref 3.5–5.1)
Sodium: 140 mEq/L (ref 135–145)
Total Bilirubin: 0.3 mg/dL (ref 0.3–1.2)
Total Protein: 6.6 g/dL (ref 6.0–8.3)

## 2010-04-20 LAB — CBC
HCT: 38.2 % (ref 36.0–46.0)
Hemoglobin: 12.4 g/dL (ref 12.0–15.0)
MCH: 27.6 pg (ref 26.0–34.0)
MCHC: 32.5 g/dL (ref 30.0–36.0)
MCV: 85.1 fL (ref 78.0–100.0)
Platelets: 281 10*3/uL (ref 150–400)
RBC: 4.49 MIL/uL (ref 3.87–5.11)
RDW: 15.1 % (ref 11.5–15.5)
WBC: 7.3 10*3/uL (ref 4.0–10.5)

## 2010-04-20 LAB — DIFFERENTIAL
Basophils Absolute: 0 10*3/uL (ref 0.0–0.1)
Basophils Relative: 0 % (ref 0–1)
Eosinophils Absolute: 0.2 10*3/uL (ref 0.0–0.7)
Eosinophils Relative: 3 % (ref 0–5)
Lymphocytes Relative: 23 % (ref 12–46)
Lymphs Abs: 1.6 10*3/uL (ref 0.7–4.0)
Monocytes Absolute: 0.5 10*3/uL (ref 0.1–1.0)
Monocytes Relative: 7 % (ref 3–12)
Neutro Abs: 4.8 10*3/uL (ref 1.7–7.7)
Neutrophils Relative %: 67 % (ref 43–77)

## 2010-04-20 LAB — CANCER ANTIGEN 27.29: CA 27.29: 32 U/mL (ref 0–39)

## 2010-04-25 ENCOUNTER — Other Ambulatory Visit: Payer: Self-pay | Admitting: General Surgery

## 2010-04-25 ENCOUNTER — Other Ambulatory Visit (HOSPITAL_COMMUNITY): Payer: Self-pay

## 2010-04-25 ENCOUNTER — Ambulatory Visit
Admission: RE | Admit: 2010-04-25 | Discharge: 2010-04-25 | Disposition: A | Discharge status: Home / Self Care | Payer: MEDICARE | Source: Ambulatory Visit | Attending: General Surgery | Admitting: General Surgery

## 2010-04-25 ENCOUNTER — Ambulatory Visit (HOSPITAL_COMMUNITY)
Admission: RE | Admit: 2010-04-25 | Discharge: 2010-04-25 | Disposition: A | Discharge status: Home / Self Care | Payer: Medicare Other | Source: Ambulatory Visit | Attending: General Surgery | Admitting: General Surgery

## 2010-04-25 ENCOUNTER — Ambulatory Visit (HOSPITAL_BASED_OUTPATIENT_CLINIC_OR_DEPARTMENT_OTHER)
Admission: RE | Admit: 2010-04-25 | Discharge: 2010-04-25 | Disposition: A | Discharge status: Home / Self Care | Payer: Medicare Other | Source: Ambulatory Visit | Attending: General Surgery | Admitting: General Surgery

## 2010-04-25 DIAGNOSIS — C50911 Malignant neoplasm of unspecified site of right female breast: Secondary | ICD-10-CM

## 2010-04-25 DIAGNOSIS — C50419 Malignant neoplasm of upper-outer quadrant of unspecified female breast: Secondary | ICD-10-CM | POA: Insufficient documentation

## 2010-04-25 DIAGNOSIS — C50919 Malignant neoplasm of unspecified site of unspecified female breast: Secondary | ICD-10-CM | POA: Insufficient documentation

## 2010-04-25 DIAGNOSIS — Z01810 Encounter for preprocedural cardiovascular examination: Secondary | ICD-10-CM | POA: Insufficient documentation

## 2010-04-25 DIAGNOSIS — I10 Essential (primary) hypertension: Secondary | ICD-10-CM | POA: Insufficient documentation

## 2010-04-25 DIAGNOSIS — Z01812 Encounter for preprocedural laboratory examination: Secondary | ICD-10-CM | POA: Insufficient documentation

## 2010-04-25 DIAGNOSIS — K219 Gastro-esophageal reflux disease without esophagitis: Secondary | ICD-10-CM | POA: Insufficient documentation

## 2010-04-25 DIAGNOSIS — J4489 Other specified chronic obstructive pulmonary disease: Secondary | ICD-10-CM | POA: Insufficient documentation

## 2010-04-25 DIAGNOSIS — J449 Chronic obstructive pulmonary disease, unspecified: Secondary | ICD-10-CM | POA: Insufficient documentation

## 2010-04-25 MED ORDER — TECHNETIUM TC 99M SULFUR COLLOID FILTERED
1.0000 | Freq: Once | INTRAVENOUS | Status: AC | PRN
  Administered 2010-04-25: 1 via INTRADERMAL

## 2010-04-26 HISTORY — PX: BREAST LUMPECTOMY: SHX2

## 2010-04-27 ENCOUNTER — Other Ambulatory Visit: Payer: Self-pay | Admitting: Internal Medicine

## 2010-04-27 NOTE — Op Note (Signed)
NAMEGWENDOLIN, [name]             ACCOUNT NO.:  [id number]  MEDICAL RECORD NO.:  [id number]           PATIENT TYPE:  O  LOCATION:  NUC                          FACILITY:  [facility]  PHYSICIAN:  Ollen Gross. Vernell Morgans, M.D. DATE OF BIRTH:  22-Mar-1930  DATE OF PROCEDURE:  04/25/2010 DATE OF DISCHARGE:                              OPERATIVE REPORT   PREOPERATIVE DIAGNOSIS:  Right breast cancer.  POSTOPERATIVE DIAGNOSIS:  Right breast cancer.  PROCEDURE:  Right breast needle-localized lumpectomy and sentinel node biopsy x1 with injection of blue dye.  SURGEON:  Ollen Gross. Vernell Morgans, MD  ANESTHESIA:  General via LMA.  PROCEDURE:  After informed consent was obtained, the patient was brought to the operating room and placed in the supine position on the table. After adequate induction of general anesthesia, the patient's right chest, breast, and axillary area were all prepped with ChloraPrep, allowed to dry, and then draped in usual sterile manner.  Earlier in the day, the patient had undergone injection of 1 mCi of technetium sulfur colloid in the subareolar position.  Also earlier in the day, the patient had undergone a wire localization procedure and the wire was entering the upper outer aspect of the right breast and the wire seemed to be heading fairly straight in.  At this point, 2 mL of methylene blue and 3 mL of injectable saline were also injected in the subareolar position.  The breast was massaged for several minutes.  A NeoProbe was used to identify a hot spot in the right axilla.  A small transverse incision was made with a 15 blade knife overlying the hot spot.  This incision was carried down through the skin and subcutaneous tissue sharply with the electrocautery until the axilla was entered.  A Weitlaner retractor was deployed.  Using blunt dissection and the NeoProbe to guide the dissection, a blue lymph node was identified.  It was hot but not blue.  It was excised by a  combination of some sharp dissection with the Bovie electrocautery and then the lymphatics were clamped with hemostats, divided, and ligated 3-0 Vicryl ties.  Ex vivo counts on sentinel node #1 were 500.  No other hot, blue, or palpable lymph nodes were identified in the right axilla.  At this point, the deep layer of the right axilla was closed with interrupted 3-0 Vicryl stitches and the skin was closed with a running 4-0 Monocryl subcuticular stitch.  Attention was then turned to the right breast. Sort of a transversely oriented curvilinear incision was made just medial to the wire with a 15-blade knife.  This incision was carried down through the skin and subcutaneous tissue sharply with the electrocautery.  Once into the breast tissue, the path of the wire could be palpated.  A circular portion of breast tissue was excised sharply around the path of the wire.  This was all done sharply with the electrocautery and the dissection was taken down all the way to the chest wall.  Once the specimen was removed, it was oriented according to the assigned paint colors and the specimen radiograph showed the clip and wire to be  in the center of the specimen.  It was then sent to pathology for further evaluation.  Hemostasis was achieved using Bovie electrocautery.  Both wounds were infiltrated with 0.25% Marcaine.  The deep layer of the breast wound was then closed with interrupted 3-0 Vicryl stitches and the skin was closed with a running 4-0 Monocryl subcuticular stitch.  Dermabond dressings were applied.  The patient tolerated the procedure well.  At the end of the case, all needle, sponge, instrument counts were correct.  The patient was awakened, taken to recovery in stable condition.     Ollen Gross. Vernell Morgans, M.D.     PST/MEDQ  D:  04/25/2010  T:  04/26/2010  Job:  [id number]  Electronically Signed by Chevis Pretty III M.D. on 04/27/2010 07:39:55 AM

## 2010-05-15 ENCOUNTER — Encounter: Payer: Self-pay | Admitting: Internal Medicine

## 2010-05-15 ENCOUNTER — Ambulatory Visit (INDEPENDENT_AMBULATORY_CARE_PROVIDER_SITE_OTHER): Payer: MEDICARE | Admitting: Internal Medicine

## 2010-05-15 VITALS — BP 180/100 | HR 75 | Temp 97.7°F | Ht 61.0 in | Wt 166.0 lb

## 2010-05-15 DIAGNOSIS — D509 Iron deficiency anemia, unspecified: Secondary | ICD-10-CM

## 2010-05-15 DIAGNOSIS — C50919 Malignant neoplasm of unspecified site of unspecified female breast: Secondary | ICD-10-CM

## 2010-05-15 DIAGNOSIS — I10 Essential (primary) hypertension: Secondary | ICD-10-CM

## 2010-05-15 DIAGNOSIS — K7689 Other specified diseases of liver: Secondary | ICD-10-CM

## 2010-05-15 DIAGNOSIS — K760 Fatty (change of) liver, not elsewhere classified: Secondary | ICD-10-CM

## 2010-05-15 DIAGNOSIS — K8020 Calculus of gallbladder without cholecystitis without obstruction: Secondary | ICD-10-CM

## 2010-05-17 ENCOUNTER — Encounter: Payer: Self-pay | Admitting: Internal Medicine

## 2010-05-17 DIAGNOSIS — K760 Fatty (change of) liver, not elsewhere classified: Secondary | ICD-10-CM | POA: Insufficient documentation

## 2010-05-17 DIAGNOSIS — K8020 Calculus of gallbladder without cholecystitis without obstruction: Secondary | ICD-10-CM | POA: Insufficient documentation

## 2010-05-17 NOTE — Assessment & Plan Note (Signed)
Now normal with normal IBC slightly elevated RDW.  Follow up periodically

## 2010-05-17 NOTE — Assessment & Plan Note (Signed)
Her surgery was in February and she's has yet to hear from oncology. We made a phone call in the clinic had to leave a message with the supervisor that she should be called tomorrow about her appointment time. The patient is instructed to call us if not contacted this week by oncology.

## 2010-05-17 NOTE — Progress Notes (Signed)
  Subjective:    Patient ID: [name], female    DOB: [birth year], 77 y.o.   MRN: [id number]  HPI  patient comes in today for followup as a same day appointment for her anemia. In the interim since her last visit she has been diagnosed with localized lobular invasive breast cancer and has undergone right lumpectomy with negative lymph node. Her surgery was on February 21. Her followup to the surgeon was unremarkable. However she is still not heard from oncology referral having to have been made by the surgeon and wonders what to do next.   In regard to her anemia she has had no unusual bleeding still taking iron as needed.  Hypertension had been controlled has not been taking her medicine regularly and has been eating a little indiscreet lesion such as eating sausage and high salt kids. She thinks her blood pressure will be better if she does the correct thing. Has a single gallstoine without sx and going to wait on treating that . PMH and family hx reviewed in [employer] EHR  NOT yet abstracted  into EPIC.  Review of Systems  no chest pain shortness of breath change in vision fainting bleeding. Rest non c    Objective:   Physical Exam  well-developed well-nourished in no acute distress HEENT is grossly normal neck supple without adenopathy chest CTAP S. Equal cardiac S1-S2 no gallops or murmurs.   Blood pressure: left 148/82 and right 150/74 sitting  Slight 1+ edema  extermities   Reviewed record   Path  . Labs  Call put into oncology cancer center about   Pending referral    Some one is to call her tomorrow about this.       Assessment & Plan:  HT take your med    And rov in 3 months  Or as needed  Breast CA Anemia iron deficiency  better     AVM  Treated  And should continue to do well with this.

## 2010-05-17 NOTE — Assessment & Plan Note (Signed)
She admits to non adherance recently has not been taking med this am  and not eating as healthy in the last week. She thinks that her readings come down when she does these things.  Repeat readings in office were in the 148 range

## 2010-05-24 ENCOUNTER — Emergency Department (HOSPITAL_COMMUNITY)
Admission: EM | Admit: 2010-05-24 | Discharge: 2010-05-24 | Disposition: A | Discharge status: Home / Self Care | Payer: MEDICARE | Attending: Emergency Medicine | Admitting: Emergency Medicine

## 2010-05-24 DIAGNOSIS — Z853 Personal history of malignant neoplasm of breast: Secondary | ICD-10-CM | POA: Insufficient documentation

## 2010-05-24 DIAGNOSIS — I10 Essential (primary) hypertension: Secondary | ICD-10-CM | POA: Insufficient documentation

## 2010-05-24 DIAGNOSIS — R040 Epistaxis: Secondary | ICD-10-CM | POA: Insufficient documentation

## 2010-05-24 DIAGNOSIS — M79609 Pain in unspecified limb: Secondary | ICD-10-CM | POA: Insufficient documentation

## 2010-05-25 ENCOUNTER — Encounter (HOSPITAL_BASED_OUTPATIENT_CLINIC_OR_DEPARTMENT_OTHER): Payer: MEDICARE | Admitting: Oncology

## 2010-05-25 ENCOUNTER — Ambulatory Visit: Payer: MEDICARE | Attending: Radiation Oncology | Admitting: Radiation Oncology

## 2010-05-25 ENCOUNTER — Other Ambulatory Visit: Payer: Self-pay | Admitting: Oncology

## 2010-05-25 DIAGNOSIS — J45909 Unspecified asthma, uncomplicated: Secondary | ICD-10-CM | POA: Insufficient documentation

## 2010-05-25 DIAGNOSIS — C50419 Malignant neoplasm of upper-outer quadrant of unspecified female breast: Secondary | ICD-10-CM

## 2010-05-25 DIAGNOSIS — Z79899 Other long term (current) drug therapy: Secondary | ICD-10-CM | POA: Insufficient documentation

## 2010-05-25 DIAGNOSIS — I10 Essential (primary) hypertension: Secondary | ICD-10-CM | POA: Insufficient documentation

## 2010-05-25 DIAGNOSIS — K219 Gastro-esophageal reflux disease without esophagitis: Secondary | ICD-10-CM | POA: Insufficient documentation

## 2010-05-25 LAB — COMPREHENSIVE METABOLIC PANEL
ALT: 10 U/L (ref 0–35)
AST: 16 U/L (ref 0–37)
Albumin: 4.1 g/dL (ref 3.5–5.2)
Alkaline Phosphatase: 63 U/L (ref 39–117)
BUN: 12 mg/dL (ref 6–23)
CO2: 26 mEq/L (ref 19–32)
Calcium: 9.2 mg/dL (ref 8.4–10.5)
Chloride: 101 mEq/L (ref 96–112)
Creatinine, Ser: 0.81 mg/dL (ref 0.40–1.20)
Glucose, Bld: 80 mg/dL (ref 70–99)
Potassium: 3.5 mEq/L (ref 3.5–5.3)
Sodium: 141 mEq/L (ref 135–145)
Total Bilirubin: 0.3 mg/dL (ref 0.3–1.2)
Total Protein: 7.1 g/dL (ref 6.0–8.3)

## 2010-05-25 LAB — CBC WITH DIFFERENTIAL/PLATELET
BASO%: 0.3 % (ref 0.0–2.0)
Basophils Absolute: 0 10*3/uL (ref 0.0–0.1)
EOS%: 3.7 % (ref 0.0–7.0)
Eosinophils Absolute: 0.3 10*3/uL (ref 0.0–0.5)
HCT: 39.8 % (ref 34.8–46.6)
HGB: 13 g/dL (ref 11.6–15.9)
LYMPH%: 31.7 % (ref 14.0–49.7)
MCH: 27.3 pg (ref 25.1–34.0)
MCHC: 32.7 g/dL (ref 31.5–36.0)
MCV: 83.6 fL (ref 79.5–101.0)
MONO#: 0.5 10*3/uL (ref 0.1–0.9)
MONO%: 6.6 % (ref 0.0–14.0)
NEUT#: 4.3 10*3/uL (ref 1.5–6.5)
NEUT%: 57.7 % (ref 38.4–76.8)
Platelets: 351 10*3/uL (ref 145–400)
RBC: 4.76 10*6/uL (ref 3.70–5.45)
RDW: 14.2 % (ref 11.2–14.5)
WBC: 7.5 10*3/uL (ref 3.9–10.3)
lymph#: 2.4 10*3/uL (ref 0.9–3.3)

## 2010-07-18 NOTE — Assessment & Plan Note (Signed)
[facility]                            CARDIOLOGY OFFICE NOTE   NAME:[name], [name]                    MRN:          [id number]  DATE:05/16/2007                            DOB:          Feb 24, 1931    [name] is a delightful 77 year old widowed African American  female who comes today for some chest discomfort.   We saw her in 2004, at which time she had negative stress Myoview.  At  that time she had an abnormal EKG with poor progression across anterior  precordium and had several risk factors.  There was a question of  whether or not she had some scar on her stress Myoview at the distal  anterior wall and apex.  We obtained a 2-D echocardiogram which  basically showed normal left ventricular function and no evidence of  wall motion abnormalities.   She has been having some shooting pains in the left chest that last  seconds.  She does have some dyspnea on exertion.   PAST MEDICAL HISTORY:  Her past medical history is significant for no  tobacco use.  She does sometimes drink some alcohol.  She drinks 2 cups  of caffeinated beverage a day.  She does have elevated cholesterol and  is now on a statin.  I tried to talk her into one 4 years ago.  She does  not exercise.  She has had she had hypertension since age 10.   ALLERGIES:  She has no dye reaction.  She has no allergies to  medications.   CURRENT MEDICATIONS:  Potassium 20 mEq a day, VESIcare 5 mg a day,  Allegra 180 mg daily, __________ 500/50 one inhalation puff daily,  Singulair 10 mg at bedtime, lisinopril HCTZ 20/12.5 daily, amlodipine  besylate 5 mg daily, simvastatin 20 mg a day, omeprazole 20 mg daily,  enteric-coated aspirin 325 mg a day.   SURGICAL HISTORY:  Tonsillectomy, hip replacement.  Palate surgery for  snoring.  She does not wear CPAP as best I can tell.   FAMILY HISTORY:  Remarkable for no premature coronary disease.   SOCIAL HISTORY:  She is retired.   She has 5 children.  She is widowed.   REVIEW OF SYSTEMS:  Other than the HPI, she has some seasonal allergies,  asthma, hiatal hernia, reflux and menstrual dysfunction.  Her other  points of care, review of systems were reviewed, and are negative.   EXAM:  Her blood pressure is 142/86, her pulse is 73 and regular.  She  is 5 feet 1 inch and weighs 178 pounds.  HEENT:  Normocephalic, atraumatic.  PERRLA.  Extraocular movements  intact.  Sclerae are slightly icteric appearing or discolored.  Her  facial symmetry is normal.  Dentition is satisfactory.  NECK:  Supple.  Carotid upstrokes were equal bilaterally with bruits, no  JVD.  Thyroid is not enlarged.  Trachea is midline.  LUNGS:  Clear.  HEART:  Reveals a nondisplaced PMI.  She has a soft S1, S2 without  gallop or murmur.  ABDOMEN:  Soft, good bowel sounds.  No midline bruit.  No hepatomegaly.  EXTREMITIES:  No cyanosis, clubbing or edema.  Pulses are intact.  NEURO EXAM:  Intact.  SKIN:  Unremarkable.   Electrocardiogram shows normal sinus rhythm with poor progression across  the anterior precordium.  It has not changed significantly from last  time.   ASSESSMENT/PLAN:  [name] discomfort is most likely noncoronary.  However, she clearly has risk factors and had dyspnea on exertion.  I we  should rule out any obstructive coronary disease.  I will arrange an  adenosine rest stress Myoview.  If this is negative for ischemia, we  will see her back on a p.r.n. basis.     Thomas C. Daleen Squibb, MD, [facility]  Electronically Signed    TCW/MedQ  DD: 05/16/2007  DT: 05/17/2007  Job #: [id number]   cc:   Neta Mends. Fabian Sharp, MD

## 2010-07-21 NOTE — Discharge Summary (Signed)
NAMELUCENDIA, LEARD             ACCOUNT NO.:  [id number]   MEDICAL RECORD NO.:  [id number]          PATIENT TYPE:  INP   LOCATION:  0452                         FACILITY:  [facility]   PHYSICIAN:  Georges Lynch. Gioffre, M.D.DATE OF BIRTH:  Jan 04, 1931   DATE OF ADMISSION:  03/07/2004  DATE OF DISCHARGE:  03/10/2004                                 DISCHARGE SUMMARY   ADMISSION DIAGNOSES:  1.  Degenerative arthritis, right hip.  2. Hypertension.  3.      Gastroesophageal reflux disease.   DISCHARGE DIAGNOSES:  1.  Degenerative arthritis, right hip, status post right total hip      arthroplasty.  2. Hypertension.  3. Gastroesophageal reflux disease.   PROCEDURE:  The patient was taken to the operating room on 03/07/04 to undergo  a right total hip arthroplasty.  Surgeon was Worthy Rancher, M.D., assistant  Edmon Crape, M.D.  The surgery was performed under general anesthesia.   CONSULTATIONS:  Physical therapy, occupational therapy, rehabilitation.   BRIEF HISTORY:  This patient is a 77 year old female who has had hip pain  for the past several months.  It has gotten much worse over the past six  weeks.  She has been ambulating with pain.  She has a very restricted range  of motion and pain with motion.  She had known degenerative arthritis of her  right hip and was anticipating a right total hip arthroplasty.  She elected  to hold off until after the holidays.  Once the holidays passed she was  ready to schedule surgery and was admitted to [facility] for right  total hip arthroplasty.   LABORATORY DATA:  Admission CBC: WBC 7.4, RBC 4.52, hemoglobin 12.2,  hematocrit 37.5, platelet count 384.  Admission PT 12.2, INR 0.9, PTT 31.  Admission chemistries sodium 139, potassium 3.2, slightly low, chloride 100,  CO2 30, glucose 97, BUN 4, creatinine 0.6, calcium 9.2.  Total protein 6.8,  albumin 3.3, slightly low.  Admission urinalysis normal.  The patient's  blood type is O+.   Negative antibody screen.   Preoperative x-ray of right hip revealed severe osteoarthritis, right hip.  Preoperative chest x-ray showed borderline heart size, no evidence of acute  disease.  Probable hiatal hernia.  Postoperative echo, right hip, revealed  good position and alignment of right total hip prosthesis.  The patient's  hemoglobin and hematocrit were followed throughout her hospitalization.  She  had a postoperative drop in her hemoglobin to 8.6 but that stabilized prior  to transfer to [facility] and she did not require blood transfusions at [facility].   HOSPITAL COURSE:  The patient was admitted to [facility].  She was taken to  the operating room.  She underwent the above stated procedure without  complications.  She tolerated the procedure well and was allowed to return  to the recovery room and then the orthopedic floor to continue her  postoperative care.  The patient was placed on PC analgesia for pain  control.  The patient's pain was well controlled.  She was encouraged to use  oral analgesics.  The patient was  gradually weaned over to oral analgesics  and the PCA was discontinued on postoperative day #3.  PT was consulted for  gait training ambulation.  The patient was a little slow to progress and it  was felt that she could benefit from possible rehabilitation or SACU  admission.  She was evaluated by rehabilitation and it was felt that she met  the criteria.  A rehabilitation bed became available on 1/6 and she was  transferred to the [facility] via [facility].   TRANSFER MEDICATIONS:  The patient will resume her home medications as well  as Percocet 10/650 1-2 q.6h. as needed for pain, Coumadin per pharmacy  protocol, Robaxin 500 mg 1 q.6h. as needed for her spasms.   WEIGHTBEARING STATUS:  Touchdown weightbearing with walker.   DIET:  As tolerated.   FOLLOWUP:  The patient needs to follow up with Dr. Darrelyn Hillock in two weeks from  the date of surgery.  Her sutures need to  be removed two weeks from the date  of surgery.   CONDITION ON TRANSFER:  Stable.      LKP/MEDQ  D:  04/12/2004  T:  04/12/2004  Job:  [id number]

## 2010-07-21 NOTE — H&P (Signed)
[name], [name]             ACCOUNT NO.:  [id number]   MEDICAL RECORD NO.:  [id number]          PATIENT TYPE:  INP   LOCATION:  NA                           FACILITY:  [facility]   PHYSICIAN:  Georges Lynch. Gioffre, M.D.DATE OF BIRTH:  June 27, 1930   DATE OF ADMISSION:  03/07/2004  DATE OF DISCHARGE:                                HISTORY & PHYSICAL   HISTORY:  The patient has had right hip pain for the past several months.  It has gotten much worse over the past 6 weeks.  She is ambulating with a  pain.  She has very painful and restricted range of motion.  She has no  degenerative arthritis in the right hip, and she has anticipated a hip  replacement.  She was trying to get through the holidays.  The holidays have  passed, and she is ready to schedule surgery, and she will be admitted to  the hospital for a right total hip arthroplasty.   ALLERGIES:  None.   PRIMARY CARE PHYSICIAN:  Dr. Berniece Andreas   PAST MEDICAL HISTORY:  1.  Arthritis.  2.  Hypertension.  3.  Gastroesophageal reflux disease.  4.  Degenerative arthritis.   CURRENT MEDICATIONS:  1.  Trazodone 50 mg h.s. for sleep.  2.  Norvasc 5 mg daily.  3.  Potassium 20 mEq daily.  4.  Vesicare 5 mg daily.  5.  Allegra 180 mg daily.  6.  Advair 500/50, 1 puff daily.  7.  Singulair 10 mg daily.  8.  Lisinopril 40 mg daily.  9.  HCTZ 25 mg daily.   PAST FAMILY HISTORY:  Significant for mother died of ovarian cancer.   REVIEW OF SYSTEMS:  GENERAL:  Denies weight change, fever, chills, fatigue.  HEENT:  Denies headache, visual changes, tinnitus, hearing loss, sore  throat.  CARDIOVASCULAR:  Denies chest pain, palpitations, shortness of  breath, orthopnea.  PULMONARY:  Denies dyspnea, wheezing, cough, sputum  production, or hemoptysis.  GI:  Denies nausea, vomiting, hematemesis, or  abdominal pain.  GU:  Denies dysuria, frequency, urgency.  ENDOCRINE:  Denies polyuria, polydipsia, appetite change, heat or cold  intolerance.  MUSCULOSKELETAL:  The patient has right hip pain as well as left knee pain.  NEUROLOGIC:  Denies dizziness, vertigo, syncope, seizure.  SKIN:  Denies  itching, rashes, masses, or moles.   PHYSICAL EXAMINATION:  VITAL SIGNS:  Temperature 98.8, pulse 68,  respirations 18, blood pressure 120/70.  GENERAL:  A 77 year old female in no acute distress.  HEENT:  PERRL.  EOM's intact.  Throat is clear.  NECK:  Supple without masses.  CHEST:  Clear to auscultation bilaterally.  No wheezing, rales, or rhonchi  noted.  HEART:  Regular rate and rhythm without murmur.  ABDOMEN:  Positive bowel sounds, soft, nontender.  EXTREMITIES:  Examination of her right hip reveals marked restricted range  of motion, painful range of motion.  SKIN:  Warm and dry.   X-ray of her right hip shows complete collapse of the right femoral head.   IMPRESSION:  Degenerative arthritis, right hip.   PLAN:  The patient is  to be admitted to [facility], March 07, 2004, to undergo a right total hip arthroplasty.      LKP/MEDQ  D:  03/03/2004  T:  03/03/2004  Job:  [id number]

## 2010-07-21 NOTE — Op Note (Signed)
[name], [name]             ACCOUNT NO.:  [id number]   MEDICAL RECORD NO.:  [id number]          PATIENT TYPE:  INP   LOCATION:  X002                         FACILITY:  [facility]   PHYSICIAN:  Georges Lynch. Gioffre, M.D.DATE OF BIRTH:  August 14, 1930   DATE OF PROCEDURE:  03/07/2004  DATE OF DISCHARGE:                                 OPERATIVE REPORT   PREOPERATIVE DIAGNOSIS:  Severe degenerative arthritis of the right hip with  superolateral migration of the femoral head on the right.   POSTOPERATIVE DIAGNOSIS:  Severe degenerative arthritis of the right hip  with superolateral migration of the femoral head on the right.   PROCEDURE:  Right total hip arthroplasty utilizing the [employer] system.  We  utilized a size 46 Trident acetabular shell with a 28-mm diameter  polyethylene 0-degree liner.  Two screws were used for fixation of the metal  shell.  We used an Accolade femoral component, a size 2, and the femoral C  tapered head was a +4, 28-mm C tapered head.   SURGEON:  Georges Lynch. Darrelyn Hillock, M.D.   ASSISTANT:  Madlyn Frankel. Charlann Boxer, M.D.   DESCRIPTION OF PROCEDURE:  Under general anesthesia, routine orthopedic  prepping and draping was carried out of the right hip.  The patient had 1 g  of IV Ancef preoperatively.   A posterolateral approach to the right hip was carried out.  Bleeders were  identified and cauterized.  At this time, self-retaining retractors were  inserted.  I then went down and incised the iliotibial band, detached the  external rotators, with great care taken not to injure the underlying  sciatic nerve.  The patient obviously had shortening of her right lower  extremity as compared to the left because of superolateral migration.  The  capsule was then excised posteriorly.  We preserved the anterior capsule.  I  went down and dislocated the head and amputated the femoral neck at the  appropriate neck level.  We then carried out our reaming and rasping,  rasping for the  Accolade stem up to a size 2.  We then utilized the calcar  reamer to even out the calcar at this time.  We then reamed out acetabulum  up to a size 46-mm cup.  We went through our trials and had good stability.  We then inserted our permanent 46-mm Trident cup.  We went through trials  once again.  We noted with different neck length that we tried that her  greater trochanter was extremely close to the ischium.  Her ischium was  markedly hypertrophic.  We did shave down some of the ischium with the  osteotome to give her a little more room.  We went through the various  trials with a +0, +4, +8 for stability purposes, and the least length that  we could put in was a +4 for stability purposes.  We could not go any  shorter on her.  We even had a 127-degree offset femoral component in her as  well.  Following this and after we removed the trials, we then inserted our  permanent 0-degree liner, 28-mm in  diameter, after the two screws were  inserted in the cup.  Following this, we inserted our permanent Accolade  size 2 into the femoral shaft.  We went through trials once again and felt  that the only stable construct at this time was a +4 C tapered head.  We  could not go down to a +0 because the head really would have been out of the  acetabulum.  We did spend a great deal of time trying to equalize her leg  length at the same time to prevent her from abutting the greater trochanter  up against the ischium.  We thoroughly irrigated out the area after we had  the hip reduced.  We had a good stable hip.  There was  no impingement in regards to taking the hip through a range of motion.  Following this, we closed the wound in layers in the usual fashion.  The  skin was closed with metal staples.  A sterile Neosporin dressing was  applied.   The patient left the operating room in satisfactory condition.      RAG/MEDQ  D:  03/07/2004  T:  03/07/2004  Job:  [id number]

## 2010-07-21 NOTE — Procedures (Signed)
NAME:  [name], [name]           ACCOUNT NO.:  [id number]   MEDICAL RECORD NO.:  [id number]          PATIENT TYPE:  OUT   LOCATION:  SLEEP CENTER                 FACILITY:  [facility]   PHYSICIAN:  Marcelyn Bruins, M.D. [facility] DATE OF BIRTH:  14-Feb-1931   DATE OF ADMISSION:  09/22/2003  DATE OF DISCHARGE:  09/22/2003                              NOCTURNAL POLYSOMNOGRAM   REFERRING PHYSICIAN:  Dr. Berniece Andreas   INDICATION FOR THE STUDY:  Hypersomia with sleep apnea.   SLEEP ARCHITECTURE:  The patient had a total sleep time of 294 minutes with  significantly decreased REM and slow wave sleep.  Sleep onset latency was  normal, and REM latency was mildly prolonged.   IMPRESSIONS:  1. Moderate to severe obstructive sleep apnea syndrome with a respiratory     disturbance index of 35 events per hour, and O2 desaturation as low as     87%.  The patient did not meet split night protocol since the majority of     her events occurred in the latter half of  the night.  Events did not     seem to be supine position nor REM related.  2. Moderate snoring noted throughout the study.  3. No clinically significant cardiac arrythmia.  4. Large numbers of periodic leg movements with significant sleep     disruption.  Clinical correlation is suggested.  ? Does the patient have the symptoms for the restless leg syndrome?                                   ______________________________                                Marcelyn Bruins, M.D. [facility]     KC/MEDQ  D:  09/24/2003 16:21:51  T:  09/24/2003 17:22:57  Job:  [id number]

## 2010-07-21 NOTE — Discharge Summary (Signed)
[name], [name]             ACCOUNT NO.:  [id number]   MEDICAL RECORD NO.:  [id number]          PATIENT TYPE:  ORB   LOCATION:  4502                         FACILITY:  [facility]   PHYSICIAN:  Ellwood Dense, M.D.   DATE OF BIRTH:  05-04-1930   DATE OF ADMISSION:  03/10/2004  DATE OF DISCHARGE:  03/21/2004                                 DISCHARGE SUMMARY   DISCHARGE DIAGNOSES:  1.  Right total hip arthroplasty secondary to osteoarthritis on March 07, 2004.  2.  Pain management.  3.  Coumadin for deep venous thrombosis prophylaxis.  4.  Anemia.  5.  Chronic obstructive pulmonary disease.  6.  Hypertension.  7.  Hypertonic bladder.  8.  Sleep apnea.   HISTORY OF PRESENT ILLNESS:  This is a 77 year old, African-American female  admitted to [facility] on January 3, with advanced right hip pain  for several months.  X-rays of advanced osteoarthritis.  No relief with  conservative care.  Underwent a right total hip arthroplasty on January 3,  per Dr. Georges Lynch. Gioffre.  She was placed on Coumadin for deep venous  thrombosis prophylaxis and partial weightbearing.  Postoperative anemia 8.6  and monitored.  Admitted to subacute care services.   PAST MEDICAL HISTORY:  See discharge diagnoses.   ALLERGIES:  No known drug allergies.   SOCIAL HISTORY:  Lives alone in [place].  One-level home with four to six  steps to entry.  Multiple children who assist as needed on discharge.   MEDICATIONS PRIOR TO ADMISSION:  1.  Trazodone 50 mg at bedtime.  2.  Norvasc 5 mg daily.  3.  Potassium chloride 20 mEq daily.  4.  Vesicare 5 mg daily.  5.  Allegra 180 mg daily.  6.  Advair one puff daily.  7.  Singulair 10 mg daily.  8.  Lisinopril 40 mg daily.  9.  Hydrochlorothiazide 25 mg daily.   HOSPITAL COURSE:  The patient with progressive gains while on rehabilitation  services with therapies initiated daily.  The following issues were followed  during the patient's  rehabilitation course:  Pertaining to Ms. Hosek's  right total hip arthroplasty, surgical site is healing nicely with staples  removed.  No signs of infection.  Total hip precautions underway with  partial weightbearing.  She was using Vicodin on a limited basis for pain.  Coumadin ongoing for deep venous thrombosis prophylaxis with latest INR of  2.5.  She will complete Coumadin protocol followed by [facility].  Postoperative anemia was stable with latest hemoglobin of 10.9, hematocrit  32.3.  She was transfused upon admission to subacute care services on  March 13, 2004, with 2 units of packed red blood cells for a hemoglobin of  7.7.  Chronic obstructive pulmonary disease remained asymptomatic on home  medicines of Advair and Singulair.  Blood pressures were monitored with  hydrochlorothiazide, lisinopril and Norvasc with no orthostatic changes  noted.  She did have a documented history of sleep apnea.  She had not used  her CPAP machine while in the hospital at patient's request.  She would  resume this at home.  Her oxygen saturations at night were 92% on room air.   Overall, for her functional mobility, she was ambulating 40 feet with a  rolling walker at minimal assist.  Minimal assist for bed mobility and  transfers.  Simple set up for upper body activities of daily living.  Home  health therapies would be ongoing.   DISCHARGE MEDICATIONS:  1.  Coumadin with latest dose of 4 mg to be completed on April 07, 2004.  2.  Allegra 180 mg daily.  3.  Hydrochlorothiazide 25 mg daily.  4.  Lisinopril 40 mg daily.  5.  Singulair 10 mg daily.  6.  Trinsicon one capsule twice daily.  7.  Norvasc 5 mg daily.  8.  Protonix 40 mg daily.  9.  Advair 500/50 one puff twice daily.  10. Multivitamin daily.  11. Vesicare one tablet daily.  12. Potassium chloride 20 mEq daily.  13. Vicodin as needed for pain.   ACTIVITY:  Partial weightbearing with hip precautions.   DIET:   Regular.   WOUND CARE:  Cleanse incision daily with warm soap and water.   SPECIAL INSTRUCTIONS:  Home health nurse per [facility] to complete  Coumadin protocol.   FOLLOW UP:  She should follow up with Dr. Georges Lynch. Gioffre, at [phone],  orthopedic services.       DA/MEDQ  D:  03/20/2004  T:  03/20/2004  Job:  [id number]   cc:   Windy Fast A. Darrelyn Hillock, M.D.  [facility]  [address]  [address]  [place]  [place] [address]  Fax: [phone]   Neta Mends. Fabian Sharp, M.D. [facility]

## 2010-08-23 ENCOUNTER — Ambulatory Visit: Payer: MEDICARE | Admitting: Internal Medicine

## 2010-08-24 ENCOUNTER — Telehealth: Payer: Self-pay | Admitting: Internal Medicine

## 2010-08-24 NOTE — Telephone Encounter (Signed)
Pt called re: appt on 08/23/10. Pt said that she had cx this appt when automated system called to remind of ov. Pls do not charge fee. Pt will rsc ov later.

## 2010-08-29 ENCOUNTER — Ambulatory Visit
Admission: RE | Admit: 2010-08-29 | Discharge: 2010-08-29 | Disposition: A | Discharge status: Home / Self Care | Payer: 59 | Source: Ambulatory Visit | Attending: Radiation Oncology | Admitting: Radiation Oncology

## 2010-09-12 ENCOUNTER — Ambulatory Visit: Payer: Self-pay | Admitting: Internal Medicine

## 2010-09-26 ENCOUNTER — Encounter: Payer: Self-pay | Admitting: Internal Medicine

## 2010-09-26 ENCOUNTER — Ambulatory Visit (INDEPENDENT_AMBULATORY_CARE_PROVIDER_SITE_OTHER): Payer: 59 | Admitting: Internal Medicine

## 2010-09-26 VITALS — BP 120/80 | HR 78 | Wt 163.0 lb

## 2010-09-26 DIAGNOSIS — C50919 Malignant neoplasm of unspecified site of unspecified female breast: Secondary | ICD-10-CM

## 2010-09-26 DIAGNOSIS — M1990 Unspecified osteoarthritis, unspecified site: Secondary | ICD-10-CM

## 2010-09-26 DIAGNOSIS — K8020 Calculus of gallbladder without cholecystitis without obstruction: Secondary | ICD-10-CM

## 2010-09-26 DIAGNOSIS — D509 Iron deficiency anemia, unspecified: Secondary | ICD-10-CM

## 2010-09-26 DIAGNOSIS — R609 Edema, unspecified: Secondary | ICD-10-CM

## 2010-09-26 DIAGNOSIS — E785 Hyperlipidemia, unspecified: Secondary | ICD-10-CM

## 2010-09-26 DIAGNOSIS — K219 Gastro-esophageal reflux disease without esophagitis: Secondary | ICD-10-CM

## 2010-09-26 DIAGNOSIS — I10 Essential (primary) hypertension: Secondary | ICD-10-CM

## 2010-09-26 DIAGNOSIS — E559 Vitamin D deficiency, unspecified: Secondary | ICD-10-CM

## 2010-09-26 DIAGNOSIS — N318 Other neuromuscular dysfunction of bladder: Secondary | ICD-10-CM

## 2010-09-26 LAB — HEPATIC FUNCTION PANEL
ALT: 18 U/L (ref 0–35)
AST: 21 U/L (ref 0–37)
Albumin: 3.8 g/dL (ref 3.5–5.2)
Alkaline Phosphatase: 65 U/L (ref 39–117)
Bilirubin, Direct: 0.1 mg/dL (ref 0.0–0.3)
Total Bilirubin: 0.4 mg/dL (ref 0.3–1.2)
Total Protein: 7.4 g/dL (ref 6.0–8.3)

## 2010-09-26 LAB — BASIC METABOLIC PANEL
BUN: 13 mg/dL (ref 6–23)
CO2: 30 mEq/L (ref 19–32)
Calcium: 8.9 mg/dL (ref 8.4–10.5)
Chloride: 107 mEq/L (ref 96–112)
Creatinine, Ser: 0.7 mg/dL (ref 0.4–1.2)
GFR: 109.39 mL/min (ref >60.00)
Glucose, Bld: 82 mg/dL (ref 70–99)
Potassium: 3.4 mEq/L — ABNORMAL LOW (ref 3.5–5.1)
Sodium: 142 mEq/L (ref 135–145)

## 2010-09-26 LAB — IBC PANEL
Iron: 64 ug/dL (ref 42–145)
Saturation Ratios: 19.3 % — ABNORMAL LOW (ref 20.0–50.0)
Transferrin: 236.9 mg/dL (ref 212.0–360.0)

## 2010-09-26 LAB — CBC WITH DIFFERENTIAL/PLATELET
Basophils Absolute: 0 10*3/uL (ref 0.0–0.1)
Basophils Relative: 0.3 % (ref 0.0–3.0)
Eosinophils Absolute: 0.3 10*3/uL (ref 0.0–0.7)
Eosinophils Relative: 3.7 % (ref 0.0–5.0)
HCT: 38.2 % (ref 36.0–46.0)
Hemoglobin: 12.6 g/dL (ref 12.0–15.0)
Lymphocytes Relative: 15.6 % (ref 12.0–46.0)
Lymphs Abs: 1.1 10*3/uL (ref 0.7–4.0)
MCHC: 33.1 g/dL (ref 30.0–36.0)
MCV: 84.7 fl (ref 78.0–100.0)
Monocytes Absolute: 0.6 10*3/uL (ref 0.1–1.0)
Monocytes Relative: 8.2 % (ref 3.0–12.0)
Neutro Abs: 5.1 10*3/uL (ref 1.4–7.7)
Neutrophils Relative %: 72.2 % (ref 43.0–77.0)
Platelets: 262 10*3/uL (ref 150.0–400.0)
RBC: 4.51 Mil/uL (ref 3.87–5.11)
RDW: 14.5 % (ref 11.5–14.6)
WBC: 7.1 10*3/uL (ref 4.5–10.5)

## 2010-09-26 LAB — MAGNESIUM: Magnesium: 2.4 mg/dL (ref 1.5–2.5)

## 2010-09-26 NOTE — Patient Instructions (Signed)
Will notify you  of labs when available. Then plan follow up Or wellness visit in 6 months .

## 2010-09-26 NOTE — Progress Notes (Signed)
  Subjective:    Patient ID: [name], female    DOB: 01/27/1931, 77 y.o.   MRN: [id number]  HPI Patient comes in today for fu of  multiple medical problems   BP readings ok taking meds not taking tht potassium Anemia no gi bleeding at present Has gallstones  Incidental finding for abd pain  And no  Sx of same Lipids no change ofmeds UI   Stable  Vit d still taking high dose needs recheck GERD only taking prn meds  Review of Systems No cp sob cough bleeding ha change in vision and no falling.asthma stable. Feels pretty well considering.   GERD taking rpn prilosec and only ocassional need  Past history family history social history reviewed in the electronic medical record.     Objective:   Physical Exam WDWN in nad Looks well Neck no masses Chest:  Clear to A&P without wheezes rales or rhonchi CV:  S1-S2 no gallops or murmurs peripheral perfusion is normal Legs 1 + edema  No change no ulcer or redness Joints no acute swelling      Assessment & Plan:  HT    Not taking the potassium Edema  Ongoing  eval over the years and not responsive to diuretic  In the past  Had vascular eval and no  Organ dysfunction or liver disease  GB  Denies  Any sx of such  .Marland KitchenMarland KitchenDiscussed with the patient and all questioned fully answered. She will call me if any problems arise. Risk benefit  Breast CA    Meds  VIt d   Need s level done UI taking the vesicare 3 x per week or so  GERD: only taking meds prn Anemia out of iron.

## 2010-09-27 LAB — VITAMIN D 25 HYDROXY (VIT D DEFICIENCY, FRACTURES): Vit D, 25-Hydroxy: 38 ng/mL (ref 30–89)

## 2010-09-30 ENCOUNTER — Encounter: Payer: Self-pay | Admitting: Internal Medicine

## 2010-10-02 ENCOUNTER — Encounter: Payer: Self-pay | Admitting: *Deleted

## 2010-10-25 ENCOUNTER — Encounter (INDEPENDENT_AMBULATORY_CARE_PROVIDER_SITE_OTHER): Payer: Self-pay | Admitting: General Surgery

## 2010-12-19 ENCOUNTER — Encounter (INDEPENDENT_AMBULATORY_CARE_PROVIDER_SITE_OTHER): Payer: Self-pay | Admitting: General Surgery

## 2010-12-19 ENCOUNTER — Ambulatory Visit (INDEPENDENT_AMBULATORY_CARE_PROVIDER_SITE_OTHER): Payer: Medicare Other | Admitting: General Surgery

## 2010-12-19 DIAGNOSIS — C50919 Malignant neoplasm of unspecified site of unspecified female breast: Secondary | ICD-10-CM

## 2010-12-19 NOTE — Patient Instructions (Signed)
Continue regular self exams  

## 2010-12-19 NOTE — Progress Notes (Addendum)
Subjective:     Patient ID: [name], female   DOB: 1930/08/01, 77 y.o.   MRN: [id number]  HPI The patient is a 77 year old black female who is now 9 months out from a right lumpectomy and negative sentinel node biopsy for a T1 B. N0 lobular right breast cancer. She did not receive any chemotherapy and elected not to take the entire estrogen therapy. She has some soreness of the right breast but otherwise seems to be doing well. Review of Systems  Constitutional: Negative.   HENT: Negative.   Eyes: Negative.   Respiratory: Negative.   Cardiovascular: Negative.   Gastrointestinal: Negative.   Genitourinary: Negative.   Musculoskeletal: Negative.   Skin: Negative.   Neurological: Negative.   Hematological: Negative.   Psychiatric/Behavioral: Negative.        Objective:   Physical Exam  Constitutional: She is oriented to person, place, and time. She appears well-developed and well-nourished.  HENT:  Head: Normocephalic and atraumatic.  Eyes: Conjunctivae and EOM are normal. Pupils are equal, round, and reactive to light.  Neck: Normal range of motion. Neck supple.  Cardiovascular: Normal rate, regular rhythm and normal heart sounds.   Pulmonary/Chest: Effort normal and breath sounds normal.       She has some thickening of the right breast secondary to radiation. Her right lumpectomy incision is healed nicely. She has no palpable mass of either breast. No axillary subclavicular cervical lymphadenopathy.  Abdominal: Soft. Bowel sounds are normal.  Musculoskeletal: Normal range of motion.  Neurological: She is alert and oriented to person, place, and time.  Skin: Skin is warm and dry.  Psychiatric: She has a normal mood and affect. Her behavior is normal.       Assessment:     9 months status post right lumpectomy and negative sentinel node biopsy    Plan:     At this point she will continue to do regular self exams. We will plan to see her back in about 3 months. She  has elected not to followup with Dr. Park Breed.

## 2010-12-25 ENCOUNTER — Other Ambulatory Visit: Payer: Self-pay | Admitting: Internal Medicine

## 2011-01-22 ENCOUNTER — Other Ambulatory Visit: Payer: Self-pay | Admitting: Internal Medicine

## 2011-01-22 DIAGNOSIS — Z853 Personal history of malignant neoplasm of breast: Secondary | ICD-10-CM

## 2011-01-22 DIAGNOSIS — Z9889 Other specified postprocedural states: Secondary | ICD-10-CM

## 2011-02-15 ENCOUNTER — Encounter (INDEPENDENT_AMBULATORY_CARE_PROVIDER_SITE_OTHER): Payer: Self-pay | Admitting: Surgery

## 2011-02-28 ENCOUNTER — Other Ambulatory Visit: Payer: Self-pay | Admitting: Allergy

## 2011-02-28 ENCOUNTER — Ambulatory Visit
Admission: RE | Admit: 2011-02-28 | Discharge: 2011-02-28 | Disposition: A | Discharge status: Home / Self Care | Payer: Medicare Other | Source: Ambulatory Visit | Attending: Allergy | Admitting: Allergy

## 2011-02-28 DIAGNOSIS — J209 Acute bronchitis, unspecified: Secondary | ICD-10-CM

## 2011-03-21 DIAGNOSIS — J309 Allergic rhinitis, unspecified: Secondary | ICD-10-CM | POA: Diagnosis not present

## 2011-03-23 ENCOUNTER — Other Ambulatory Visit: Payer: Self-pay

## 2011-03-23 ENCOUNTER — Other Ambulatory Visit: Payer: 59

## 2011-03-26 ENCOUNTER — Ambulatory Visit
Admission: RE | Admit: 2011-03-26 | Discharge: 2011-03-26 | Disposition: A | Discharge status: Home / Self Care | Payer: Medicare Other | Source: Ambulatory Visit | Attending: Internal Medicine | Admitting: Internal Medicine

## 2011-03-26 DIAGNOSIS — Z853 Personal history of malignant neoplasm of breast: Secondary | ICD-10-CM

## 2011-03-26 DIAGNOSIS — Z9889 Other specified postprocedural states: Secondary | ICD-10-CM

## 2011-03-27 DIAGNOSIS — J309 Allergic rhinitis, unspecified: Secondary | ICD-10-CM | POA: Diagnosis not present

## 2011-03-30 ENCOUNTER — Encounter: Payer: 59 | Admitting: Internal Medicine

## 2011-04-02 DIAGNOSIS — J309 Allergic rhinitis, unspecified: Secondary | ICD-10-CM | POA: Diagnosis not present

## 2011-04-11 ENCOUNTER — Other Ambulatory Visit (INDEPENDENT_AMBULATORY_CARE_PROVIDER_SITE_OTHER): Payer: Medicare Other

## 2011-04-11 DIAGNOSIS — Z0000 Encounter for general adult medical examination without abnormal findings: Secondary | ICD-10-CM | POA: Diagnosis not present

## 2011-04-11 DIAGNOSIS — Z79899 Other long term (current) drug therapy: Secondary | ICD-10-CM

## 2011-04-11 LAB — CBC WITH DIFFERENTIAL/PLATELET
Basophils Absolute: 0 10*3/uL (ref 0.0–0.1)
Basophils Relative: 0.5 % (ref 0.0–3.0)
Eosinophils Absolute: 0.1 10*3/uL (ref 0.0–0.7)
Eosinophils Relative: 1.7 % (ref 0.0–5.0)
HCT: 38 % (ref 36.0–46.0)
Hemoglobin: 12.4 g/dL (ref 12.0–15.0)
Lymphocytes Relative: 16.8 % (ref 12.0–46.0)
Lymphs Abs: 1.3 10*3/uL (ref 0.7–4.0)
MCHC: 32.6 g/dL (ref 30.0–36.0)
MCV: 84.6 fl (ref 78.0–100.0)
Monocytes Absolute: 0.5 10*3/uL (ref 0.1–1.0)
Monocytes Relative: 6 % (ref 3.0–12.0)
Neutro Abs: 5.7 10*3/uL (ref 1.4–7.7)
Neutrophils Relative %: 75 % (ref 43.0–77.0)
Platelets: 288 10*3/uL (ref 150.0–400.0)
RBC: 4.5 Mil/uL (ref 3.87–5.11)
RDW: 15 % — ABNORMAL HIGH (ref 11.5–14.6)
WBC: 7.7 10*3/uL (ref 4.5–10.5)

## 2011-04-11 LAB — TSH: TSH: 0.24 u[IU]/mL — ABNORMAL LOW (ref 0.35–5.50)

## 2011-04-11 LAB — POCT URINALYSIS DIPSTICK
Bilirubin, UA: NEGATIVE
Glucose, UA: NEGATIVE
Ketones, UA: NEGATIVE
Leukocytes, UA: NEGATIVE
Nitrite, UA: NEGATIVE
Protein, UA: NEGATIVE
Spec Grav, UA: 1.02
Urobilinogen, UA: 0.2
pH, UA: 7.5

## 2011-04-11 LAB — HEPATIC FUNCTION PANEL
ALT: 17 U/L (ref 0–35)
AST: 15 U/L (ref 0–37)
Albumin: 3.5 g/dL (ref 3.5–5.2)
Alkaline Phosphatase: 65 U/L (ref 39–117)
Bilirubin, Direct: 0 mg/dL (ref 0.0–0.3)
Total Bilirubin: 0.3 mg/dL (ref 0.3–1.2)
Total Protein: 7.1 g/dL (ref 6.0–8.3)

## 2011-04-11 LAB — LIPID PANEL
Cholesterol: 188 mg/dL (ref 0–200)
HDL: 63.1 mg/dL (ref >39.00)
LDL Cholesterol: 103 mg/dL — ABNORMAL HIGH (ref 0–99)
Total CHOL/HDL Ratio: 3
Triglycerides: 112 mg/dL (ref 0.0–149.0)
VLDL: 22.4 mg/dL (ref 0.0–40.0)

## 2011-04-11 LAB — BASIC METABOLIC PANEL
BUN: 8 mg/dL (ref 6–23)
CO2: 30 mEq/L (ref 19–32)
Calcium: 9.1 mg/dL (ref 8.4–10.5)
Chloride: 102 mEq/L (ref 96–112)
Creatinine, Ser: 0.8 mg/dL (ref 0.4–1.2)
GFR: 94.45 mL/min (ref >60.00)
Glucose, Bld: 76 mg/dL (ref 70–99)
Potassium: 3.2 mEq/L — ABNORMAL LOW (ref 3.5–5.1)
Sodium: 141 mEq/L (ref 135–145)

## 2011-04-12 LAB — T3, FREE: T3, Free: 2.5 pg/mL (ref 2.3–4.2)

## 2011-04-12 LAB — T4, FREE: Free T4: 0.77 ng/dL (ref 0.60–1.60)

## 2011-04-13 DIAGNOSIS — J309 Allergic rhinitis, unspecified: Secondary | ICD-10-CM | POA: Diagnosis not present

## 2011-04-18 DIAGNOSIS — J309 Allergic rhinitis, unspecified: Secondary | ICD-10-CM | POA: Diagnosis not present

## 2011-04-19 NOTE — Progress Notes (Signed)
Quick Note:  Left message to call back. ______ 

## 2011-04-20 NOTE — Progress Notes (Signed)
Quick Note:    Left message on machine to call back.  ______

## 2011-04-24 ENCOUNTER — Ambulatory Visit (INDEPENDENT_AMBULATORY_CARE_PROVIDER_SITE_OTHER): Payer: Medicare Other | Admitting: Internal Medicine

## 2011-04-24 ENCOUNTER — Encounter: Payer: Self-pay | Admitting: Internal Medicine

## 2011-04-24 VITALS — BP 120/80 | HR 66 | Ht 60.5 in | Wt 160.0 lb

## 2011-04-24 DIAGNOSIS — R7989 Other specified abnormal findings of blood chemistry: Secondary | ICD-10-CM

## 2011-04-24 DIAGNOSIS — M1990 Unspecified osteoarthritis, unspecified site: Secondary | ICD-10-CM

## 2011-04-24 DIAGNOSIS — E876 Hypokalemia: Secondary | ICD-10-CM | POA: Insufficient documentation

## 2011-04-24 DIAGNOSIS — R6889 Other general symptoms and signs: Secondary | ICD-10-CM

## 2011-04-24 DIAGNOSIS — E785 Hyperlipidemia, unspecified: Secondary | ICD-10-CM

## 2011-04-24 DIAGNOSIS — J309 Allergic rhinitis, unspecified: Secondary | ICD-10-CM | POA: Diagnosis not present

## 2011-04-24 DIAGNOSIS — I10 Essential (primary) hypertension: Secondary | ICD-10-CM

## 2011-04-24 DIAGNOSIS — D509 Iron deficiency anemia, unspecified: Secondary | ICD-10-CM

## 2011-04-24 DIAGNOSIS — K219 Gastro-esophageal reflux disease without esophagitis: Secondary | ICD-10-CM

## 2011-04-24 DIAGNOSIS — C50919 Malignant neoplasm of unspecified site of unspecified female breast: Secondary | ICD-10-CM | POA: Diagnosis not present

## 2011-04-24 DIAGNOSIS — J45909 Unspecified asthma, uncomplicated: Secondary | ICD-10-CM

## 2011-04-24 DIAGNOSIS — Z0000 Encounter for general adult medical examination without abnormal findings: Secondary | ICD-10-CM

## 2011-04-24 MED ORDER — LISINOPRIL-HYDROCHLOROTHIAZIDE 20-12.5 MG PO TABS: 1.0000 | ORAL_TABLET | Freq: Every day | ORAL | Status: DC

## 2011-04-24 MED ORDER — POTASSIUM CHLORIDE CRYS ER 20 MEQ PO TBCR: 20.0000 meq | EXTENDED_RELEASE_TABLET | Freq: Every day | ORAL | Status: DC

## 2011-04-24 MED ORDER — OMEPRAZOLE 20 MG PO CPDR: 20.0000 mg | DELAYED_RELEASE_CAPSULE | Freq: Every day | ORAL | Status: DC

## 2011-04-24 MED ORDER — AMLODIPINE BESYLATE 5 MG PO TABS: 5.0000 mg | ORAL_TABLET | Freq: Every day | ORAL | Status: DC

## 2011-04-24 NOTE — Progress Notes (Signed)
Subjective:    Patient ID: [name], female    DOB: [birth year], 78 y.o.   MRN: [id number]  HPI Patient comes in today for preventive visit and follow-up of medical issues. Update of her history since her last visit. HT taking meds and in control  Has been off the potassium recently  LIPIDS:  Taking most of the time.  GI: no bleeding   Taking omeprezole depending as prn.   Once or twice a week.  Bladder   On vesicare.  Asthma allergy stable.  Had some flare with a cold and had chest x ray  .  Double cold.     Hearing:  Ok   Vision:  No limitations at present .  Safety:  Has smoke detector and wears seat belts.  No firearms. No excess sun exposure. Sees dentist regularly.  Falls:  no  Advance directive :  Reviewed  Has one.  Memory: Felt to be good  , no concern from her or her family.  Depression: No anhedonia unusual crying or depressive symptoms  Nutrition: Eats well balanced diet; adequate calcium and vitamin D. No swallowing chewiing problems.  Injury: no major injuries in the last six months.  Other healthcare providers:  Reviewed today .  Social:  Lives alone  widowed  No pets.   Preventive parameters: up-to-date on colonoscopy, mammogram, immunizations. Including Tdap and pneumovax.  ADLS:   There are no problems or need for assistance  driving, feeding, obtaining food, dressing, toileting and bathing, managing money using phone. She is independent. Bowls and swims  Outpatient Encounter Prescriptions as of 04/24/2011  Medication Sig Dispense Refill  . amLODipine (NORVASC) 5 MG tablet TAKE 1 TABLET DAILY  90 tablet  0  . budesonide-formoterol (SYMBICORT) 160-4.5 MCG/ACT inhaler Inhale 2 puffs into the lungs as needed.        Marland Kitchen lisinopril-hydrochlorothiazide (PRINZIDE,ZESTORETIC) 20-12.5 MG per tablet TAKE 1 TABLET DAILY  90 tablet  0  . montelukast (SINGULAIR) 10 MG tablet Take 10 mg by mouth at bedtime.        Marland Kitchen olopatadine (PATANOL) 0.1 % ophthalmic  solution Place 1 drop into both eyes as needed.        Marland Kitchen omeprazole (PRILOSEC) 20 MG capsule TAKE 1 CAPSULE DAILY  90 capsule  1  . simvastatin (ZOCOR) 20 MG tablet Take 20 mg by mouth at bedtime.        . solifenacin (VESICARE) 5 MG tablet Take 5 mg by mouth daily.       . ergocalciferol (VITAMIN D2) 50000 UNITS capsule Take 50,000 Units by mouth once a week.        . Ferrous Sulfate Dried (FEOSOL) 200 (65 FE) MG TABS Take 200 mg by mouth daily.        . potassium chloride SA (K-DUR,KLOR-CON) 20 MEQ tablet Take 20 mEq by mouth daily.        Marland Kitchen DISCONTD: fexofenadine (ALLEGRA) 180 MG tablet Take 180 mg by mouth daily.           Review of Systems ROS:  GEN/ HEENT: No fever, significant weight changes sweats headaches vision problems hearing changes, CV/ PULM; No chest pain shortness of breath cough, syncope,  change in exercise tolerance. Still bowls  Couple times a wekk in [place] GI /GU: No adominal pain, vomiting, change in bowel habits. No blood in the stool. No significant GU symptoms. SKIN/HEME: ,no acute skin rashes suspicious lesions or bleeding. No lymphadenopathy, nodules, masses.  NEURO/ PSYCH:  No neurologic signs such as weakness numbness. No depression anxiety. IMM/ Allergy: No unusual infections.  Allergy .   REST of 12 system review negative except as per HPI  Past history family history social history reviewed in the electronic medical record.      Objective:   Physical Exam Physical Exam: Vital signs reviewed ZOX:WRUE is a well-developed well-nourished alert cooperative  AA female who appears her stated age in no acute distress.  HEENT: normocephalic atraumatic , Eyes: PERRL EOM's full, conjunctiva clear, Nares: paten,t no deformity discharge or tenderness., Ears: no deformity EAC's clear TMs with normal landmarks. Mouth: clear OP, no lesions, edema.  Moist mucous membranes. Dentition in adequate repair. NECK: supple without masses, thyromegaly or bruits. CHEST/PULM:   Clear to auscultation and percussion breath sounds equal no wheeze , rales or rhonchi. No chest wall deformities or tenderness. CV: PMI is nondisplaced, S1 S2 no gallops, murmurs, rubs. Peripheral pulses are full without delay.No JVD .  ABDOMEN: Bowel sounds normal nontender  No guard or rebound, no hepato splenomegal no CVA tenderness.  No hernia. Extremtities:  No clubbing cyanosis 1+ stable edema, no acute joint swelling or redness no focal atrophy right hadn some arthritis changes   Esp at thumb  NEURO:  Oriented x3, cranial nerves 3-12 appear to be intact, no obvious focal weakness,gait within normal limits SKIN: No acute rashes normal turgor, color, no bruising or petechiae. PSYCH: Oriented, good eye contact, no obvious depression anxiety, cognition and judgment appear normal. LN: no cervical axillary inguinal adenopathy Oriented x 3 and no noted deficits in memory, attention, and speech.  Lab Results  Component Value Date   WBC 7.7 04/11/2011   HGB 12.4 04/11/2011   HCT 38.0 04/11/2011   PLT 288.0 04/11/2011   GLUCOSE 76 04/11/2011   CHOL 188 04/11/2011   TRIG 112.0 04/11/2011   HDL 63.10 04/11/2011   LDLCALC 103* 04/11/2011   ALT 17 04/11/2011   AST 15 04/11/2011   NA 141 04/11/2011   K 3.2* 04/11/2011   CL 102 04/11/2011   CREATININE 0.8 04/11/2011   BUN 8 04/11/2011   CO2 30 04/11/2011   TSH 0.24* 04/11/2011          Assessment & Plan:  Preventive Health Care Counseled regarding healthy nutrition, exercise, sleep, injury prevention, calcium vit d and healthy weight .  Thyroid  Low TSH no sx  Needs fu with labs  R/o subclinical hyperthyroid Potassium low oout of supp  Restart and then recheck labs  HT stable  Breast cancer  Post op right stable Hx of anemia neg gi bleeding had full work up ok today continue GI protection  Ok to stop iron if wishes  Allergy asthma stable LIPIDS  No se of meds noted

## 2011-04-24 NOTE — Progress Notes (Signed)
Quick Note:  Pt unsure the last time she took last dose ______

## 2011-04-24 NOTE — Patient Instructions (Signed)
Restart the potassium pill once a day. Plan blood tests in about 5 weeks (non fasting ok)  Will check potassium and thyroid tests.  Fu depending on results or 6 months.

## 2011-04-28 DIAGNOSIS — Z0000 Encounter for general adult medical examination without abnormal findings: Secondary | ICD-10-CM | POA: Insufficient documentation

## 2011-05-01 DIAGNOSIS — J309 Allergic rhinitis, unspecified: Secondary | ICD-10-CM | POA: Diagnosis not present

## 2011-05-08 DIAGNOSIS — J309 Allergic rhinitis, unspecified: Secondary | ICD-10-CM | POA: Diagnosis not present

## 2011-05-15 DIAGNOSIS — J309 Allergic rhinitis, unspecified: Secondary | ICD-10-CM | POA: Diagnosis not present

## 2011-05-29 ENCOUNTER — Other Ambulatory Visit (INDEPENDENT_AMBULATORY_CARE_PROVIDER_SITE_OTHER): Payer: BC Managed Care – PPO

## 2011-05-29 DIAGNOSIS — C50919 Malignant neoplasm of unspecified site of unspecified female breast: Secondary | ICD-10-CM

## 2011-05-29 DIAGNOSIS — E785 Hyperlipidemia, unspecified: Secondary | ICD-10-CM

## 2011-05-29 DIAGNOSIS — J45909 Unspecified asthma, uncomplicated: Secondary | ICD-10-CM

## 2011-05-29 DIAGNOSIS — D509 Iron deficiency anemia, unspecified: Secondary | ICD-10-CM

## 2011-05-29 DIAGNOSIS — J309 Allergic rhinitis, unspecified: Secondary | ICD-10-CM | POA: Diagnosis not present

## 2011-05-29 DIAGNOSIS — R6889 Other general symptoms and signs: Secondary | ICD-10-CM

## 2011-05-29 DIAGNOSIS — M1990 Unspecified osteoarthritis, unspecified site: Secondary | ICD-10-CM

## 2011-05-29 DIAGNOSIS — I10 Essential (primary) hypertension: Secondary | ICD-10-CM | POA: Diagnosis not present

## 2011-05-29 DIAGNOSIS — R7989 Other specified abnormal findings of blood chemistry: Secondary | ICD-10-CM

## 2011-05-29 DIAGNOSIS — K219 Gastro-esophageal reflux disease without esophagitis: Secondary | ICD-10-CM

## 2011-05-29 DIAGNOSIS — E876 Hypokalemia: Secondary | ICD-10-CM

## 2011-05-29 LAB — BASIC METABOLIC PANEL
BUN: 11 mg/dL (ref 6–23)
CO2: 27 mEq/L (ref 19–32)
Calcium: 9 mg/dL (ref 8.4–10.5)
Chloride: 103 mEq/L (ref 96–112)
Creatinine, Ser: 0.7 mg/dL (ref 0.4–1.2)
GFR: 98.91 mL/min (ref >60.00)
Glucose, Bld: 83 mg/dL (ref 70–99)
Potassium: 3.3 mEq/L — ABNORMAL LOW (ref 3.5–5.1)
Sodium: 139 mEq/L (ref 135–145)

## 2011-05-29 LAB — TSH: TSH: 0.29 u[IU]/mL — ABNORMAL LOW (ref 0.35–5.50)

## 2011-05-29 LAB — T4, FREE: Free T4: 0.75 ng/dL (ref 0.60–1.60)

## 2011-05-29 LAB — T3, FREE: T3, Free: 2.9 pg/mL (ref 2.3–4.2)

## 2011-05-29 LAB — MAGNESIUM: Magnesium: 2 mg/dL (ref 1.5–2.5)

## 2011-06-05 DIAGNOSIS — J309 Allergic rhinitis, unspecified: Secondary | ICD-10-CM | POA: Diagnosis not present

## 2011-06-07 ENCOUNTER — Telehealth: Payer: Self-pay | Admitting: Internal Medicine

## 2011-06-07 NOTE — Telephone Encounter (Signed)
Pt requesting refill on solifenacin (VESICARE) 5 MG tablet   [facility]  Pt also requesting results of labs

## 2011-06-07 NOTE — Telephone Encounter (Signed)
Left a message for pt to return call 

## 2011-06-07 NOTE — Progress Notes (Signed)
Quick Note:  Left a message for pt to return call. ______ 

## 2011-06-14 ENCOUNTER — Other Ambulatory Visit: Payer: Self-pay | Admitting: Internal Medicine

## 2011-06-14 DIAGNOSIS — E876 Hypokalemia: Secondary | ICD-10-CM

## 2011-06-14 DIAGNOSIS — R7989 Other specified abnormal findings of blood chemistry: Secondary | ICD-10-CM

## 2011-06-14 DIAGNOSIS — J309 Allergic rhinitis, unspecified: Secondary | ICD-10-CM | POA: Diagnosis not present

## 2011-06-14 MED ORDER — SOLIFENACIN SUCCINATE 5 MG PO TABS: 5.0000 mg | ORAL_TABLET | Freq: Every day | ORAL | Status: DC

## 2011-06-14 NOTE — Telephone Encounter (Signed)
Pt walked in to office on 06/14/11 after leaving [facility].  Pt is aware of lab results and states she will make an appt to have bmp repeated.  Pt would like potassium called in to [facility] as well. Rx for potassium sent on 04/24/11 to [facility] and Vesicare sent on 06/14/11.  Pt aware.

## 2011-06-20 ENCOUNTER — Telehealth: Payer: Self-pay | Admitting: Internal Medicine

## 2011-06-20 MED ORDER — SOLIFENACIN SUCCINATE 5 MG PO TABS: 5.0000 mg | ORAL_TABLET | Freq: Every day | ORAL | Status: DC

## 2011-06-20 NOTE — Telephone Encounter (Signed)
Rx sent to [facility] until mail order arrives.

## 2011-06-20 NOTE — Telephone Encounter (Signed)
Pts mail order for solifenacin (VESICARE) 5 MG tablet has not arrived. Pls call in to [facility] on [address] set to last until the mail order arrives.

## 2011-06-26 DIAGNOSIS — J309 Allergic rhinitis, unspecified: Secondary | ICD-10-CM | POA: Diagnosis not present

## 2011-07-03 DIAGNOSIS — J309 Allergic rhinitis, unspecified: Secondary | ICD-10-CM | POA: Diagnosis not present

## 2011-07-10 NOTE — Progress Notes (Signed)
Quick Note:  Pt given results in April 2013. ______

## 2011-07-12 ENCOUNTER — Other Ambulatory Visit (INDEPENDENT_AMBULATORY_CARE_PROVIDER_SITE_OTHER): Payer: BC Managed Care – PPO

## 2011-07-12 DIAGNOSIS — E876 Hypokalemia: Secondary | ICD-10-CM | POA: Diagnosis not present

## 2011-07-12 DIAGNOSIS — J309 Allergic rhinitis, unspecified: Secondary | ICD-10-CM | POA: Diagnosis not present

## 2011-07-12 LAB — BASIC METABOLIC PANEL
BUN: 13 mg/dL (ref 6–23)
CO2: 28 mEq/L (ref 19–32)
Calcium: 8.8 mg/dL (ref 8.4–10.5)
Chloride: 104 mEq/L (ref 96–112)
Creatinine, Ser: 0.7 mg/dL (ref 0.4–1.2)
GFR: 97.34 mL/min (ref >60.00)
Glucose, Bld: 86 mg/dL (ref 70–99)
Potassium: 3.6 mEq/L (ref 3.5–5.1)
Sodium: 142 mEq/L (ref 135–145)

## 2011-07-16 DIAGNOSIS — J309 Allergic rhinitis, unspecified: Secondary | ICD-10-CM | POA: Diagnosis not present

## 2011-07-18 DIAGNOSIS — J309 Allergic rhinitis, unspecified: Secondary | ICD-10-CM | POA: Diagnosis not present

## 2011-07-20 ENCOUNTER — Telehealth: Payer: Self-pay

## 2011-07-20 NOTE — Telephone Encounter (Signed)
Pls advise on labs.  

## 2011-07-20 NOTE — Telephone Encounter (Signed)
Patient called requesting lab results  Please advise

## 2011-07-20 NOTE — Telephone Encounter (Signed)
Tell pt that her potassium is now in  the normal range.3.6   Continue increase dose of potassium   Refill if needed

## 2011-07-23 DIAGNOSIS — J309 Allergic rhinitis, unspecified: Secondary | ICD-10-CM | POA: Diagnosis not present

## 2011-07-25 ENCOUNTER — Telehealth: Payer: Self-pay

## 2011-07-25 NOTE — Telephone Encounter (Signed)
Pt called requesting to have results from recent lab work from 07/12/2011 and pt states she would like to know why she has a consult with Dr. Talmage Nap.  Advising pt of results from 06/2011 and pt received a call on her cell phone and pt states " I have an important call coming through thank you" and hung the phone up.

## 2011-07-31 ENCOUNTER — Other Ambulatory Visit (HOSPITAL_COMMUNITY): Payer: Self-pay | Admitting: Endocrinology

## 2011-07-31 DIAGNOSIS — E0590 Thyrotoxicosis, unspecified without thyrotoxic crisis or storm: Secondary | ICD-10-CM

## 2011-07-31 DIAGNOSIS — J309 Allergic rhinitis, unspecified: Secondary | ICD-10-CM | POA: Diagnosis not present

## 2011-08-09 ENCOUNTER — Encounter (HOSPITAL_COMMUNITY)
Admission: RE | Admit: 2011-08-09 | Discharge: 2011-08-09 | Disposition: A | Discharge status: Home / Self Care | Payer: Medicare Other | Source: Ambulatory Visit | Attending: Endocrinology | Admitting: Endocrinology

## 2011-08-09 DIAGNOSIS — J309 Allergic rhinitis, unspecified: Secondary | ICD-10-CM | POA: Diagnosis not present

## 2011-08-09 DIAGNOSIS — E0590 Thyrotoxicosis, unspecified without thyrotoxic crisis or storm: Secondary | ICD-10-CM | POA: Insufficient documentation

## 2011-08-10 ENCOUNTER — Other Ambulatory Visit (HOSPITAL_COMMUNITY): Payer: BC Managed Care – PPO

## 2011-08-10 MED ORDER — SODIUM IODIDE I 131 CAPSULE
15.0000 | Freq: Once | INTRAVENOUS | Status: AC | PRN
  Administered 2011-08-09: 15 via ORAL

## 2011-08-10 MED ORDER — SODIUM PERTECHNETATE TC 99M INJECTION
10.0000 | Freq: Once | INTRAVENOUS | Status: AC | PRN
  Administered 2011-08-10: 10 via INTRAVENOUS

## 2011-08-15 DIAGNOSIS — J309 Allergic rhinitis, unspecified: Secondary | ICD-10-CM | POA: Diagnosis not present

## 2011-08-22 DIAGNOSIS — J309 Allergic rhinitis, unspecified: Secondary | ICD-10-CM | POA: Diagnosis not present

## 2011-08-23 ENCOUNTER — Telehealth: Payer: Self-pay | Admitting: Internal Medicine

## 2011-08-23 NOTE — Telephone Encounter (Signed)
Patient is calling for xray results.

## 2011-08-24 NOTE — Telephone Encounter (Signed)
Spoke with patient and informed her she should contact Dr Willeen Cass office for xray results

## 2011-08-28 DIAGNOSIS — I10 Essential (primary) hypertension: Secondary | ICD-10-CM | POA: Diagnosis not present

## 2011-08-28 DIAGNOSIS — Z0000 Encounter for general adult medical examination without abnormal findings: Secondary | ICD-10-CM | POA: Diagnosis not present

## 2011-08-28 DIAGNOSIS — E0590 Thyrotoxicosis, unspecified without thyrotoxic crisis or storm: Secondary | ICD-10-CM | POA: Diagnosis not present

## 2011-08-28 DIAGNOSIS — R609 Edema, unspecified: Secondary | ICD-10-CM | POA: Diagnosis not present

## 2011-08-29 DIAGNOSIS — J309 Allergic rhinitis, unspecified: Secondary | ICD-10-CM | POA: Diagnosis not present

## 2011-08-30 DIAGNOSIS — E2839 Other primary ovarian failure: Secondary | ICD-10-CM | POA: Diagnosis not present

## 2011-09-05 DIAGNOSIS — J309 Allergic rhinitis, unspecified: Secondary | ICD-10-CM | POA: Diagnosis not present

## 2011-09-11 ENCOUNTER — Telehealth: Payer: Self-pay | Admitting: Internal Medicine

## 2011-09-11 NOTE — Telephone Encounter (Signed)
Caller: Nikeshia/Patient; PCP: Madelin Headings.; CB#: [phone]; ; ; Call regarding Asking If Medical Records Have Been Rec D;  Pt calling stating she called twice yesterday to see if records have been rec'd from endocrinologist office-not as of yesterday; checking for today. Trx to Omar.

## 2011-09-14 ENCOUNTER — Ambulatory Visit (INDEPENDENT_AMBULATORY_CARE_PROVIDER_SITE_OTHER): Payer: Medicare Other | Admitting: Internal Medicine

## 2011-09-14 ENCOUNTER — Encounter: Payer: Self-pay | Admitting: Internal Medicine

## 2011-09-14 VITALS — BP 138/84 | HR 82 | Temp 98.2°F | Wt 165.0 lb

## 2011-09-14 DIAGNOSIS — J45909 Unspecified asthma, uncomplicated: Secondary | ICD-10-CM | POA: Diagnosis not present

## 2011-09-14 DIAGNOSIS — E0590 Thyrotoxicosis, unspecified without thyrotoxic crisis or storm: Secondary | ICD-10-CM

## 2011-09-14 DIAGNOSIS — J309 Allergic rhinitis, unspecified: Secondary | ICD-10-CM | POA: Diagnosis not present

## 2011-09-14 DIAGNOSIS — R609 Edema, unspecified: Secondary | ICD-10-CM

## 2011-09-14 DIAGNOSIS — I10 Essential (primary) hypertension: Secondary | ICD-10-CM | POA: Diagnosis not present

## 2011-09-14 NOTE — Patient Instructions (Addendum)
Check  bp readings  About 3 x per week to make sure controlled blood pressure.  Hyperthyroid   Can make you feel tired and heart racing.   The RAI is a standard treatment for the hyperthyroid state but can take a while to regulate afterward .  Consider second opinion if needed

## 2011-09-14 NOTE — Progress Notes (Signed)
Subjective:    Patient ID: [name], female    DOB: 1930-06-09, 78 y.o.   MRN: [id number]  HPI Patient comes in today for follow up of  multiple medical problems.  Since her last visit had been evaluated but Dr Talmage Nap about hyper thyroid rx fu.  Has ?s and hesitant to do the I 131 rx  .unsure what she wants or feels comfortable with.   Has /  Feels well but does get palpitations at times .  Asthma stable no flare HT had been ok  LIPIDS no change Had dexa scan per Dr B and was normal  6 13   Past history family history social history reviewed in the electronic medical record. ROS no cp sob continued edema some palpitations no weigh loss no cough currently  Review of Systems Outpatient Encounter Prescriptions as of 09/14/2011  Medication Sig Dispense Refill  . amLODipine (NORVASC) 5 MG tablet Take 1 tablet (5 mg total) by mouth daily.  90 tablet  3  . budesonide-formoterol (SYMBICORT) 160-4.5 MCG/ACT inhaler Inhale 2 puffs into the lungs as needed.        . ergocalciferol (VITAMIN D2) 50000 UNITS capsule Take 50,000 Units by mouth once a week.        . Ferrous Sulfate Dried (FEOSOL) 200 (65 FE) MG TABS Take 200 mg by mouth daily.        Marland Kitchen lisinopril-hydrochlorothiazide (PRINZIDE,ZESTORETIC) 20-12.5 MG per tablet Take 1 tablet by mouth daily.  90 tablet  3  . montelukast (SINGULAIR) 10 MG tablet Take 10 mg by mouth at bedtime.        Marland Kitchen olopatadine (PATANOL) 0.1 % ophthalmic solution Place 1 drop into both eyes as needed.        Marland Kitchen omeprazole (PRILOSEC) 20 MG capsule Take 1 capsule (20 mg total) by mouth daily.  90 capsule  3  . potassium chloride SA (K-DUR,KLOR-CON) 20 MEQ tablet Take 1 tablet (20 mEq total) by mouth daily.  90 tablet  3  . simvastatin (ZOCOR) 20 MG tablet Take 20 mg by mouth at bedtime.        . solifenacin (VESICARE) 5 MG tablet Take 1 tablet (5 mg total) by mouth daily.  30 tablet  0       Objective:   Physical Exam BP 160/80  Pulse 82  Temp 98.2 F (36.8 C)  (Oral)  Wt 165 lb (74.844 kg)  SpO2 96% Repeat BP right sitting  148/86 and 138/84 left  Large cuff. WDWN in nad Oriented x 3 and no noted deficits in memory, attention, and speech. reviewed data  Cor rr no g . Legs 2 + edema no change Chest  Clear to a  Lab Results  Component Value Date   TSH 0.29* 05/29/2011   TSH  .24  7/8 per dr Talmage Nap Has scan 6 13 nl uptake but airreg right thyroid gland  Poss mng  ehr reviewed     Assessment & Plan:  Subclinical hyperthyroid Consult advise i 131  Pt has more ?s  Disc  Dx and rx and gave HO from [facility] and some academic web sites to check out and then to ask other ?s to Dr Gilman Schmidt office.  Disc that hyperthyroid at her age could be a cv risk esp if gets worse. HT up on first test but better with sitting and recheck to monitor and fu if elevated  Asthma stable at this time.  Hx of breast cancer stable  Total visit  > 50% spent counseling and coordinating care

## 2011-09-15 ENCOUNTER — Encounter: Payer: Self-pay | Admitting: Internal Medicine

## 2011-09-15 DIAGNOSIS — E0590 Thyrotoxicosis, unspecified without thyrotoxic crisis or storm: Secondary | ICD-10-CM | POA: Insufficient documentation

## 2011-09-27 DIAGNOSIS — J309 Allergic rhinitis, unspecified: Secondary | ICD-10-CM | POA: Diagnosis not present

## 2011-10-04 DIAGNOSIS — J309 Allergic rhinitis, unspecified: Secondary | ICD-10-CM | POA: Diagnosis not present

## 2011-10-09 DIAGNOSIS — J309 Allergic rhinitis, unspecified: Secondary | ICD-10-CM | POA: Diagnosis not present

## 2011-10-17 ENCOUNTER — Telehealth: Payer: Self-pay | Admitting: Internal Medicine

## 2011-10-17 DIAGNOSIS — J309 Allergic rhinitis, unspecified: Secondary | ICD-10-CM | POA: Diagnosis not present

## 2011-10-17 NOTE — Telephone Encounter (Addendum)
Pt needs refill on vitamin d #12 with 3 [facility]

## 2011-10-17 NOTE — Telephone Encounter (Signed)
Pt needs refill on vitamin d #12 with 3 [facility] °

## 2011-10-18 ENCOUNTER — Other Ambulatory Visit: Payer: Self-pay | Admitting: Family Medicine

## 2011-10-18 ENCOUNTER — Encounter: Payer: Self-pay | Admitting: Internal Medicine

## 2011-10-18 NOTE — Telephone Encounter (Signed)
Spoke to the pt.  Found the labs that gave the instructions for the Vit D.  Labs were done 09/26/10.  Pt was to complete a 50000 unit course and then start 1000 units daily.  Informed the pt of all.  She will go to local pharmacy to get Vit D OTC.

## 2011-10-24 DIAGNOSIS — J309 Allergic rhinitis, unspecified: Secondary | ICD-10-CM | POA: Diagnosis not present

## 2011-10-31 DIAGNOSIS — J309 Allergic rhinitis, unspecified: Secondary | ICD-10-CM | POA: Diagnosis not present

## 2011-11-07 DIAGNOSIS — J309 Allergic rhinitis, unspecified: Secondary | ICD-10-CM | POA: Diagnosis not present

## 2011-11-12 DIAGNOSIS — J309 Allergic rhinitis, unspecified: Secondary | ICD-10-CM | POA: Diagnosis not present

## 2011-11-16 ENCOUNTER — Telehealth: Payer: Self-pay | Admitting: Internal Medicine

## 2011-11-16 ENCOUNTER — Ambulatory Visit (INDEPENDENT_AMBULATORY_CARE_PROVIDER_SITE_OTHER): Payer: Medicare Other | Admitting: Family Medicine

## 2011-11-16 DIAGNOSIS — Z23 Encounter for immunization: Secondary | ICD-10-CM

## 2011-11-16 DIAGNOSIS — J309 Allergic rhinitis, unspecified: Secondary | ICD-10-CM | POA: Diagnosis not present

## 2011-11-16 NOTE — Telephone Encounter (Signed)
Patient came in stating that the MD is referring her to a second endocrinologist and she would like her records from the first endocrinologist faxed over to the second one. Please assist.

## 2011-11-16 NOTE — Telephone Encounter (Signed)
Pt was given flu shot in the office today.  She was notified that she will have to call the first endocrinologist office to have chart faxed to new office.  May need to sign a release of records.  She will do this.

## 2011-11-26 ENCOUNTER — Telehealth: Payer: Self-pay | Admitting: Internal Medicine

## 2011-11-26 DIAGNOSIS — J309 Allergic rhinitis, unspecified: Secondary | ICD-10-CM | POA: Diagnosis not present

## 2011-11-26 NOTE — Telephone Encounter (Signed)
Patient came in to check on her referral to an endocrinologist for a second opinion. This new referral has not been done yet. Please assist/advise.

## 2011-11-27 NOTE — Telephone Encounter (Signed)
Can you tell me?

## 2011-11-28 ENCOUNTER — Other Ambulatory Visit: Payer: Self-pay | Admitting: Family Medicine

## 2011-11-28 DIAGNOSIS — R7989 Other specified abnormal findings of blood chemistry: Secondary | ICD-10-CM

## 2011-11-28 DIAGNOSIS — E0590 Thyrotoxicosis, unspecified without thyrotoxic crisis or storm: Secondary | ICD-10-CM

## 2011-11-28 NOTE — Telephone Encounter (Signed)
The only referral I show for her for endocrinology was made back in April with Dr. Talmage Nap and Camelia Eng Made the pt aware of the appt. Pt she is looking for a second opinion from this appt a new referral will needs to be entered

## 2011-11-28 NOTE — Telephone Encounter (Signed)
Referral placed in the system. 

## 2011-12-04 DIAGNOSIS — J309 Allergic rhinitis, unspecified: Secondary | ICD-10-CM | POA: Diagnosis not present

## 2011-12-10 DIAGNOSIS — E0590 Thyrotoxicosis, unspecified without thyrotoxic crisis or storm: Secondary | ICD-10-CM | POA: Diagnosis not present

## 2011-12-11 DIAGNOSIS — J309 Allergic rhinitis, unspecified: Secondary | ICD-10-CM | POA: Diagnosis not present

## 2011-12-21 DIAGNOSIS — J309 Allergic rhinitis, unspecified: Secondary | ICD-10-CM | POA: Diagnosis not present

## 2011-12-21 DIAGNOSIS — H2510 Age-related nuclear cataract, unspecified eye: Secondary | ICD-10-CM | POA: Diagnosis not present

## 2011-12-26 DIAGNOSIS — J309 Allergic rhinitis, unspecified: Secondary | ICD-10-CM | POA: Diagnosis not present

## 2012-01-03 DIAGNOSIS — J309 Allergic rhinitis, unspecified: Secondary | ICD-10-CM | POA: Diagnosis not present

## 2012-01-09 DIAGNOSIS — J309 Allergic rhinitis, unspecified: Secondary | ICD-10-CM | POA: Diagnosis not present

## 2012-01-17 DIAGNOSIS — J309 Allergic rhinitis, unspecified: Secondary | ICD-10-CM | POA: Diagnosis not present

## 2012-01-18 DIAGNOSIS — J309 Allergic rhinitis, unspecified: Secondary | ICD-10-CM | POA: Diagnosis not present

## 2012-01-23 DIAGNOSIS — J309 Allergic rhinitis, unspecified: Secondary | ICD-10-CM | POA: Diagnosis not present

## 2012-01-24 ENCOUNTER — Ambulatory Visit (INDEPENDENT_AMBULATORY_CARE_PROVIDER_SITE_OTHER): Payer: Medicare Other | Admitting: Cardiovascular Disease

## 2012-01-24 ENCOUNTER — Encounter: Payer: Self-pay | Admitting: Cardiovascular Disease

## 2012-01-24 VITALS — BP 144/80 | HR 67 | Ht 60.5 in | Wt 162.1 lb

## 2012-01-24 DIAGNOSIS — R0789 Other chest pain: Secondary | ICD-10-CM

## 2012-01-24 DIAGNOSIS — R0609 Other forms of dyspnea: Secondary | ICD-10-CM

## 2012-01-24 DIAGNOSIS — R0989 Other specified symptoms and signs involving the circulatory and respiratory systems: Secondary | ICD-10-CM

## 2012-01-24 DIAGNOSIS — R0600 Dyspnea, unspecified: Secondary | ICD-10-CM

## 2012-01-24 MED ORDER — LISINOPRIL-HYDROCHLOROTHIAZIDE 20-12.5 MG PO TABS: 2.0000 | ORAL_TABLET | Freq: Every day | ORAL | Status: DC

## 2012-01-24 NOTE — Patient Instructions (Addendum)
1)   Your physician has requested that you have an echocardiogram. Echocardiography is a painless test that uses sound waves to create images of your heart. It provides your doctor with information about the size and shape of your heart and how well your heart's chambers and valves are working. This procedure takes approximately one hour. There are no restrictions for this procedure.  2)  Your physician has requested that you have a lexiscan myoview.  Please follow instruction sheet, as given.   3)  Your physician recommends that you return for lab work in: 1 month need app for bmet    4)  Your physician recommends that you schedule a follow-up appointment in: 3 months/ Dr Elease Hashimoto  5) Your physician has recommended you make the following change in your medication:   Increase lisinopril/ hctz to 40 mg/ 25 mg daily,

## 2012-01-24 NOTE — Assessment & Plan Note (Signed)
Mrs. [name] presents with episodes of chest tightness. She's actually had these episodes for the past 5 years. She had an echocardiogram in 2010 that was normal. She recalls having a stress test in the past.  The chest tightness and dyspnea on exertion have worsened to the point where she can't even get out of the car and walk into a store without having severe shortness of breath.  We will schedule her for a Lexus scan Myoview study to rule out coronary artery disease. I would also like to repeat her echocardiogram she's having some dyspnea on exertion as well as chest tightness.  We talked about starting a regular exercise program.  It's also possible that her shortness of breath with exertion is due to her elevated blood pressure. Her blood pressure likely goes up with exertion. We will increase her lisinopril /  HCTZ up to 40 mg/25 mg a day. We'll check her basic metabolic profile in one month. I'll see her in 3 months for followup office visit and basic metabolic profile.

## 2012-01-24 NOTE — Progress Notes (Signed)
[name] Date of Birth  April 09, 1930       [facility] Office [address], [address]  [address], [address] [place], [place]  [address]   [place], [place]  [address] [phone]     [phone]   Fax  [phone]    Fax [phone]  Problem List: 1. Dyspnea 2. Hypertension   History of Present Illness:  [name] is a 78 yo - previous patient of Dr. Daleen Squibb.  She presents with exertional chest tightness and dyspnea.  She is not able to exercise much because of that .  She last saw Dr. Daleen Squibb in 2009.    She had a normal echocardiogram in 2010.  She has never exercised much but is trying to do more. She thinks that she did a treadmill many years ago.  These episodes of chest tightness occurs primarily with exertion. It last as long she is walking. The discomfort tends to resolve fairly quickly when she stops walking.  She has been trying to avoid eating salty foods.    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   Current Outpatient Prescriptions on File Prior to Visit  Medication Sig Dispense Refill  . amLODipine (NORVASC) 5 MG tablet Take 1 tablet (5 mg total) by mouth daily.  90 tablet  3  . budesonide-formoterol (SYMBICORT) 160-4.5 MCG/ACT inhaler Inhale 2 puffs into the lungs as needed.        . cholecalciferol (VITAMIN D) 1000 UNITS tablet Take 1,000 Units by mouth daily.      . Ferrous Sulfate Dried (FEOSOL) 200 (65 FE) MG TABS Take 200 mg by mouth daily.        Marland Kitchen lisinopril-hydrochlorothiazide (PRINZIDE,ZESTORETIC) 20-12.5 MG per tablet Take 1 tablet by mouth daily.  90 tablet  3  . montelukast (SINGULAIR) 10 MG tablet Take 10 mg by mouth at bedtime.        Marland Kitchen olopatadine (PATANOL) 0.1 % ophthalmic solution Place 1 drop into both eyes as needed.        Marland Kitchen omeprazole (PRILOSEC) 20 MG capsule Take 1 capsule (20 mg total) by mouth daily.  90 capsule  3  . potassium chloride SA (K-DUR,KLOR-CON) 20 MEQ tablet Take 1 tablet (20 mEq  total) by mouth daily.  90 tablet  3  . simvastatin (ZOCOR) 20 MG tablet Take 20 mg by mouth at bedtime.        . solifenacin (VESICARE) 5 MG tablet Take 1 tablet (5 mg total) by mouth daily.  30 tablet  0    No Known Allergies  Past Medical History  Diagnosis Date  . Allergy   . Asthma   . GERD (gastroesophageal reflux disease)   . Hyperlipidemia   . Hypertension   . Arthritis   . Anemia   . Cancer     rt breast    Past Surgical History  Procedure Date  . Partial hip arthroplasty     rt  . Laryngectomy   . Breast lumpectomy 2012    right    History  Smoking status  . Former Smoker  Smokeless tobacco  . Not on file    History  Alcohol Use No    Family History  Problem Relation Age of Onset  . Cancer Mother     breast and ovarian    Reviw of Systems:  Reviewed in the HPI.  All other systems are negative.  Physical Exam:  Blood pressure 144/80, pulse 67, height 5' 0.5" (1.537 m), weight 162 lb 1.9 oz (73.537 kg). General: Well developed, well nourished, in no acute distress.  Head: Normocephalic, atraumatic, sclera non-icteric, mucus membranes are moist,   Neck: Supple. Carotids are 2 + without bruits. No JVD  Lungs: Clear bilaterally to auscultation.  Heart: regular rate.  normal  S1 S2. No murmurs, gallops or rubs.  Abdomen: Soft, non-tender, non-distended with normal bowel sounds. No hepatomegaly. No rebound/guarding. No masses.  Msk:  Strength and tone are normal  Extremities: No clubbing or cyanosis. No edema.  Distal pedal pulses are 2+ and equal bilaterally.  Neuro: Alert and oriented X 3. Moves all extremities spontaneously.  Psych:  Responds to questions appropriately with a normal affect.  ECG: 01/24/2012-normal sinus rhythm at 67. There is marked sinus arrhythmia. She has nonspecific ST-T wave abnormality.  Assessment / Plan:

## 2012-02-05 ENCOUNTER — Ambulatory Visit (HOSPITAL_COMMUNITY): Payer: Medicare Other | Attending: Cardiovascular Disease

## 2012-02-05 DIAGNOSIS — C50919 Malignant neoplasm of unspecified site of unspecified female breast: Secondary | ICD-10-CM | POA: Diagnosis not present

## 2012-02-05 DIAGNOSIS — J309 Allergic rhinitis, unspecified: Secondary | ICD-10-CM | POA: Diagnosis not present

## 2012-02-05 DIAGNOSIS — K219 Gastro-esophageal reflux disease without esophagitis: Secondary | ICD-10-CM | POA: Diagnosis not present

## 2012-02-05 DIAGNOSIS — Z87891 Personal history of nicotine dependence: Secondary | ICD-10-CM | POA: Insufficient documentation

## 2012-02-05 DIAGNOSIS — E785 Hyperlipidemia, unspecified: Secondary | ICD-10-CM | POA: Diagnosis not present

## 2012-02-05 DIAGNOSIS — I10 Essential (primary) hypertension: Secondary | ICD-10-CM | POA: Diagnosis not present

## 2012-02-05 DIAGNOSIS — R0789 Other chest pain: Secondary | ICD-10-CM

## 2012-02-05 DIAGNOSIS — R0609 Other forms of dyspnea: Secondary | ICD-10-CM | POA: Diagnosis not present

## 2012-02-05 DIAGNOSIS — R0989 Other specified symptoms and signs involving the circulatory and respiratory systems: Secondary | ICD-10-CM | POA: Diagnosis not present

## 2012-02-05 DIAGNOSIS — J45909 Unspecified asthma, uncomplicated: Secondary | ICD-10-CM | POA: Insufficient documentation

## 2012-02-05 DIAGNOSIS — R072 Precordial pain: Secondary | ICD-10-CM | POA: Insufficient documentation

## 2012-02-05 DIAGNOSIS — I079 Rheumatic tricuspid valve disease, unspecified: Secondary | ICD-10-CM | POA: Insufficient documentation

## 2012-02-05 DIAGNOSIS — R0600 Dyspnea, unspecified: Secondary | ICD-10-CM

## 2012-02-05 NOTE — Progress Notes (Signed)
Echocardiogram performed.  

## 2012-02-07 ENCOUNTER — Ambulatory Visit (HOSPITAL_COMMUNITY): Payer: Medicare Other | Attending: Cardiology | Admitting: Radiology

## 2012-02-07 VITALS — BP 126/72 | Ht 60.5 in | Wt 159.0 lb

## 2012-02-07 DIAGNOSIS — R0602 Shortness of breath: Secondary | ICD-10-CM | POA: Diagnosis not present

## 2012-02-07 DIAGNOSIS — I10 Essential (primary) hypertension: Secondary | ICD-10-CM | POA: Insufficient documentation

## 2012-02-07 DIAGNOSIS — E785 Hyperlipidemia, unspecified: Secondary | ICD-10-CM | POA: Diagnosis not present

## 2012-02-07 DIAGNOSIS — Z853 Personal history of malignant neoplasm of breast: Secondary | ICD-10-CM | POA: Insufficient documentation

## 2012-02-07 DIAGNOSIS — J4489 Other specified chronic obstructive pulmonary disease: Secondary | ICD-10-CM | POA: Insufficient documentation

## 2012-02-07 DIAGNOSIS — R079 Chest pain, unspecified: Secondary | ICD-10-CM | POA: Diagnosis not present

## 2012-02-07 DIAGNOSIS — R5381 Other malaise: Secondary | ICD-10-CM | POA: Diagnosis not present

## 2012-02-07 DIAGNOSIS — R0609 Other forms of dyspnea: Secondary | ICD-10-CM | POA: Insufficient documentation

## 2012-02-07 DIAGNOSIS — R0989 Other specified symptoms and signs involving the circulatory and respiratory systems: Secondary | ICD-10-CM | POA: Diagnosis not present

## 2012-02-07 DIAGNOSIS — R0789 Other chest pain: Secondary | ICD-10-CM | POA: Insufficient documentation

## 2012-02-07 DIAGNOSIS — J449 Chronic obstructive pulmonary disease, unspecified: Secondary | ICD-10-CM | POA: Diagnosis not present

## 2012-02-07 DIAGNOSIS — R5383 Other fatigue: Secondary | ICD-10-CM | POA: Insufficient documentation

## 2012-02-07 DIAGNOSIS — Z87891 Personal history of nicotine dependence: Secondary | ICD-10-CM | POA: Diagnosis not present

## 2012-02-07 DIAGNOSIS — R0600 Dyspnea, unspecified: Secondary | ICD-10-CM

## 2012-02-07 MED ORDER — TECHNETIUM TC 99M SESTAMIBI GENERIC - CARDIOLITE
33.0000 | Freq: Once | INTRAVENOUS | Status: AC | PRN
  Administered 2012-02-07: 33 via INTRAVENOUS

## 2012-02-07 MED ORDER — TECHNETIUM TC 99M SESTAMIBI GENERIC - CARDIOLITE
11.0000 | Freq: Once | INTRAVENOUS | Status: AC | PRN
  Administered 2012-02-07: 11 via INTRAVENOUS

## 2012-02-07 MED ORDER — REGADENOSON 0.4 MG/5ML IV SOLN
0.4000 mg | Freq: Once | INTRAVENOUS | Status: AC
  Administered 2012-02-07: 0.4 mg via INTRAVENOUS

## 2012-02-07 NOTE — Progress Notes (Signed)
[facility] SITE 3 NUCLEAR MED [address] [id number] [place] [address] [phone]  Cardiology Nuclear Med Study  [name] is a 78 y.o. female     MRN : [id number]     DOB: Jul 17, 1930  Procedure Date: 02/07/2012  Nuclear Med Background Indication for Stress Test:  Evaluation for Ischemia History:  Asthma, COPD and 05/2007 MPS: EF:50% no define ischemia no change from 2005 study, 02/05/12 ECHO: EF: 55-65% H/O (R) Breast CA Cardiac Risk Factors: History of Smoking, Hypertension and Lipids  Symptoms:  Chest Tightness, DOE, Fatigue with Exertion and SOB   Nuclear Pre-Procedure Caffeine/Decaff Intake:  None > 12 hrs NPO After: 7:00pm   Lungs:  clear O2 Sat: 95% on room air. IV 0.9% NS with Angio Cath:  22g  IV Site: L Antecubital x 1, tolerated well IV Started by:  Irean Hong, RN  Chest Size (in):  40 Cup Size: B  Height: 5' 0.5" (1.537 m)  Weight:  159 lb (72.122 kg)  BMI:  Body mass index is 30.54 kg/(m^2). Tech Comments:  Held Prinizide this am    Nuclear Med Study 1 or 2 day study: 1 day  Stress Test Type:  Lexiscan  Reading MD: Olga Millers, MD  Order Authorizing Provider:  Kristeen Miss, MD  Resting Radionuclide: Technetium 44m Sestamibi  Resting Radionuclide Dose: 11.0 mCi   Stress Radionuclide:  Technetium 55m Sestamibi  Stress Radionuclide Dose: 33.0 mCi           Stress Protocol Rest HR: 63 Stress HR: 77  Rest BP: 126/72 Stress BP: 132/70  Exercise Time (min): n/a METS: n/a   Predicted Max HR: 141 bpm % Max HR: 54.61 bpm Rate Pressure Product: 03474   Dose of Adenosine (mg):  n/a Dose of Lexiscan: 0.4 mg  Dose of Atropine (mg): n/a Dose of Dobutamine: n/a mcg/kg/min (at max HR)  Stress Test Technologist: Milana Na, EMT-P  Nuclear Technologist:  Domenic Polite, CNMT     Rest Procedure:  Myocardial perfusion imaging was performed at rest 45 minutes following the intravenous administration of Technetium 38m  Sestamibi. Rest ECG: NSR, nonspecific ST changes.  Stress Procedure:  The patient received IV Lexiscan 0.4 mg over 15-seconds.  Technetium 59m Sestamibi injected at 30-seconds.  There were no significant changes, +sob, and a weird feeling with Lexiscan.  Quantitative spect images were obtained after a 45 minute delay. Stress ECG: No significant ST segment change suggestive of ischemia.  QPS Raw Data Images:  Acquisition technically good; normal left ventricular size. Stress Images:  There is decreased uptake in the anterior wall. Rest Images:  There is decreased uptake in the anterior wall. Subtraction (SDS):  No evidence of ischemia. Transient Ischemic Dilatation (Normal <1.22):  0.89 Lung/Heart Ratio (Normal <0.45):  0.16  Quantitative Gated Spect Images QGS EDV:  68 ml QGS ESV:  30 ml  Impression Exercise Capacity:  Lexiscan with no exercise. BP Response:  Normal blood pressure response. Clinical Symptoms:  There is dyspnea. ECG Impression:  No significant ST segment change suggestive of ischemia. Comparison with Prior Nuclear Study: No images to compare  Overall Impression:  Normal stress nuclear study with a small, mild, fixed distal anterior and apical defect consistent with soft tissue attenuation; no ischemia.  LV Ejection Fraction: 55%.  LV Wall Motion:  NL LV Function; NL Wall Motion   Olga Millers

## 2012-02-13 DIAGNOSIS — J309 Allergic rhinitis, unspecified: Secondary | ICD-10-CM | POA: Diagnosis not present

## 2012-02-19 ENCOUNTER — Other Ambulatory Visit: Payer: Self-pay | Admitting: Internal Medicine

## 2012-02-19 DIAGNOSIS — J309 Allergic rhinitis, unspecified: Secondary | ICD-10-CM | POA: Diagnosis not present

## 2012-02-19 DIAGNOSIS — Z853 Personal history of malignant neoplasm of breast: Secondary | ICD-10-CM

## 2012-02-25 ENCOUNTER — Other Ambulatory Visit (INDEPENDENT_AMBULATORY_CARE_PROVIDER_SITE_OTHER): Payer: Medicare Other

## 2012-02-25 DIAGNOSIS — R0609 Other forms of dyspnea: Secondary | ICD-10-CM

## 2012-02-25 DIAGNOSIS — R0789 Other chest pain: Secondary | ICD-10-CM | POA: Diagnosis not present

## 2012-02-25 DIAGNOSIS — J309 Allergic rhinitis, unspecified: Secondary | ICD-10-CM | POA: Diagnosis not present

## 2012-02-25 DIAGNOSIS — R0600 Dyspnea, unspecified: Secondary | ICD-10-CM

## 2012-02-25 DIAGNOSIS — R0989 Other specified symptoms and signs involving the circulatory and respiratory systems: Secondary | ICD-10-CM | POA: Diagnosis not present

## 2012-02-25 LAB — BASIC METABOLIC PANEL
BUN: 12 mg/dL (ref 6–23)
CO2: 28 mEq/L (ref 19–32)
Calcium: 8.9 mg/dL (ref 8.4–10.5)
Chloride: 100 mEq/L (ref 96–112)
Creatinine, Ser: 0.7 mg/dL (ref 0.4–1.2)
GFR: 103.62 mL/min (ref >60.00)
Glucose, Bld: 93 mg/dL (ref 70–99)
Potassium: 3.4 mEq/L — ABNORMAL LOW (ref 3.5–5.1)
Sodium: 138 mEq/L (ref 135–145)

## 2012-02-29 ENCOUNTER — Encounter: Payer: Self-pay | Admitting: Cardiovascular Disease

## 2012-03-03 ENCOUNTER — Telehealth: Payer: Self-pay | Admitting: *Deleted

## 2012-03-03 DIAGNOSIS — E876 Hypokalemia: Secondary | ICD-10-CM

## 2012-03-03 MED ORDER — POTASSIUM CHLORIDE CRYS ER 20 MEQ PO TBCR: 20.0000 meq | EXTENDED_RELEASE_TABLET | Freq: Two times a day (BID) | ORAL | Status: DC

## 2012-03-03 NOTE — Telephone Encounter (Signed)
Pt states she has not been taking k+ as prescribed and her pcp told her prior to increase to bid, pt agrees to take as prescribed, redraw app given, pt is going out of town so lab date was later than requested.

## 2012-03-03 NOTE — Telephone Encounter (Signed)
Message copied by Antony Odea on Mon Mar 03, 2012 11:26 AM ------      Message from: Vesta Mixer      Created: Thu Feb 28, 2012  8:20 AM       If she is taking potassium as directed (20 meq daily) increase up to 20 meq BID.  Recheck BMP in 1-2 weeks.

## 2012-03-06 DIAGNOSIS — J309 Allergic rhinitis, unspecified: Secondary | ICD-10-CM | POA: Diagnosis not present

## 2012-03-25 ENCOUNTER — Ambulatory Visit (INDEPENDENT_AMBULATORY_CARE_PROVIDER_SITE_OTHER): Payer: Medicare Other | Admitting: *Deleted

## 2012-03-25 DIAGNOSIS — J309 Allergic rhinitis, unspecified: Secondary | ICD-10-CM | POA: Diagnosis not present

## 2012-03-25 DIAGNOSIS — I10 Essential (primary) hypertension: Secondary | ICD-10-CM

## 2012-03-25 DIAGNOSIS — E876 Hypokalemia: Secondary | ICD-10-CM | POA: Diagnosis not present

## 2012-03-25 LAB — BASIC METABOLIC PANEL
BUN: 15 mg/dL (ref 6–23)
CO2: 28 mEq/L (ref 19–32)
Calcium: 8.8 mg/dL (ref 8.4–10.5)
Chloride: 102 mEq/L (ref 96–112)
Creatinine, Ser: 0.7 mg/dL (ref 0.4–1.2)
GFR: 107.12 mL/min (ref >60.00)
Glucose, Bld: 94 mg/dL (ref 70–99)
Potassium: 3.5 mEq/L (ref 3.5–5.1)
Sodium: 137 mEq/L (ref 135–145)

## 2012-03-27 ENCOUNTER — Ambulatory Visit
Admission: RE | Admit: 2012-03-27 | Discharge: 2012-03-27 | Disposition: A | Discharge status: Home / Self Care | Payer: Medicare Other | Source: Ambulatory Visit | Attending: Internal Medicine | Admitting: Internal Medicine

## 2012-03-27 DIAGNOSIS — Z853 Personal history of malignant neoplasm of breast: Secondary | ICD-10-CM

## 2012-03-28 DIAGNOSIS — J301 Allergic rhinitis due to pollen: Secondary | ICD-10-CM | POA: Diagnosis not present

## 2012-03-28 DIAGNOSIS — H1045 Other chronic allergic conjunctivitis: Secondary | ICD-10-CM | POA: Diagnosis not present

## 2012-03-28 DIAGNOSIS — J3089 Other allergic rhinitis: Secondary | ICD-10-CM | POA: Diagnosis not present

## 2012-03-28 DIAGNOSIS — J45909 Unspecified asthma, uncomplicated: Secondary | ICD-10-CM | POA: Diagnosis not present

## 2012-04-01 DIAGNOSIS — J309 Allergic rhinitis, unspecified: Secondary | ICD-10-CM | POA: Diagnosis not present

## 2012-04-16 DIAGNOSIS — J309 Allergic rhinitis, unspecified: Secondary | ICD-10-CM | POA: Diagnosis not present

## 2012-04-24 DIAGNOSIS — J309 Allergic rhinitis, unspecified: Secondary | ICD-10-CM | POA: Diagnosis not present

## 2012-04-28 ENCOUNTER — Ambulatory Visit (INDEPENDENT_AMBULATORY_CARE_PROVIDER_SITE_OTHER): Payer: Medicare Other | Admitting: Cardiovascular Disease

## 2012-04-28 ENCOUNTER — Encounter: Payer: Self-pay | Admitting: Cardiovascular Disease

## 2012-04-28 VITALS — BP 157/78 | HR 66 | Wt 162.8 lb

## 2012-04-28 DIAGNOSIS — R0789 Other chest pain: Secondary | ICD-10-CM | POA: Diagnosis not present

## 2012-04-28 DIAGNOSIS — I10 Essential (primary) hypertension: Secondary | ICD-10-CM | POA: Diagnosis not present

## 2012-04-28 NOTE — Progress Notes (Signed)
[name] Date of Birth  1930/10/20       [facility] Office [address]  [address], [address] [place], [place]  [address]   [place], [place]  [address] [phone]     [phone]   Fax  [phone]    Fax [phone]  Problem List: 1. Dyspnea 2. Hypertension   History of Present Illness:  [name] is a 79 yo - previous patient of Dr. Daleen Squibb.  She presents with exertional chest tightness and dyspnea.  She is not able to exercise much because of that .  She last saw Dr. Daleen Squibb in 2009.    She had a normal echocardiogram in 2010.  She has never exercised much but is trying to do more. She thinks that she did a treadmill many years ago.  These episodes of chest tightness occurs primarily with exertion. It last as long she is walking. The discomfort tends to resolve fairly quickly when she stops walking.  She has been trying to avoid eating salty foods.    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  April 28, 2012  Since I last saw [name] she's had a stress Myoview study which was negative. She is no evidence of ischemia.  Her ejection fraction is 55%.  She had an echocardiogram that revealed normal left ventricular systolic motion. She has mild tricuspid regurgitation.  Her breathing is better. She's not having nearly as much shortness of breath. She's able to get out and walk.  She has seen her allergist since I last saw her and it turns out that she was using her inhaler.  Her BP is still running a bit high.  She is avoiding excess salt.    Current Outpatient Prescriptions on File Prior to Visit  Medication Sig Dispense Refill  . amLODipine (NORVASC) 5 MG tablet Take 1 tablet (5 mg total) by mouth daily.  90 tablet  3  . budesonide-formoterol (SYMBICORT) 160-4.5 MCG/ACT inhaler Inhale 2 puffs into the lungs as needed.        . cholecalciferol (VITAMIN D) 1000 UNITS tablet Take 1,000 Units by mouth  daily.      . Ferrous Sulfate Dried (FEOSOL) 200 (65 FE) MG TABS Take 200 mg by mouth daily.        Marland Kitchen lisinopril-hydrochlorothiazide (PRINZIDE,ZESTORETIC) 20-12.5 MG per tablet Take 2 tablets by mouth daily.  180 tablet  3  . montelukast (SINGULAIR) 10 MG tablet Take 10 mg by mouth at bedtime.        Marland Kitchen olopatadine (PATANOL) 0.1 % ophthalmic solution Place 1 drop into both eyes as needed.        Marland Kitchen omeprazole (PRILOSEC) 20 MG capsule Take 1 capsule (20 mg total) by mouth daily.  90 capsule  3  . potassium chloride SA (K-DUR,KLOR-CON) 20 MEQ tablet Take 1 tablet (20 mEq total) by mouth 2 (two) times daily.  180 tablet  3  . simvastatin (ZOCOR) 20 MG tablet Take 20 mg by mouth at bedtime.        . solifenacin (VESICARE) 5 MG tablet Take 1 tablet (5 mg total) by mouth daily.  30 tablet  0   No current facility-administered medications on file prior to visit.    No Known Allergies  Past Medical History  Diagnosis Date  . Allergy   . Asthma   . GERD (gastroesophageal reflux disease)   . Hyperlipidemia   .  Hypertension   . Arthritis   . Anemia   . Cancer     rt breast    Past Surgical History  Procedure Laterality Date  . Partial hip arthroplasty      rt  . Laryngectomy    . Breast lumpectomy  2012    right    History  Smoking status  . Former Smoker  Smokeless tobacco  . Not on file    History  Alcohol Use No    Family History  Problem Relation Age of Onset  . Cancer Mother     breast and ovarian    Reviw of Systems:  Reviewed in the HPI.  All other systems are negative.  Physical Exam: Blood pressure 157/78, pulse 66, weight 162 lb 12.8 oz (73.846 kg). General: Well developed, well nourished, in no acute distress.  Head: Normocephalic, atraumatic, sclera non-icteric, mucus membranes are moist,   Neck: Supple. Carotids are 2 + without bruits. No JVD  Lungs: Clear bilaterally to auscultation.  Heart: regular rate.  normal  S1 S2. No murmurs  Abdomen: Soft,  non-tender, non-distended with normal bowel sounds. No hepatomegaly. No rebound/guarding. No masses.  Msk:  Strength and tone are normal  Extremities: No clubbing or cyanosis. No edema.  Distal pedal pulses are 2+ and equal bilaterally.  Neuro: Alert and oriented X 3. Moves all extremities spontaneously.  Psych:  normal  ECG:.  Assessment / Plan:

## 2012-04-28 NOTE — Assessment & Plan Note (Signed)
Mrs. Woodside is doing well. Her stress Myoview study was normal. Her echocardiogram is normal. She seems to be doing a lot better after changing her inhalers. I suspect a lot of her symptoms were due to asthma.  At this point we will turn her back over to Dr. Fabian Sharp.

## 2012-04-28 NOTE — Patient Instructions (Addendum)
Your physician recommends that you schedule a follow-up appointment in: As Needed   Your physician recommends that you continue on your current medications as directed. Please refer to the Current Medication list given to you today.     

## 2012-04-28 NOTE — Assessment & Plan Note (Signed)
Her blood pressure remains elevated despite doubling her lisinopril HCTZ.  We could consider changing the ACE inhibitor to an ARB  ( Losartan, Micardis, diovan or Benicar) which may be a more effective antihypertensive.  Other alternatives include a hydralazine. Another alternative would be to increase her amlodipine although she already has occasional episodes of mild ankle edema and the increased dose of amlodipine may worsen that.    Her heart rate is already fairly slow so I do not think that adding a beta blocker would be helpful.  We will have her followup with Dr. Fabian Sharp for this issue.

## 2012-05-02 DIAGNOSIS — J309 Allergic rhinitis, unspecified: Secondary | ICD-10-CM | POA: Diagnosis not present

## 2012-05-12 ENCOUNTER — Other Ambulatory Visit: Payer: Self-pay | Admitting: Family Medicine

## 2012-05-12 ENCOUNTER — Telehealth: Payer: Self-pay | Admitting: Family Medicine

## 2012-05-12 MED ORDER — AMLODIPINE BESYLATE 5 MG PO TABS: 5.0000 mg | ORAL_TABLET | Freq: Every day | ORAL | Status: DC

## 2012-05-12 NOTE — Telephone Encounter (Signed)
I have not spoken to the pt but did refill amlodipine 5mg  #90 with 0 additional refills sent to [facility].  She will need to come in and be seen in the office by [facility].  She is past due.  Last seen 09/14/11.  Please call the pt and make an appt.  Thanks!!

## 2012-05-13 DIAGNOSIS — J309 Allergic rhinitis, unspecified: Secondary | ICD-10-CM | POA: Diagnosis not present

## 2012-05-13 NOTE — Telephone Encounter (Signed)
Pt is sch for 4-8

## 2012-05-26 DIAGNOSIS — J309 Allergic rhinitis, unspecified: Secondary | ICD-10-CM | POA: Diagnosis not present

## 2012-06-05 DIAGNOSIS — J309 Allergic rhinitis, unspecified: Secondary | ICD-10-CM | POA: Diagnosis not present

## 2012-06-10 ENCOUNTER — Encounter: Payer: Self-pay | Admitting: Internal Medicine

## 2012-06-10 ENCOUNTER — Ambulatory Visit (INDEPENDENT_AMBULATORY_CARE_PROVIDER_SITE_OTHER): Payer: Medicare Other | Admitting: Internal Medicine

## 2012-06-10 VITALS — BP 140/70 | HR 78 | Temp 98.4°F | Wt 168.0 lb

## 2012-06-10 DIAGNOSIS — R0989 Other specified symptoms and signs involving the circulatory and respiratory systems: Secondary | ICD-10-CM

## 2012-06-10 DIAGNOSIS — E876 Hypokalemia: Secondary | ICD-10-CM | POA: Diagnosis not present

## 2012-06-10 DIAGNOSIS — I10 Essential (primary) hypertension: Secondary | ICD-10-CM

## 2012-06-10 DIAGNOSIS — R0609 Other forms of dyspnea: Secondary | ICD-10-CM

## 2012-06-10 DIAGNOSIS — J45909 Unspecified asthma, uncomplicated: Secondary | ICD-10-CM

## 2012-06-10 DIAGNOSIS — E0590 Thyrotoxicosis, unspecified without thyrotoxic crisis or storm: Secondary | ICD-10-CM | POA: Diagnosis not present

## 2012-06-10 MED ORDER — AMLODIPINE BESYLATE 5 MG PO TABS: 5.0000 mg | ORAL_TABLET | Freq: Two times a day (BID) | ORAL | Status: DC

## 2012-06-10 NOTE — Patient Instructions (Signed)
Continue bp monitoring   Have dr Lucianne Muss send [place] a copy of any labs done  You are due for lipid panel if not done since here.   ROV in   4 months or as needed we can do labs needed at that time .

## 2012-06-10 NOTE — Progress Notes (Signed)
Chief Complaint  Patient presents with  . Follow-up    HPI: Patient comes in today for follow up of  multiple medical problems.  Since last visit  She has had a cardiac evaluation for sob doe  Not felt to be cardiac  But asthmatic .  Inhaler technique  Improved and some help. Inc amlodipine to 5 bid for a few weeks  bp in the 130 range ? No se. Assessment from cardiology in feb. Marland Kitchen "Her stress Myoview study was normal. Her echocardiogram is normal. She seems to be doing a lot better after changing her inhalers. I suspect a lot of her symptoms were due to asthma.  At this point we will turn her back over to Dr. Fabian Sharp. "   Her blood pressure remains elevated despite doubling her lisinopril HCTZ. We could consider  changing the ACE inhibitor to an ARB ( Losartan, Micardis, diovan or Benicar) which may be a more effective antihypertensive. Other alternatives include a hydralazine. Another alternative would be to increase her amlodipine although she already has occasional episodes of mild ankle edema and the increased dose of amlodipine may worsen that.  Her heart rate is already fairly slow so I do not think that adding a beta blocker would be helpful.  We will have her followup with Dr. Fabian Sharp for this issue.""       No falling new sx   To see dr Lucianne Muss this week and have lab told she doesn't have a thyroid  Condition.  Uncertain when last lipid panel done . Doesn't think simva causing se Dies get aches and pains and takes excedrin at times  Thinks it is arthritis and not meds.    ROS: See pertinent positives and negatives per HPI.  Past Medical History  Diagnosis Date  . Allergy   . Asthma   . GERD (gastroesophageal reflux disease)   . Hyperlipidemia   . Hypertension   . Arthritis   . Anemia   . Cancer     rt breast    Family History  Problem Relation Age of Onset  . Cancer Mother     breast and ovarian    History   Social History  . Marital Status: Widowed   Spouse Name: N/A    Number of Children: N/A  . Years of Education: N/A   Social History Main Topics  . Smoking status: Former Games developer  . Smokeless tobacco: None  . Alcohol Use: No  . Drug Use: No  . Sexually Active: None   Other Topics Concern  . None   Social History Narrative   Widowed 2001    Ex smoker   HHof 1    Active in church and volunteer activities    Bowls a few times per week   Swimming 2 x per week.    Tobacco 49=58 less than 1ppd.              Outpatient Encounter Prescriptions as of 06/10/2012  Medication Sig Dispense Refill  . amLODipine (NORVASC) 5 MG tablet Take 1 tablet (5 mg total) by mouth 2 (two) times daily.  180 tablet  1  . budesonide-formoterol (SYMBICORT) 160-4.5 MCG/ACT inhaler Inhale 2 puffs into the lungs as needed.        Marland Kitchen lisinopril-hydrochlorothiazide (PRINZIDE,ZESTORETIC) 20-12.5 MG per tablet Take 2 tablets by mouth daily.  180 tablet  3  . montelukast (SINGULAIR) 10 MG tablet Take 10 mg by mouth at bedtime.        Marland Kitchen  olopatadine (PATANOL) 0.1 % ophthalmic solution Place 1 drop into both eyes as needed.        Marland Kitchen omeprazole (PRILOSEC) 20 MG capsule Take 1 capsule (20 mg total) by mouth daily.  90 capsule  3  . potassium chloride SA (K-DUR,KLOR-CON) 20 MEQ tablet Take 1 tablet (20 mEq total) by mouth 2 (two) times daily.  180 tablet  3  . simvastatin (ZOCOR) 20 MG tablet Take 20 mg by mouth at bedtime.        . solifenacin (VESICARE) 5 MG tablet Take 1 tablet (5 mg total) by mouth daily.  30 tablet  0  . [DISCONTINUED] amLODipine (NORVASC) 5 MG tablet Take 1 tablet (5 mg total) by mouth daily.  90 tablet  0  . [DISCONTINUED] cholecalciferol (VITAMIN D) 1000 UNITS tablet Take 1,000 Units by mouth daily.      . [DISCONTINUED] Ferrous Sulfate Dried (FEOSOL) 200 (65 FE) MG TABS Take 200 mg by mouth daily.         No facility-administered encounter medications on file as of 06/10/2012.    EXAM:  BP 140/70  Pulse 78  Temp(Src) 98.4 F (36.9 C)  (Oral)  Wt 168 lb (76.204 kg)  BMI 32.26 kg/m2  SpO2 96%  Body mass index is 32.26 kg/(m^2).  GENERAL: vitals reviewed and listed above, alert, oriented, appears well hydrated and in no acute distress  HEENT: atraumatic, conjunctiva  clear, no obvious abnormalities on inspection of external nose and ears NECK: no obvious masses on inspection palpation   LUNGS: clear to auscultation bilaterally, no wheezes, rales or rhonchi, good air movement  CV: HRRR, no clubbing cyanosis 1+  peripheral edema nl cap refill   MS: moves all extremities without noticeable focal  Abnormality   OA changes  Noted in hands   PSYCH: pleasant and cooperative, no obvious depression or anxiety Lab Results  Component Value Date   WBC 7.7 04/11/2011   HGB 12.4 04/11/2011   HCT 38.0 04/11/2011   PLT 288.0 04/11/2011   GLUCOSE 94 03/25/2012   CHOL 188 04/11/2011   TRIG 112.0 04/11/2011   HDL 63.10 04/11/2011   LDLCALC 103* 04/11/2011   ALT 17 04/11/2011   AST 15 04/11/2011   NA 137 03/25/2012   K 3.5 03/25/2012   CL 102 03/25/2012   CREATININE 0.7 03/25/2012   BUN 15 03/25/2012   CO2 28 03/25/2012   TSH 0.29* 05/29/2011    ASSESSMENT AND PLAN:  Discussed the following assessment and plan:  HYPERTENSION - impr on 10 amlodipine. follow up   Subclinical hyperthyroidism - maybe  kumar to fu .  Hypokalemia  DYSPNEA ON EXERTION - neg cv assessment felt to be from asthma .   ASTHMA At fu  Ensure that  Cbc and lipids have been done in past year .  -Patient advised to return or notify health care team  if symptoms worsen or persist or new concerns arise.  Patient Instructions  Continue bp monitoring   Have dr Lucianne Muss send [place] a copy of any labs done  You are due for lipid panel if not done since here.   ROV in   4 months or as needed we can do labs needed at that time .     Neta Mends. Orli Degrave M.D.

## 2012-06-12 DIAGNOSIS — E049 Nontoxic goiter, unspecified: Secondary | ICD-10-CM | POA: Diagnosis not present

## 2012-06-12 DIAGNOSIS — J309 Allergic rhinitis, unspecified: Secondary | ICD-10-CM | POA: Diagnosis not present

## 2012-06-23 DIAGNOSIS — J309 Allergic rhinitis, unspecified: Secondary | ICD-10-CM | POA: Diagnosis not present

## 2012-07-01 DIAGNOSIS — J309 Allergic rhinitis, unspecified: Secondary | ICD-10-CM | POA: Diagnosis not present

## 2012-07-09 DIAGNOSIS — J309 Allergic rhinitis, unspecified: Secondary | ICD-10-CM | POA: Diagnosis not present

## 2012-07-15 DIAGNOSIS — J309 Allergic rhinitis, unspecified: Secondary | ICD-10-CM | POA: Diagnosis not present

## 2012-08-04 DIAGNOSIS — J309 Allergic rhinitis, unspecified: Secondary | ICD-10-CM | POA: Diagnosis not present

## 2012-08-07 ENCOUNTER — Telehealth: Payer: Self-pay | Admitting: Internal Medicine

## 2012-08-07 NOTE — Telephone Encounter (Signed)
Pt called and stated that she will be ordering a back brace from [employer]. The company stated that they would need Dr. Fabian Sharp to call and sign off on this order, please call Annice Pih [phone] ext [phone]. Please assist.

## 2012-08-11 NOTE — Telephone Encounter (Signed)
Informed the pt that Dr. Fabian Sharp does not do this.  She has seen ortho in the past and will call their office.

## 2012-08-11 NOTE — Telephone Encounter (Signed)
Left message on home phone for the pt to return my call. 

## 2012-08-11 NOTE — Telephone Encounter (Signed)
i do not prescribe back braces .  Please  Inform patient that if she feels  That this is medically necessary she should consult with ortho physical medicine or other back specialist  Who may or may not prescribe this  Equipment.

## 2012-08-15 DIAGNOSIS — J309 Allergic rhinitis, unspecified: Secondary | ICD-10-CM | POA: Diagnosis not present

## 2012-08-19 DIAGNOSIS — J309 Allergic rhinitis, unspecified: Secondary | ICD-10-CM | POA: Diagnosis not present

## 2012-08-25 DIAGNOSIS — J309 Allergic rhinitis, unspecified: Secondary | ICD-10-CM | POA: Diagnosis not present

## 2012-09-03 DIAGNOSIS — J309 Allergic rhinitis, unspecified: Secondary | ICD-10-CM | POA: Diagnosis not present

## 2012-09-04 DIAGNOSIS — J309 Allergic rhinitis, unspecified: Secondary | ICD-10-CM | POA: Diagnosis not present

## 2012-09-09 DIAGNOSIS — J309 Allergic rhinitis, unspecified: Secondary | ICD-10-CM | POA: Diagnosis not present

## 2012-09-10 ENCOUNTER — Other Ambulatory Visit: Payer: Self-pay | Admitting: Internal Medicine

## 2012-09-10 MED ORDER — OMEPRAZOLE 20 MG PO CPDR: 20.0000 mg | DELAYED_RELEASE_CAPSULE | Freq: Every day | ORAL | Status: DC

## 2012-09-15 DIAGNOSIS — J309 Allergic rhinitis, unspecified: Secondary | ICD-10-CM | POA: Diagnosis not present

## 2012-09-22 DIAGNOSIS — J309 Allergic rhinitis, unspecified: Secondary | ICD-10-CM | POA: Diagnosis not present

## 2012-09-26 DIAGNOSIS — M48061 Spinal stenosis, lumbar region without neurogenic claudication: Secondary | ICD-10-CM | POA: Diagnosis not present

## 2012-09-30 DIAGNOSIS — J309 Allergic rhinitis, unspecified: Secondary | ICD-10-CM | POA: Diagnosis not present

## 2012-10-02 DIAGNOSIS — J309 Allergic rhinitis, unspecified: Secondary | ICD-10-CM | POA: Diagnosis not present

## 2012-10-06 DIAGNOSIS — J309 Allergic rhinitis, unspecified: Secondary | ICD-10-CM | POA: Diagnosis not present

## 2012-10-15 DIAGNOSIS — J309 Allergic rhinitis, unspecified: Secondary | ICD-10-CM | POA: Diagnosis not present

## 2012-10-20 DIAGNOSIS — J309 Allergic rhinitis, unspecified: Secondary | ICD-10-CM | POA: Diagnosis not present

## 2012-10-27 DIAGNOSIS — J309 Allergic rhinitis, unspecified: Secondary | ICD-10-CM | POA: Diagnosis not present

## 2012-10-27 DIAGNOSIS — J45909 Unspecified asthma, uncomplicated: Secondary | ICD-10-CM | POA: Diagnosis not present

## 2012-10-27 DIAGNOSIS — J3089 Other allergic rhinitis: Secondary | ICD-10-CM | POA: Diagnosis not present

## 2012-10-27 DIAGNOSIS — J301 Allergic rhinitis due to pollen: Secondary | ICD-10-CM | POA: Diagnosis not present

## 2012-10-27 DIAGNOSIS — H1045 Other chronic allergic conjunctivitis: Secondary | ICD-10-CM | POA: Diagnosis not present

## 2012-10-31 DIAGNOSIS — J309 Allergic rhinitis, unspecified: Secondary | ICD-10-CM | POA: Diagnosis not present

## 2012-11-19 DIAGNOSIS — J309 Allergic rhinitis, unspecified: Secondary | ICD-10-CM | POA: Diagnosis not present

## 2012-11-24 DIAGNOSIS — J309 Allergic rhinitis, unspecified: Secondary | ICD-10-CM | POA: Diagnosis not present

## 2012-11-28 ENCOUNTER — Ambulatory Visit (INDEPENDENT_AMBULATORY_CARE_PROVIDER_SITE_OTHER): Payer: Medicare Other

## 2012-11-28 DIAGNOSIS — Z23 Encounter for immunization: Secondary | ICD-10-CM | POA: Diagnosis not present

## 2012-12-01 DIAGNOSIS — J309 Allergic rhinitis, unspecified: Secondary | ICD-10-CM | POA: Diagnosis not present

## 2012-12-08 DIAGNOSIS — J309 Allergic rhinitis, unspecified: Secondary | ICD-10-CM | POA: Diagnosis not present

## 2012-12-16 DIAGNOSIS — J309 Allergic rhinitis, unspecified: Secondary | ICD-10-CM | POA: Diagnosis not present

## 2012-12-24 DIAGNOSIS — J309 Allergic rhinitis, unspecified: Secondary | ICD-10-CM | POA: Diagnosis not present

## 2012-12-30 ENCOUNTER — Other Ambulatory Visit: Payer: Self-pay | Admitting: Internal Medicine

## 2012-12-30 DIAGNOSIS — J309 Allergic rhinitis, unspecified: Secondary | ICD-10-CM | POA: Diagnosis not present

## 2012-12-31 NOTE — Telephone Encounter (Signed)
Last seen in April.  Should have returned in Aug for lab work.  No future appt scheduled.  Do you want to order labs and a return OV?

## 2012-12-31 NOTE — Telephone Encounter (Signed)
Ok to refill x 6 months  But have her make appt for OV before end of year.

## 2013-01-01 ENCOUNTER — Telehealth: Payer: Self-pay | Admitting: Family Medicine

## 2013-01-01 NOTE — Telephone Encounter (Signed)
Per [facility], this pt needs to be seen before the end of the year.  Please call the patient and schedule when convenient.  Thanks!

## 2013-01-06 DIAGNOSIS — J309 Allergic rhinitis, unspecified: Secondary | ICD-10-CM | POA: Diagnosis not present

## 2013-01-13 DIAGNOSIS — J309 Allergic rhinitis, unspecified: Secondary | ICD-10-CM | POA: Diagnosis not present

## 2013-01-20 ENCOUNTER — Encounter (INDEPENDENT_AMBULATORY_CARE_PROVIDER_SITE_OTHER): Payer: Self-pay | Admitting: General Surgery

## 2013-01-20 ENCOUNTER — Encounter (INDEPENDENT_AMBULATORY_CARE_PROVIDER_SITE_OTHER): Payer: Self-pay

## 2013-01-20 ENCOUNTER — Ambulatory Visit (INDEPENDENT_AMBULATORY_CARE_PROVIDER_SITE_OTHER): Payer: Medicare Other | Admitting: General Surgery

## 2013-01-20 VITALS — BP 130/70 | HR 100 | Temp 99.5°F | Resp 20 | Ht 60.0 in | Wt 166.0 lb

## 2013-01-20 DIAGNOSIS — C50919 Malignant neoplasm of unspecified site of unspecified female breast: Secondary | ICD-10-CM | POA: Diagnosis not present

## 2013-01-20 DIAGNOSIS — J309 Allergic rhinitis, unspecified: Secondary | ICD-10-CM | POA: Diagnosis not present

## 2013-01-20 NOTE — Progress Notes (Signed)
Subjective:     Patient ID: [name], female   DOB: 1930-11-06, 79 y.o.   MRN: [id number]  HPI The patient is a 79 year old black female who is almost 3 years out from a right lumpectomy and negative sentinel node biopsy for a T1 B. N0 right breast cancer. She received radiation therapy but declined chemotherapy or antiestrogen therapy. Her last mammogram was in January that showed no evidence of malignancy. She has not come back to [place] in about 2 years. She denies any major medical problems since her last visit. Her only complaint is that 2 weeks ago she had some tingling in the right breast but this has resolved. She denies any discharge from her nipple.  Review of Systems  Constitutional: Negative.   HENT: Negative.   Eyes: Negative.   Respiratory: Negative.   Cardiovascular: Negative.   Gastrointestinal: Negative.   Endocrine: Negative.   Genitourinary: Negative.   Musculoskeletal: Negative.   Skin: Negative.   Allergic/Immunologic: Negative.   Neurological: Negative.   Hematological: Negative.   Psychiatric/Behavioral: Negative.        Objective:   Physical Exam  Constitutional: She is oriented to person, place, and time. She appears well-developed and well-nourished.  HENT:  Head: Normocephalic and atraumatic.  Eyes: Conjunctivae and EOM are normal. Pupils are equal, round, and reactive to light.  Neck: Normal range of motion. Neck supple.  Cardiovascular: Normal rate, regular rhythm and normal heart sounds.   Pulmonary/Chest: Effort normal and breath sounds normal.  Her right breast and axillary incisions have healed nicely. There is no palpable mass in either breast. There is no palpable axillary, supraclavicular, or cervical lymphadenopathy.  Abdominal: Bowel sounds are normal. She exhibits no mass. There is no tenderness.  Musculoskeletal: Normal range of motion.  Lymphadenopathy:    She has no cervical adenopathy.  Neurological: She is alert and oriented to  person, place, and time.  Skin: Skin is warm and dry.  Psychiatric: She has a normal mood and affect. Her behavior is normal.       Assessment:     The patient is 3 years status post right lumpectomy for breast cancer     Plan:     At this point she will continue to do regular self exams. Her next mammogram will be given January. I will plan to see her back in about 6 months.

## 2013-01-20 NOTE — Patient Instructions (Signed)
Continue regular self exams  

## 2013-01-26 DIAGNOSIS — J309 Allergic rhinitis, unspecified: Secondary | ICD-10-CM | POA: Diagnosis not present

## 2013-02-02 DIAGNOSIS — J309 Allergic rhinitis, unspecified: Secondary | ICD-10-CM | POA: Diagnosis not present

## 2013-02-09 DIAGNOSIS — J309 Allergic rhinitis, unspecified: Secondary | ICD-10-CM | POA: Diagnosis not present

## 2013-02-11 ENCOUNTER — Other Ambulatory Visit (INDEPENDENT_AMBULATORY_CARE_PROVIDER_SITE_OTHER): Payer: Self-pay | Admitting: General Surgery

## 2013-02-11 DIAGNOSIS — Z853 Personal history of malignant neoplasm of breast: Secondary | ICD-10-CM

## 2013-02-18 DIAGNOSIS — J309 Allergic rhinitis, unspecified: Secondary | ICD-10-CM | POA: Diagnosis not present

## 2013-02-23 DIAGNOSIS — J309 Allergic rhinitis, unspecified: Secondary | ICD-10-CM | POA: Diagnosis not present

## 2013-02-24 DIAGNOSIS — J309 Allergic rhinitis, unspecified: Secondary | ICD-10-CM | POA: Diagnosis not present

## 2013-03-02 DIAGNOSIS — J309 Allergic rhinitis, unspecified: Secondary | ICD-10-CM | POA: Diagnosis not present

## 2013-03-09 DIAGNOSIS — J309 Allergic rhinitis, unspecified: Secondary | ICD-10-CM | POA: Diagnosis not present

## 2013-03-18 DIAGNOSIS — J309 Allergic rhinitis, unspecified: Secondary | ICD-10-CM | POA: Diagnosis not present

## 2013-03-25 DIAGNOSIS — J309 Allergic rhinitis, unspecified: Secondary | ICD-10-CM | POA: Diagnosis not present

## 2013-03-30 DIAGNOSIS — J309 Allergic rhinitis, unspecified: Secondary | ICD-10-CM | POA: Diagnosis not present

## 2013-03-31 DIAGNOSIS — J309 Allergic rhinitis, unspecified: Secondary | ICD-10-CM | POA: Diagnosis not present

## 2013-04-03 ENCOUNTER — Ambulatory Visit
Admission: RE | Admit: 2013-04-03 | Discharge: 2013-04-03 | Disposition: A | Discharge status: Home / Self Care | Payer: Medicare Other | Source: Ambulatory Visit | Attending: General Surgery | Admitting: General Surgery

## 2013-04-03 DIAGNOSIS — Z853 Personal history of malignant neoplasm of breast: Secondary | ICD-10-CM

## 2013-04-07 DIAGNOSIS — J309 Allergic rhinitis, unspecified: Secondary | ICD-10-CM | POA: Diagnosis not present

## 2013-04-14 DIAGNOSIS — J309 Allergic rhinitis, unspecified: Secondary | ICD-10-CM | POA: Diagnosis not present

## 2013-04-23 DIAGNOSIS — J309 Allergic rhinitis, unspecified: Secondary | ICD-10-CM | POA: Diagnosis not present

## 2013-04-27 DIAGNOSIS — J309 Allergic rhinitis, unspecified: Secondary | ICD-10-CM | POA: Diagnosis not present

## 2013-05-04 DIAGNOSIS — J309 Allergic rhinitis, unspecified: Secondary | ICD-10-CM | POA: Diagnosis not present

## 2013-05-11 DIAGNOSIS — J309 Allergic rhinitis, unspecified: Secondary | ICD-10-CM | POA: Diagnosis not present

## 2013-05-18 DIAGNOSIS — J309 Allergic rhinitis, unspecified: Secondary | ICD-10-CM | POA: Diagnosis not present

## 2013-05-25 DIAGNOSIS — J309 Allergic rhinitis, unspecified: Secondary | ICD-10-CM | POA: Diagnosis not present

## 2013-06-01 DIAGNOSIS — J309 Allergic rhinitis, unspecified: Secondary | ICD-10-CM | POA: Diagnosis not present

## 2013-06-08 DIAGNOSIS — J309 Allergic rhinitis, unspecified: Secondary | ICD-10-CM | POA: Diagnosis not present

## 2013-06-12 ENCOUNTER — Telehealth: Payer: Self-pay | Admitting: Internal Medicine

## 2013-06-12 ENCOUNTER — Ambulatory Visit (INDEPENDENT_AMBULATORY_CARE_PROVIDER_SITE_OTHER): Payer: Medicare Other | Admitting: Internal Medicine

## 2013-06-12 ENCOUNTER — Encounter: Payer: Self-pay | Admitting: Internal Medicine

## 2013-06-12 VITALS — BP 140/72 | HR 74 | Temp 98.8°F | Ht 60.75 in | Wt 164.0 lb

## 2013-06-12 DIAGNOSIS — R609 Edema, unspecified: Secondary | ICD-10-CM

## 2013-06-12 DIAGNOSIS — K219 Gastro-esophageal reflux disease without esophagitis: Secondary | ICD-10-CM

## 2013-06-12 DIAGNOSIS — R946 Abnormal results of thyroid function studies: Secondary | ICD-10-CM

## 2013-06-12 DIAGNOSIS — E785 Hyperlipidemia, unspecified: Secondary | ICD-10-CM

## 2013-06-12 DIAGNOSIS — Z23 Encounter for immunization: Secondary | ICD-10-CM

## 2013-06-12 DIAGNOSIS — I10 Essential (primary) hypertension: Secondary | ICD-10-CM | POA: Diagnosis not present

## 2013-06-12 DIAGNOSIS — C50919 Malignant neoplasm of unspecified site of unspecified female breast: Secondary | ICD-10-CM

## 2013-06-12 DIAGNOSIS — E876 Hypokalemia: Secondary | ICD-10-CM

## 2013-06-12 DIAGNOSIS — Z0000 Encounter for general adult medical examination without abnormal findings: Secondary | ICD-10-CM

## 2013-06-12 DIAGNOSIS — R0789 Other chest pain: Secondary | ICD-10-CM

## 2013-06-12 DIAGNOSIS — J45909 Unspecified asthma, uncomplicated: Secondary | ICD-10-CM

## 2013-06-12 DIAGNOSIS — R7989 Other specified abnormal findings of blood chemistry: Secondary | ICD-10-CM

## 2013-06-12 LAB — CBC WITH DIFFERENTIAL/PLATELET
Basophils Absolute: 0 10*3/uL (ref 0.0–0.1)
Basophils Relative: 0.5 % (ref 0.0–3.0)
Eosinophils Absolute: 0.3 10*3/uL (ref 0.0–0.7)
Eosinophils Relative: 4.4 % (ref 0.0–5.0)
HCT: 33.8 % — ABNORMAL LOW (ref 36.0–46.0)
Hemoglobin: 10.7 g/dL — ABNORMAL LOW (ref 12.0–15.0)
Lymphocytes Relative: 22.1 % (ref 12.0–46.0)
Lymphs Abs: 1.6 10*3/uL (ref 0.7–4.0)
MCHC: 31.7 g/dL (ref 30.0–36.0)
MCV: 74.9 fl — ABNORMAL LOW (ref 78.0–100.0)
Monocytes Absolute: 0.6 10*3/uL (ref 0.1–1.0)
Monocytes Relative: 9 % (ref 3.0–12.0)
Neutro Abs: 4.5 10*3/uL (ref 1.4–7.7)
Neutrophils Relative %: 64 % (ref 43.0–77.0)
Platelets: 404 10*3/uL — ABNORMAL HIGH (ref 150.0–400.0)
RBC: 4.51 Mil/uL (ref 3.87–5.11)
RDW: 23.8 % — ABNORMAL HIGH (ref 11.5–14.6)
WBC: 7.1 10*3/uL (ref 4.5–10.5)

## 2013-06-12 LAB — BASIC METABOLIC PANEL
BUN: 13 mg/dL (ref 6–23)
CO2: 30 mEq/L (ref 19–32)
Calcium: 9.3 mg/dL (ref 8.4–10.5)
Chloride: 102 mEq/L (ref 96–112)
Creatinine, Ser: 0.7 mg/dL (ref 0.4–1.2)
GFR: 106.8 mL/min (ref >60.00)
Glucose, Bld: 86 mg/dL (ref 70–99)
Potassium: 3.6 mEq/L (ref 3.5–5.1)
Sodium: 140 mEq/L (ref 135–145)

## 2013-06-12 LAB — HEPATIC FUNCTION PANEL
ALT: 17 U/L (ref 0–35)
AST: 24 U/L (ref 0–37)
Albumin: 3.7 g/dL (ref 3.5–5.2)
Alkaline Phosphatase: 66 U/L (ref 39–117)
Bilirubin, Direct: 0 mg/dL (ref 0.0–0.3)
Total Bilirubin: 0.4 mg/dL (ref 0.3–1.2)
Total Protein: 7.4 g/dL (ref 6.0–8.3)

## 2013-06-12 LAB — LIPID PANEL
Cholesterol: 195 mg/dL (ref 0–200)
HDL: 64.2 mg/dL (ref >39.00)
LDL Cholesterol: 111 mg/dL — ABNORMAL HIGH (ref 0–99)
Total CHOL/HDL Ratio: 3
Triglycerides: 100 mg/dL (ref 0.0–149.0)
VLDL: 20 mg/dL (ref 0.0–40.0)

## 2013-06-12 LAB — T4, FREE: Free T4: 0.78 ng/dL (ref 0.60–1.60)

## 2013-06-12 LAB — TSH: TSH: 0.48 u[IU]/mL (ref 0.35–5.50)

## 2013-06-12 MED ORDER — SIMVASTATIN 20 MG PO TABS: 20.0000 mg | ORAL_TABLET | Freq: Every day | ORAL | Status: DC

## 2013-06-12 MED ORDER — OMEPRAZOLE 20 MG PO CPDR: 20.0000 mg | DELAYED_RELEASE_CAPSULE | Freq: Every day | ORAL | Status: DC

## 2013-06-12 MED ORDER — AMLODIPINE BESYLATE 5 MG PO TABS: ORAL_TABLET | ORAL | Status: DC

## 2013-06-12 MED ORDER — SOLIFENACIN SUCCINATE 5 MG PO TABS: 5.0000 mg | ORAL_TABLET | Freq: Every day | ORAL | Status: DC

## 2013-06-12 MED ORDER — LISINOPRIL-HYDROCHLOROTHIAZIDE 20-12.5 MG PO TABS: 2.0000 | ORAL_TABLET | Freq: Every day | ORAL | Status: DC

## 2013-06-12 MED ORDER — POTASSIUM CHLORIDE CRYS ER 20 MEQ PO TBCR: 20.0000 meq | EXTENDED_RELEASE_TABLET | Freq: Two times a day (BID) | ORAL | Status: DC

## 2013-06-12 NOTE — Telephone Encounter (Signed)
Pt called and said she was given a injection today 06/12/13 and would like to know what type of injection it was .

## 2013-06-12 NOTE — Assessment & Plan Note (Signed)
Seeing yearly at endo

## 2013-06-12 NOTE — Patient Instructions (Signed)
Continue lifestyle intervention healthy eating and exercise .  Can get tetnus booster at any time  Shingles vaccine  Check into reimbursement. And can get at any time.  Make sure bp is controlled  recheck as needed for this  If well  And labs ok CPX in a year .   Fall Prevention and Home Safety Falls cause injuries and can affect all age groups. It is possible to use preventive measures to significantly decrease the likelihood of falls. There are many simple measures which can make your home safer and prevent falls. OUTDOORS  Repair cracks and edges of walkways and driveways.  Remove high doorway thresholds.  Trim shrubbery on the main path into your home.  Have good outside lighting.  Clear walkways of tools, rocks, debris, and clutter.  Check that handrails are not broken and are securely fastened. Both sides of steps should have handrails.  Have leaves, snow, and ice cleared regularly.  Use sand or salt on walkways during winter months.  In the garage, clean up grease or oil spills. BATHROOM  Install night lights.  Install grab bars by the toilet and in the tub and shower.  Use non-skid mats or decals in the tub or shower.  Place a plastic non-slip stool in the shower to sit on, if needed.  Keep floors dry and clean up all water on the floor immediately.  Remove soap buildup in the tub or shower on a regular basis.  Secure bath mats with non-slip, double-sided rug tape.  Remove throw rugs and tripping hazards from the floors. BEDROOMS  Install night lights.  Make sure a bedside light is easy to reach.  Do not use oversized bedding.  Keep a telephone by your bedside.  Have a firm chair with side arms to use for getting dressed.  Remove throw rugs and tripping hazards from the floor. KITCHEN  Keep handles on pots and pans turned toward the center of the stove. Use back burners when possible.  Clean up spills quickly and allow time for drying.  Avoid  walking on wet floors.  Avoid hot utensils and knives.  Position shelves so they are not too high or low.  Place commonly used objects within easy reach.  If necessary, use a sturdy step stool with a grab bar when reaching.  Keep electrical cables out of the way.  Do not use floor polish or wax that makes floors slippery. If you must use wax, use non-skid floor wax.  Remove throw rugs and tripping hazards from the floor. STAIRWAYS  Never leave objects on stairs.  Place handrails on both sides of stairways and use them. Fix any loose handrails. Make sure handrails on both sides of the stairways are as long as the stairs.  Check carpeting to make sure it is firmly attached along stairs. Make repairs to worn or loose carpet promptly.  Avoid placing throw rugs at the top or bottom of stairways, or properly secure the rug with carpet tape to prevent slippage. Get rid of throw rugs, if possible.  Have an electrician put in a light switch at the top and bottom of the stairs. OTHER FALL PREVENTION TIPS  Wear low-heel or rubber-soled shoes that are supportive and fit well. Wear closed toe shoes.  When using a stepladder, make sure it is fully opened and both spreaders are firmly locked. Do not climb a closed stepladder.  Add color or contrast paint or tape to grab bars and handrails in your home. Place  contrasting color strips on first and last steps.  Learn and use mobility aids as needed. Install an electrical emergency response system.  Turn on lights to avoid dark areas. Replace light bulbs that burn out immediately. Get light switches that glow.  Arrange furniture to create clear pathways. Keep furniture in the same place.  Firmly attach carpet with non-skid or double-sided tape.  Eliminate uneven floor surfaces.  Select a carpet pattern that does not visually hide the edge of steps.  Be aware of all pets. OTHER HOME SAFETY TIPS  Set the water temperature for 120 F  (48.8 C).  Keep emergency numbers on or near the telephone.  Keep smoke detectors on every level of the home and near sleeping areas. Document Released: 02/09/2002 Document Revised: 08/21/2011 Document Reviewed: 05/11/2011 [facility] Patient Information 2014 [place].

## 2013-06-12 NOTE — Telephone Encounter (Signed)
Relevant patient education mailed to patient.  

## 2013-06-12 NOTE — Progress Notes (Signed)
Chief Complaint  Patient presents with  . Medicare Wellness    medication  . Hypertension    HPI: Patient comes in today for Preventive Medicare wellness visit . No major injuries, ed visits ,hospitalizations , new medications since last visit. HT  Basically controlleda t home  On amlo and ace diuret combo  potassium LIPIDS  No probelms  Asthma/allergies  [name] . Edema about the same  Gi stable on prislosec Breast cancer The patient is 3 years status post right lumpectomy for breast cancer  Bladder on vesicare with help.  Health Maintenance  Topic Date Due  . Zostavax  06/08/1992  . Tetanus/tdap  07/22/2011  . Influenza Vaccine  10/03/2013  . Colonoscopy  07/29/2016  . Pneumococcal Polysaccharide Vaccine Age 35 And Over  Completed   Health Maintenance Review  Hearing: ok  Vision:  No limitations at present . Last eye check UTD  Safety:  Has smoke detector and wears seat belts.  No firearms. No excess sun exposure. Sees dentist regularly.  Falls: no  Advance directive :  Reviewed    Memory: Felt to be good  , no concern from her or her family.  Depression: No anhedonia unusual crying or depressive symptoms  Nutrition: Eats well balanced diet; adequate calcium and vitamin D. No swallowing chewing problems.  Injury: no major injuries in the last six months.  Other healthcare providers:  Reviewed today .  Social:  Lives alone. No pets.   Preventive parameters: up-to-date  Reviewed   ADLS:   There are no problems or need for assistance  driving, feeding, obtaining food, dressing, toileting and bathing, managing money using phone. She is independent.  EXERCISE/ HABITS  Per week activity  No tobacco    etoh volunteers still bowls  Bridge.   ROS:  GEN/ HEENT: No fever, significant weight changes sweats headaches vision problems hearing changes, CV/ PULM; No chest pain shortness of breath cough, syncopechange in exercise tolerance. GI /GU: No adominal  pain, vomiting, change in bowel habits. No blood in the stool. No significant GU symptoms. SKIN/HEME: ,no acute skin rashes suspicious lesions or bleeding. No lymphadenopathy, nodules, masses.  NEURO/ PSYCH:  No neurologic signs such as weakness numbness. No depression anxiety. IMM/ Allergy: No unusual infections.  Allergy .   REST of 12 system review negative except as per HPI   Past Medical History  Diagnosis Date  . Allergy   . Asthma   . GERD (gastroesophageal reflux disease)   . Hyperlipidemia   . Hypertension   . Arthritis   . Anemia   . Cancer     rt breast    Family History  Problem Relation Age of Onset  . Cancer Mother     breast and ovarian    History   Social History  . Marital Status: Widowed    Spouse Name: N/A    Number of Children: N/A  . Years of Education: N/A   Social History Main Topics  . Smoking status: Former Research scientist (life sciences)  . Smokeless tobacco: None  . Alcohol Use: No  . Drug Use: No  . Sexual Activity: None   Other Topics Concern  . None   Social History Narrative   Widowed 2001    Ex smoker   HHof 1    Active in church and volunteer activities    [place] a few times per week   Swimming 2 x per week.    Tobacco 49=58 less than 1ppd.  Outpatient Encounter Prescriptions as of 06/12/2013  Medication Sig  . amLODipine (NORVASC) 5 MG tablet TAKE 1 TABLET TWICE A DAY  . budesonide-formoterol (SYMBICORT) 160-4.5 MCG/ACT inhaler Inhale 2 puffs into the lungs as needed.    Marland Kitchen lisinopril-hydrochlorothiazide (PRINZIDE,ZESTORETIC) 20-12.5 MG per tablet Take 2 tablets by mouth daily.  Marland Kitchen olopatadine (PATANOL) 0.1 % ophthalmic solution Place 1 drop into both eyes as needed.   Marland Kitchen omeprazole (PRILOSEC) 20 MG capsule Take 1 capsule (20 mg total) by mouth daily.  . potassium chloride SA (K-DUR,KLOR-CON) 20 MEQ tablet Take 1 tablet (20 mEq total) by mouth 2 (two) times daily.  . simvastatin (ZOCOR) 20 MG tablet Take 1 tablet (20 mg total) by mouth  at bedtime.  . solifenacin (VESICARE) 5 MG tablet Take 1 tablet (5 mg total) by mouth daily.  . [DISCONTINUED] amLODipine (NORVASC) 5 MG tablet TAKE 1 TABLET TWICE A DAY  . [DISCONTINUED] lisinopril-hydrochlorothiazide (PRINZIDE,ZESTORETIC) 20-12.5 MG per tablet Take 2 tablets by mouth daily.  . [DISCONTINUED] omeprazole (PRILOSEC) 20 MG capsule Take 1 capsule (20 mg total) by mouth daily.  . [DISCONTINUED] potassium chloride SA (K-DUR,KLOR-CON) 20 MEQ tablet Take 1 tablet (20 mEq total) by mouth 2 (two) times daily.  . [DISCONTINUED] simvastatin (ZOCOR) 20 MG tablet Take 20 mg by mouth at bedtime.    . [DISCONTINUED] solifenacin (VESICARE) 5 MG tablet Take 1 tablet (5 mg total) by mouth daily.  . [DISCONTINUED] montelukast (SINGULAIR) 10 MG tablet Take 10 mg by mouth at bedtime.      EXAM:  BP 140/72  Pulse 74  Temp(Src) 98.8 F (37.1 C) (Oral)  Ht 5' 0.75" (1.543 m)  Wt 164 lb (74.39 kg)  BMI 31.25 kg/m2  SpO2 95%  Body mass index is 31.25 kg/(m^2).  Physical Exam: Vital signs reviewed [id number] is a well-developed well-nourished alert cooperative   who appears stated age in no acute distress.  HEENT: normocephalic atraumatic , Eyes: PERRL EOM's full, conjunctiva clear, Nares: paten,t no deformity discharge or tenderness., Ears: no deformity EAC's clear TMs with normal landmarks. Wax in right  Tm greyMouth: clear OP, no lesions, edema.  Moist mucous membranes. Dentition in adequate repair. NECK: supple without masses,or bruits. CHEST/PULM:  Clear to auscultation and percussion breath sounds equal no wheeze , rales or rhonchi. No chest wall deformities or tenderness. CV: PMI is nondisplaced, S1 S2 no gallops, murmurs, rubs. Peripheral pulses are full without delay.No JVD .  Breast: normal by inspection . No dimpling, discharge, masses, tenderness or discharge . ABDOMEN: Bowel sounds normal nontender  No guard or rebound, no hepato splenomegal no CVA tenderness.  No  hernia. Extremtities:  No clubbing cyanosis 1+edema changes  no acute joint swelling or redness no focal atrophy NEURO:  Oriented x3, cranial nerves 3-12 appear to be intact, no obvious focal weakness,gait within normal limits no abnormal reflexes or asymmetrical SKIN: No acute rashes normal turgor, color, no bruising or petechiae. PSYCH: Oriented, good eye contact, no obvious depression anxiety, cognition and judgment appear normal. LN: no cervical axillary inguinal adenopathy No noted deficits in memory, attention, and speech.   ASSESSMENT AND PLAN:  Discussed the following assessment and plan:  Visit for preventive health examination - Plan: Basic metabolic panel, CBC with Differential, Hepatic function panel, Lipid panel, TSH, T4, free  HYPERTENSION - Plan: Basic metabolic panel, CBC with Differential, Hepatic function panel, Lipid panel, TSH, T4, free  HYPERLIPIDEMIA - Plan: Basic metabolic panel, CBC with Differential, Hepatic function panel, Lipid panel, TSH, T4, free  CARCINOMA, BREAST - Plan: Basic metabolic panel, CBC with Differential, Hepatic function panel, Lipid panel, TSH, T4, free  Abnormal thyroid stimulating hormone level - Plan: Basic metabolic panel, CBC with Differential, Hepatic function panel, Lipid panel, TSH, T4, free  Need for vaccination with 13-polyvalent pneumococcal conjugate vaccine - Plan: Pneumococcal conjugate vaccine 13-valent  no chest pain - Plan: lisinopril-hydrochlorothiazide (PRINZIDE,ZESTORETIC) 20-12.5 MG per tablet  Hypokalemia - Plan: potassium chloride SA (K-DUR,KLOR-CON) 20 MEQ tablet  Unspecified asthma(493.90)  Edema  GERD Disc immuni only wants one today give prevnar   Plan td and shingles other time s Patient Care Team: Burnis Medin, MD as PCP - General (Internal Medicine) Merrie Roof, MD (General Surgery) Mosetta Anis, MD (Allergy) Inda Castle, MD (Gastroenterology)  Patient Instructions  Continue lifestyle  intervention healthy eating and exercise .  Can get tetnus booster at any time  Shingles vaccine  Check into reimbursement. And can get at any time.  Make sure bp is controlled  recheck as needed for this  If well  And labs ok CPX in a year .   Fall Prevention and Home Safety Falls cause injuries and can affect all age groups. It is possible to use preventive measures to significantly decrease the likelihood of falls. There are many simple measures which can make your home safer and prevent falls. OUTDOORS  Repair cracks and edges of walkways and driveways.  Remove high doorway thresholds.  Trim shrubbery on the main path into your home.  Have good outside lighting.  Clear walkways of tools, rocks, debris, and clutter.  Check that handrails are not broken and are securely fastened. Both sides of steps should have handrails.  Have leaves, snow, and ice cleared regularly.  Use sand or salt on walkways during winter months.  In the garage, clean up grease or oil spills. BATHROOM  Install night lights.  Install grab bars by the toilet and in the tub and shower.  Use non-skid mats or decals in the tub or shower.  Place a plastic non-slip stool in the shower to sit on, if needed.  Keep floors dry and clean up all water on the floor immediately.  Remove soap buildup in the tub or shower on a regular basis.  Secure bath mats with non-slip, double-sided rug tape.  Remove throw rugs and tripping hazards from the floors. BEDROOMS  Install night lights.  Make sure a bedside light is easy to reach.  Do not use oversized bedding.  Keep a telephone by your bedside.  Have a firm chair with side arms to use for getting dressed.  Remove throw rugs and tripping hazards from the floor. KITCHEN  Keep handles on pots and pans turned toward the center of the stove. Use back burners when possible.  Clean up spills quickly and allow time for drying.  Avoid walking on wet  floors.  Avoid hot utensils and knives.  Position shelves so they are not too high or low.  Place commonly used objects within easy reach.  If necessary, use a sturdy step stool with a grab bar when reaching.  Keep electrical cables out of the way.  Do not use floor polish or wax that makes floors slippery. If you must use wax, use non-skid floor wax.  Remove throw rugs and tripping hazards from the floor. STAIRWAYS  Never leave objects on stairs.  Place handrails on both sides of stairways and use them. Fix any loose handrails. Make sure handrails on both sides  of the stairways are as long as the stairs.  Check carpeting to make sure it is firmly attached along stairs. Make repairs to worn or loose carpet promptly.  Avoid placing throw rugs at the top or bottom of stairways, or properly secure the rug with carpet tape to prevent slippage. Get rid of throw rugs, if possible.  Have an electrician put in a light switch at the top and bottom of the stairs. OTHER FALL PREVENTION TIPS  Wear low-heel or rubber-soled shoes that are supportive and fit well. Wear closed toe shoes.  When using a stepladder, make sure it is fully opened and both spreaders are firmly locked. Do not climb a closed stepladder.  Add color or contrast paint or tape to grab bars and handrails in your home. Place contrasting color strips on first and last steps.  Learn and use mobility aids as needed. Install an electrical emergency response system.  Turn on lights to avoid dark areas. Replace light bulbs that burn out immediately. Get light switches that glow.  Arrange furniture to create clear pathways. Keep furniture in the same place.  Firmly attach carpet with non-skid or double-sided tape.  Eliminate uneven floor surfaces.  Select a carpet pattern that does not visually hide the edge of steps.  Be aware of all pets. OTHER HOME SAFETY TIPS  Set the water temperature for 120 F (48.8 C).  Keep  emergency numbers on or near the telephone.  Keep smoke detectors on every level of the home and near sleeping areas. Document Released: 02/09/2002 Document Revised: 08/21/2011 Document Reviewed: 05/11/2011 [facility] Patient Information 2014 [place].     Standley Brooking. Rex Magee M.D.  Pre visit review using our clinic review tool, if applicable. No additional management support is needed unless otherwise documented below in the visit note. Lab Results  Component Value Date   WBC 7.1 06/12/2013   HGB 10.7* 06/12/2013   HCT 33.8* 06/12/2013   PLT 404.0* 06/12/2013   GLUCOSE 86 06/12/2013   CHOL 195 06/12/2013   TRIG 100.0 06/12/2013   HDL 64.20 06/12/2013   LDLCALC 111* 06/12/2013   ALT 17 06/12/2013   AST 24 06/12/2013   NA 140 06/12/2013   K 3.6 06/12/2013   CL 102 06/12/2013   CREATININE 0.7 06/12/2013   BUN 13 06/12/2013   CO2 30 06/12/2013   TSH 0.48 06/12/2013

## 2013-06-12 NOTE — Telephone Encounter (Signed)
Patient notified she was given a Prevnar.  She needed the name for her records.

## 2013-06-13 ENCOUNTER — Encounter: Payer: Self-pay | Admitting: Internal Medicine

## 2013-06-17 ENCOUNTER — Other Ambulatory Visit: Payer: Self-pay | Admitting: Family Medicine

## 2013-06-17 DIAGNOSIS — D649 Anemia, unspecified: Secondary | ICD-10-CM

## 2013-06-17 DIAGNOSIS — J309 Allergic rhinitis, unspecified: Secondary | ICD-10-CM | POA: Diagnosis not present

## 2013-06-22 ENCOUNTER — Telehealth: Payer: Self-pay | Admitting: Internal Medicine

## 2013-06-22 DIAGNOSIS — D649 Anemia, unspecified: Secondary | ICD-10-CM

## 2013-06-22 NOTE — Telephone Encounter (Signed)
Pt calling to verify if she was referred to another doctor// pt thinks it is Dr  Elbert Ewings

## 2013-06-22 NOTE — Telephone Encounter (Signed)
i want her to see GI doctor and thought Dr Deatra Ina was her GI doctor   Dx anemia

## 2013-06-22 NOTE — Telephone Encounter (Signed)
Should patient be referred to Dr. Deatra Ina?

## 2013-06-23 ENCOUNTER — Encounter: Payer: Self-pay | Admitting: Gastroenterology

## 2013-06-23 ENCOUNTER — Other Ambulatory Visit (INDEPENDENT_AMBULATORY_CARE_PROVIDER_SITE_OTHER): Payer: Medicare Other

## 2013-06-23 DIAGNOSIS — J3089 Other allergic rhinitis: Secondary | ICD-10-CM | POA: Diagnosis not present

## 2013-06-23 DIAGNOSIS — D649 Anemia, unspecified: Secondary | ICD-10-CM | POA: Diagnosis not present

## 2013-06-23 DIAGNOSIS — J309 Allergic rhinitis, unspecified: Secondary | ICD-10-CM | POA: Diagnosis not present

## 2013-06-23 DIAGNOSIS — J301 Allergic rhinitis due to pollen: Secondary | ICD-10-CM | POA: Diagnosis not present

## 2013-06-23 DIAGNOSIS — H1045 Other chronic allergic conjunctivitis: Secondary | ICD-10-CM | POA: Diagnosis not present

## 2013-06-23 DIAGNOSIS — J45909 Unspecified asthma, uncomplicated: Secondary | ICD-10-CM | POA: Diagnosis not present

## 2013-06-23 LAB — CBC WITH DIFFERENTIAL/PLATELET
Basophils Absolute: 0 10*3/uL (ref 0.0–0.1)
Basophils Relative: 0.1 % (ref 0.0–3.0)
Eosinophils Absolute: 0.2 10*3/uL (ref 0.0–0.7)
Eosinophils Relative: 3 % (ref 0.0–5.0)
HCT: 34.2 % — ABNORMAL LOW (ref 36.0–46.0)
Hemoglobin: 10.9 g/dL — ABNORMAL LOW (ref 12.0–15.0)
Lymphocytes Relative: 19 % (ref 12.0–46.0)
Lymphs Abs: 1.4 10*3/uL (ref 0.7–4.0)
MCHC: 31.7 g/dL (ref 30.0–36.0)
MCV: 74.7 fl — ABNORMAL LOW (ref 78.0–100.0)
Monocytes Absolute: 0.5 10*3/uL (ref 0.1–1.0)
Monocytes Relative: 7.1 % (ref 3.0–12.0)
Neutro Abs: 5.2 10*3/uL (ref 1.4–7.7)
Neutrophils Relative %: 70.8 % (ref 43.0–77.0)
Platelets: 393 10*3/uL (ref 150.0–400.0)
RBC: 4.58 Mil/uL (ref 3.87–5.11)
RDW: 22.7 % — ABNORMAL HIGH (ref 11.5–14.6)
WBC: 7.3 10*3/uL (ref 4.5–10.5)

## 2013-06-23 LAB — IBC PANEL
Iron: 41 ug/dL — ABNORMAL LOW (ref 42–145)
Saturation Ratios: 11 % — ABNORMAL LOW (ref 20.0–50.0)
Transferrin: 265.7 mg/dL (ref 212.0–360.0)

## 2013-06-23 LAB — RETICULOCYTES
ABS Retic: 37.7 10*3/uL (ref 19.0–186.0)
RBC.: 4.71 MIL/uL (ref 3.87–5.11)
Retic Ct Pct: 0.8 % (ref 0.4–2.3)

## 2013-06-23 LAB — FERRITIN: Ferritin: 13.8 ng/mL (ref 10.0–291.0)

## 2013-06-23 NOTE — Telephone Encounter (Signed)
Order placed in the system. 

## 2013-06-23 NOTE — Telephone Encounter (Signed)
Pt walked into the office this morning and states she did see Dr. Deatra Ina a few years ago.  Pt states she called his office and did not know what she needed to be seen for so she could not make an appt and was told a referral was needed.  Ok per pt to do referral

## 2013-06-24 ENCOUNTER — Other Ambulatory Visit: Payer: Medicare Other

## 2013-06-24 DIAGNOSIS — J309 Allergic rhinitis, unspecified: Secondary | ICD-10-CM | POA: Diagnosis not present

## 2013-06-24 LAB — PATHOLOGIST SMEAR REVIEW

## 2013-06-25 ENCOUNTER — Other Ambulatory Visit: Payer: Self-pay | Admitting: Family Medicine

## 2013-06-30 DIAGNOSIS — J309 Allergic rhinitis, unspecified: Secondary | ICD-10-CM | POA: Diagnosis not present

## 2013-07-06 DIAGNOSIS — J309 Allergic rhinitis, unspecified: Secondary | ICD-10-CM | POA: Diagnosis not present

## 2013-07-13 DIAGNOSIS — J309 Allergic rhinitis, unspecified: Secondary | ICD-10-CM | POA: Diagnosis not present

## 2013-07-20 DIAGNOSIS — J309 Allergic rhinitis, unspecified: Secondary | ICD-10-CM | POA: Diagnosis not present

## 2013-07-28 DIAGNOSIS — J309 Allergic rhinitis, unspecified: Secondary | ICD-10-CM | POA: Diagnosis not present

## 2013-08-04 DIAGNOSIS — J309 Allergic rhinitis, unspecified: Secondary | ICD-10-CM | POA: Diagnosis not present

## 2013-08-06 DIAGNOSIS — J309 Allergic rhinitis, unspecified: Secondary | ICD-10-CM | POA: Diagnosis not present

## 2013-08-06 DIAGNOSIS — H2510 Age-related nuclear cataract, unspecified eye: Secondary | ICD-10-CM | POA: Diagnosis not present

## 2013-08-06 DIAGNOSIS — H04129 Dry eye syndrome of unspecified lacrimal gland: Secondary | ICD-10-CM | POA: Diagnosis not present

## 2013-08-25 ENCOUNTER — Encounter (HOSPITAL_COMMUNITY): Payer: Self-pay | Admitting: Pharmacy Technician

## 2013-08-25 ENCOUNTER — Ambulatory Visit (INDEPENDENT_AMBULATORY_CARE_PROVIDER_SITE_OTHER): Payer: Medicare Other | Admitting: Gastroenterology

## 2013-08-25 ENCOUNTER — Encounter: Payer: Self-pay | Admitting: Gastroenterology

## 2013-08-25 VITALS — BP 130/68 | HR 64 | Ht 60.0 in | Wt 160.0 lb

## 2013-08-25 DIAGNOSIS — D509 Iron deficiency anemia, unspecified: Secondary | ICD-10-CM

## 2013-08-25 DIAGNOSIS — K5521 Angiodysplasia of colon with hemorrhage: Secondary | ICD-10-CM | POA: Diagnosis not present

## 2013-08-25 DIAGNOSIS — J309 Allergic rhinitis, unspecified: Secondary | ICD-10-CM | POA: Diagnosis not present

## 2013-08-25 NOTE — Progress Notes (Signed)
_                                                                                                                History of Present Illness: Pleasant 80 year old Afro-American female referred for evaluation of anemia.  Patient has a history of an iron deficiency anemia for which she underwent workup in 2010.  Screening colonoscopy in 2008 demonstrated diverticulosis.  An iron deficiency in 2010 prompted evaluation by upper endoscopy and capsule endoscopy.  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  The patient takes supplementary iron.  She has no GI complaints including abdominal pain, change in bowel habits, melena or hematochezia.  Stools are dark from iron.  Hemoglobin in April, 2015 was 10.9 MCV 74.7.    Past Medical History  Diagnosis Date  . Allergy   . Asthma   . GERD (gastroesophageal reflux disease)   . Hyperlipidemia   . Hypertension   . Arthritis   . Anemia 2011    Since last visit she has had a Gi evaluation for newer onset Iron deficiiency anemia that was felt secondary to avm in duodenum rx with laser ablation.  . Cancer     rt breast   Past Surgical History  Procedure Laterality Date  . Partial hip arthroplasty      rt  . Laryngectomy    . Breast lumpectomy  04/26/2010    right   family history includes Cancer in her mother. Current Outpatient Prescriptions  Medication Sig Dispense Refill  . amLODipine (NORVASC) 5 MG tablet TAKE 1 TABLET TWICE A DAY  180 tablet  3  . budesonide-formoterol (SYMBICORT) 160-4.5 MCG/ACT inhaler Inhale 2 puffs into the lungs as needed.        Marland Kitchen lisinopril-hydrochlorothiazide (PRINZIDE,ZESTORETIC) 20-12.5 MG per tablet Take 2 tablets by mouth daily.  180 tablet  3  . olopatadine (PATANOL) 0.1 % ophthalmic solution Place 1 drop into both eyes as needed.       Marland Kitchen omeprazole (PRILOSEC) 20 MG capsule Take 1 capsule (20 mg total) by mouth daily.  90 capsule  3  . potassium chloride SA (K-DUR,KLOR-CON) 20  MEQ tablet Take 1 tablet (20 mEq total) by mouth 2 (two) times daily.  180 tablet  3  . simvastatin (ZOCOR) 20 MG tablet Take 1 tablet (20 mg total) by mouth at bedtime.  90 tablet  3  . solifenacin (VESICARE) 5 MG tablet Take 1 tablet (5 mg total) by mouth daily.  90 tablet  3  . [DISCONTINUED] solifenacin (VESICARE) 5 MG tablet Take 1 tablet (5 mg total) by mouth daily.  30 tablet  0   No current facility-administered medications for this visit.   Allergies as of 08/25/2013  . (No Known Allergies)    reports that she has quit smoking. She does not have any smokeless tobacco history on file. She reports that she does not drink alcohol or use illicit drugs.     Review of Systems: Pertinent positive and negative review of systems were  noted in the above HPI section. All other review of systems were otherwise negative.  Vital signs were reviewed in today's medical record Physical Exam: General: Well developed , well nourished, no acute distress Skin: anicteric Head: Normocephalic and atraumatic Eyes:  sclerae anicteric, EOMI Ears: Normal auditory acuity Mouth: No deformity or lesions Neck: Supple, no masses or thyromegaly Lungs: Clear throughout to auscultation Heart: Regular rate and rhythm; no murmurs, rubs or bruits Abdomen: Soft, non tender and non distended. No masses, hepatosplenomegaly or hernias noted. Normal Bowel sounds Rectal:deferred Musculoskeletal: Symmetrical with no gross deformities  Skin: No lesions on visible extremities Pulses:  Normal pulses noted Extremities: No clubbing, cyanosis,  or deformities noted.  There is 1+ ankle edema Neurological: Alert oriented x 4, grossly nonfocal Cervical Nodes:  No significant cervical adenopathy Inguinal Nodes: No significant inguinal adenopathy Psychological:  Alert and cooperative. Normal mood and affect  See Assessment and Plan under Problem List

## 2013-08-25 NOTE — Patient Instructions (Signed)
Your Colonoscopy is scheduled at [facility] Separate instructions have been given

## 2013-08-25 NOTE — Assessment & Plan Note (Signed)
Status post ablation of small bowel AVMs in 2010

## 2013-08-25 NOTE — Assessment & Plan Note (Signed)
Iron deficiency anemia is probably related to chronic GI blood loss secondary to AVMs.  Small bowel AVMs were treated in 2010.  Colonic bleeding from AVMs and, less likely polyps or neoplasm, are possibilities.  I think it is worthwhile repeating colonoscopy and treat any lesions.  Should that exam be normal I would continue iron supplementation and not pursue any further provided that [name] maintains her blood counts.

## 2013-08-25 NOTE — Addendum Note (Signed)
Addended by: Oda Kilts on: 08/25/2013 09:24 AM   Modules accepted: Orders

## 2013-08-25 NOTE — Addendum Note (Signed)
Addended by: Oda Kilts on: 08/25/2013 09:20 AM   Modules accepted: Orders

## 2013-08-26 ENCOUNTER — Telehealth: Payer: Self-pay | Admitting: Gastroenterology

## 2013-08-27 NOTE — Telephone Encounter (Signed)
Patient needs a Sept appointment , will look at Dr Guillermina [place] schedule for Sep and call daughter back

## 2013-08-28 ENCOUNTER — Encounter (HOSPITAL_COMMUNITY): Payer: Self-pay | Admitting: *Deleted

## 2013-08-28 ENCOUNTER — Telehealth: Payer: Self-pay | Admitting: Gastroenterology

## 2013-08-28 NOTE — Telephone Encounter (Signed)
See other phone note

## 2013-08-28 NOTE — Telephone Encounter (Signed)
Patient rescheduled for colon with APC on 7/13    Put patient back on at her reg schedule time

## 2013-08-31 ENCOUNTER — Encounter (INDEPENDENT_AMBULATORY_CARE_PROVIDER_SITE_OTHER): Payer: Self-pay | Admitting: General Surgery

## 2013-08-31 ENCOUNTER — Ambulatory Visit (INDEPENDENT_AMBULATORY_CARE_PROVIDER_SITE_OTHER): Payer: Medicare Other | Admitting: General Surgery

## 2013-08-31 VITALS — BP 126/74 | HR 71 | Temp 98.0°F | Ht 60.0 in | Wt 158.0 lb

## 2013-08-31 DIAGNOSIS — J309 Allergic rhinitis, unspecified: Secondary | ICD-10-CM | POA: Diagnosis not present

## 2013-08-31 DIAGNOSIS — C50919 Malignant neoplasm of unspecified site of unspecified female breast: Secondary | ICD-10-CM

## 2013-08-31 DIAGNOSIS — C50911 Malignant neoplasm of unspecified site of right female breast: Secondary | ICD-10-CM

## 2013-08-31 NOTE — Progress Notes (Signed)
Subjective:     Patient ID: [name], female   DOB: November 24, 1930, 80 y.o.   MRN: [id number]  HPI The patient is an 80 year old black female who is 80 years status post right lumpectomy and negative sentinel node biopsy for a T1 B. N0 right breast cancer. She received radiation therapy but no chemotherapy or into estrogen therapy. She has been well since her last visit. She has no complaints today. Her last mammogram was in January that showed no evidence of malignancy  Review of Systems  Constitutional: Negative.   HENT: Negative.   Eyes: Negative.   Respiratory: Negative.   Cardiovascular: Negative.   Gastrointestinal: Negative.   Endocrine: Negative.   Genitourinary: Negative.   Musculoskeletal: Negative.   Skin: Negative.   Allergic/Immunologic: Negative.   Neurological: Negative.   Hematological: Negative.   Psychiatric/Behavioral: Negative.        Objective:   Physical Exam  Constitutional: She is oriented to person, place, and time. She appears well-developed and well-nourished.  HENT:  Head: Normocephalic and atraumatic.  Eyes: Conjunctivae and EOM are normal. Pupils are equal, round, and reactive to light.  Neck: Normal range of motion. Neck supple.  Cardiovascular: Normal rate, regular rhythm and normal heart sounds.   Pulmonary/Chest: Effort normal and breath sounds normal.  There is no palpable mass in either breast. There is no palpable axillary, supraclavicular, or cervical lymphadenopathy  Abdominal: Soft. Bowel sounds are normal.  Musculoskeletal: Normal range of motion.  Lymphadenopathy:    She has no cervical adenopathy.  Neurological: She is alert and oriented to person, place, and time.  Skin: Skin is warm and dry.  Psychiatric: She has a normal mood and affect. Her behavior is normal.       Assessment:     The patient is 3 years status post right lumpectomy for breast cancer     Plan:     At this point she will continue to do regular self  exams. I will plan to see her back in 6 months.

## 2013-08-31 NOTE — Patient Instructions (Signed)
Continue regular self exams  

## 2013-09-03 DIAGNOSIS — M549 Dorsalgia, unspecified: Secondary | ICD-10-CM | POA: Diagnosis not present

## 2013-09-03 DIAGNOSIS — M48061 Spinal stenosis, lumbar region without neurogenic claudication: Secondary | ICD-10-CM | POA: Diagnosis not present

## 2013-09-07 DIAGNOSIS — J309 Allergic rhinitis, unspecified: Secondary | ICD-10-CM | POA: Diagnosis not present

## 2013-09-14 ENCOUNTER — Encounter (HOSPITAL_COMMUNITY)
Admission: RE | Disposition: A | Discharge status: Home / Self Care | Payer: Self-pay | Source: Ambulatory Visit | Attending: Gastroenterology

## 2013-09-14 ENCOUNTER — Ambulatory Visit (HOSPITAL_COMMUNITY): Payer: Medicare Other | Admitting: Certified Registered Nurse Anesthetist

## 2013-09-14 ENCOUNTER — Ambulatory Visit (HOSPITAL_COMMUNITY)
Admission: RE | Admit: 2013-09-14 | Discharge: 2013-09-14 | Disposition: A | Discharge status: Home / Self Care | Payer: Medicare Other | Source: Ambulatory Visit | Attending: Gastroenterology | Admitting: Gastroenterology

## 2013-09-14 ENCOUNTER — Encounter (HOSPITAL_COMMUNITY): Payer: Medicare Other | Admitting: Certified Registered Nurse Anesthetist

## 2013-09-14 ENCOUNTER — Encounter (HOSPITAL_COMMUNITY): Payer: Self-pay

## 2013-09-14 ENCOUNTER — Ambulatory Visit (HOSPITAL_COMMUNITY): Admit: 2013-09-14 | Payer: Medicare Other | Admitting: Gastroenterology

## 2013-09-14 DIAGNOSIS — D509 Iron deficiency anemia, unspecified: Secondary | ICD-10-CM | POA: Diagnosis not present

## 2013-09-14 DIAGNOSIS — Z853 Personal history of malignant neoplasm of breast: Secondary | ICD-10-CM | POA: Diagnosis not present

## 2013-09-14 DIAGNOSIS — E785 Hyperlipidemia, unspecified: Secondary | ICD-10-CM | POA: Insufficient documentation

## 2013-09-14 DIAGNOSIS — Z96649 Presence of unspecified artificial hip joint: Secondary | ICD-10-CM | POA: Insufficient documentation

## 2013-09-14 DIAGNOSIS — J45909 Unspecified asthma, uncomplicated: Secondary | ICD-10-CM | POA: Insufficient documentation

## 2013-09-14 DIAGNOSIS — Q2733 Arteriovenous malformation of digestive system vessel: Secondary | ICD-10-CM

## 2013-09-14 DIAGNOSIS — K5520 Angiodysplasia of colon without hemorrhage: Secondary | ICD-10-CM | POA: Diagnosis not present

## 2013-09-14 DIAGNOSIS — K219 Gastro-esophageal reflux disease without esophagitis: Secondary | ICD-10-CM | POA: Diagnosis not present

## 2013-09-14 DIAGNOSIS — K5521 Angiodysplasia of colon with hemorrhage: Secondary | ICD-10-CM

## 2013-09-14 DIAGNOSIS — K5730 Diverticulosis of large intestine without perforation or abscess without bleeding: Secondary | ICD-10-CM | POA: Insufficient documentation

## 2013-09-14 DIAGNOSIS — E0590 Thyrotoxicosis, unspecified without thyrotoxic crisis or storm: Secondary | ICD-10-CM | POA: Diagnosis not present

## 2013-09-14 DIAGNOSIS — Z87891 Personal history of nicotine dependence: Secondary | ICD-10-CM | POA: Insufficient documentation

## 2013-09-14 DIAGNOSIS — I10 Essential (primary) hypertension: Secondary | ICD-10-CM | POA: Insufficient documentation

## 2013-09-14 HISTORY — PX: HOT HEMOSTASIS: SHX5433

## 2013-09-14 HISTORY — PX: COLONOSCOPY WITH PROPOFOL: SHX5780

## 2013-09-14 SURGERY — COLONOSCOPY WITH PROPOFOL
Anesthesia: Monitor Anesthesia Care

## 2013-09-14 MED ORDER — ONDANSETRON HCL 4 MG/2ML IJ SOLN
INTRAMUSCULAR | Status: DC | PRN
  Administered 2013-09-14: 4 mg via INTRAVENOUS

## 2013-09-14 MED ORDER — KETAMINE HCL 10 MG/ML IJ SOLN
INTRAMUSCULAR | Status: DC | PRN
  Administered 2013-09-14: 20 mg via INTRAVENOUS

## 2013-09-14 MED ORDER — PROPOFOL 10 MG/ML IV BOLUS
INTRAVENOUS | Status: AC
  Filled 2013-09-14: qty 20

## 2013-09-14 MED ORDER — MIDAZOLAM HCL 2 MG/2ML IJ SOLN
INTRAMUSCULAR | Status: AC
  Filled 2013-09-14: qty 2

## 2013-09-14 MED ORDER — ONDANSETRON HCL 4 MG/2ML IJ SOLN
INTRAMUSCULAR | Status: AC
  Filled 2013-09-14: qty 2

## 2013-09-14 MED ORDER — SODIUM CHLORIDE 0.9 % IV SOLN: INTRAVENOUS | Status: DC

## 2013-09-14 MED ORDER — LIDOCAINE HCL (CARDIAC) 20 MG/ML IV SOLN
INTRAVENOUS | Status: DC | PRN
  Administered 2013-09-14: 100 mg via INTRAVENOUS

## 2013-09-14 MED ORDER — LIDOCAINE HCL (CARDIAC) 20 MG/ML IV SOLN
INTRAVENOUS | Status: AC
  Filled 2013-09-14: qty 5

## 2013-09-14 MED ORDER — MIDAZOLAM HCL 5 MG/5ML IJ SOLN
INTRAMUSCULAR | Status: DC | PRN
  Administered 2013-09-14: 2 mg via INTRAVENOUS

## 2013-09-14 MED ORDER — LACTATED RINGERS IV SOLN
INTRAVENOUS | Status: DC
  Administered 2013-09-14: 10:00:00 via INTRAVENOUS

## 2013-09-14 MED ORDER — PROPOFOL INFUSION 10 MG/ML OPTIME
INTRAVENOUS | Status: DC | PRN
  Administered 2013-09-14: 100 ug/kg/min via INTRAVENOUS

## 2013-09-14 SURGICAL SUPPLY — 21 items

## 2013-09-14 NOTE — Op Note (Addendum)
[facility] [place], [id number]   COLONOSCOPY PROCEDURE REPORT  PATIENT: Heather, [name]  MR#: [id number] BIRTHDATE: December 11, 1930 , 83  yrs. old GENDER: Female ENDOSCOPIST: Inda Castle, MD REFERRED BY: PROCEDURE DATE:  09/14/2013 PROCEDURE:   Colonoscopy with tissue ablation ASA CLASS:   Class II INDICATIONS:Iron Deficiency Anemia, h/o duodenal AVMs MEDICATIONS: MAC sedation, administered by CRNA  DESCRIPTION OF PROCEDURE:   After the risks benefits and alternatives of the procedure were thoroughly explained, informed consent was obtained.  A digital rectal exam revealed no abnormalities of the rectum.   The     endoscope was introduced through the anus and advanced to the cecum, which was identified by both the appendix and ileocecal valve. No adverse events experienced.   The quality of the prep was Suprep good  The instrument was then slowly withdrawn as the colon was fully examined.      COLON FINDINGS: A single AVM was identified in the cecum measuring about 3 mm.  This was obliterated utilizing the argon plasma coagulator.Diverticulosis was noted.   Moderate diverticulosis was noted in the descending colon.   There was moderate scattered diverticulosis noted in the transverse colon and ascending colon. The colon was otherwise normal.  There was no diverticulosis, inflammation, polyps or cancers unless previously stated. Retroflexed views revealed no abnormalities. The time to cecum=  . Withdrawal time=9 minutes 0 seconds.  The scope was withdrawn and the procedure completed. COMPLICATIONS: There were no complications.  ENDOSCOPIC IMPRESSION: 1.  cecal AVM-status post tissue obliteration with APC 2.  diverticulosis  RECOMMENDATIONS: 1.  Continue iron supplementation 2.  CBC 6 weeks; if hemoglobin continues to drop will proceed with repeat enteroscopy to look for recurrent AVMs  eSigned:  Inda Castle, MD 09/14/2013 10:59  AM   cc: Burnis Medin, MD

## 2013-09-14 NOTE — Transfer of Care (Signed)
Immediate Anesthesia Transfer of Care Note  Patient: [name]  Procedure(s) Performed: Procedure(s) (LRB): COLONOSCOPY WITH PROPOFOL (N/A) HOT HEMOSTASIS (ARGON PLASMA COAGULATION/BICAP) (N/A)  Patient Location: PACU  Anesthesia Type: MAC  Level of Consciousness: sedated, patient cooperative and responds to stimulation  Airway & Oxygen Therapy: Patient Spontanous Breathing and Patient connected to face mask oxgen  Post-op Assessment: Report given to PACU RN and Post -op Vital signs reviewed and stable  Post vital signs: Reviewed and stable  Complications: No apparent anesthesia complications

## 2013-09-14 NOTE — Discharge Instructions (Signed)

## 2013-09-14 NOTE — Anesthesia Postprocedure Evaluation (Signed)
  Anesthesia Post-op Note  Patient: [name]  Procedure(s) Performed: Procedure(s) (LRB): COLONOSCOPY WITH PROPOFOL (N/A) HOT HEMOSTASIS (ARGON PLASMA COAGULATION/BICAP) (N/A)  Patient Location: PACU  Anesthesia Type: MAC  Level of Consciousness: awake and alert   Airway and Oxygen Therapy: Patient Spontanous Breathing  Post-op Pain: mild  Post-op Assessment: Post-op Vital signs reviewed, Patient's Cardiovascular Status Stable, Respiratory Function Stable, Patent Airway and No signs of Nausea or vomiting  Last Vitals:  Filed Vitals:   09/14/13 1130  BP: 169/72  Pulse: 61  Temp: 36.3 C  Resp: 14    Post-op Vital Signs: stable   Complications: No apparent anesthesia complications

## 2013-09-14 NOTE — Anesthesia Preprocedure Evaluation (Addendum)
Anesthesia Evaluation  Patient identified by MRN, date of birth, ID band Patient awake    Reviewed: Allergy & Precautions, H&P , NPO status , Patient's Chart, lab work & pertinent test results  Airway Mallampati: II TM Distance: >3 FB Neck ROM: full    Dental  (+) Caps, Dental Advisory Given All upper front teeth are capped:   Pulmonary shortness of breath and with exertion, asthma , former smoker,  breath sounds clear to auscultation  Pulmonary exam normal       Cardiovascular Exercise Tolerance: Good hypertension, Pt. on medications Rhythm:regular Rate:Normal     Neuro/Psych negative neurological ROS  negative psych ROS   GI/Hepatic negative GI ROS, Neg liver ROS, GERD-  Medicated and Controlled,Fatty liver   Endo/Other  negative endocrine ROSHyperthyroidism Subclinical hyperthyroidism  Renal/GU negative Renal ROS  negative genitourinary   Musculoskeletal   Abdominal   Peds  Hematology negative hematology ROS (+)   Anesthesia Other Findings   Reproductive/Obstetrics negative OB ROS                         Anesthesia Physical Anesthesia Plan  ASA: III  Anesthesia Plan: MAC   Post-op Pain Management:    Induction:   Airway Management Planned:   Additional Equipment:   Intra-op Plan:   Post-operative Plan:   Informed Consent: I have reviewed the patients History and Physical, chart, labs and discussed the procedure including the risks, benefits and alternatives for the proposed anesthesia with the patient or authorized representative who has indicated his/her understanding and acceptance.   Dental Advisory Given  Plan Discussed with: CRNA and Surgeon  Anesthesia Plan Comments:         Anesthesia Quick Evaluation

## 2013-09-15 ENCOUNTER — Encounter (HOSPITAL_COMMUNITY): Payer: Self-pay | Admitting: Gastroenterology

## 2013-09-15 NOTE — H&P (View-Only) (Signed)
_                                                                                                                History of Present Illness: Pleasant 80 year old Afro-American female referred for evaluation of anemia.  Patient has a history of an iron deficiency anemia for which she underwent workup in 2010.  Screening colonoscopy in 2008 demonstrated diverticulosis.  An iron deficiency in 2010 prompted evaluation by upper endoscopy and capsule endoscopy.  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  The patient takes supplementary iron.  She has no GI complaints including abdominal pain, change in bowel habits, melena or hematochezia.  Stools are dark from iron.  Hemoglobin in April, 2015 was 10.9 MCV 74.7.    Past Medical History  Diagnosis Date  . Allergy   . Asthma   . GERD (gastroesophageal reflux disease)   . Hyperlipidemia   . Hypertension   . Arthritis   . Anemia 2011    Since last visit she has had a Gi evaluation for newer onset Iron deficiiency anemia that was felt secondary to avm in duodenum rx with laser ablation.  . Cancer     rt breast   Past Surgical History  Procedure Laterality Date  . Partial hip arthroplasty      rt  . Laryngectomy    . Breast lumpectomy  04/26/2010    right   family history includes Cancer in her mother. Current Outpatient Prescriptions  Medication Sig Dispense Refill  . amLODipine (NORVASC) 5 MG tablet TAKE 1 TABLET TWICE A DAY  180 tablet  3  . budesonide-formoterol (SYMBICORT) 160-4.5 MCG/ACT inhaler Inhale 2 puffs into the lungs as needed.        Marland Kitchen lisinopril-hydrochlorothiazide (PRINZIDE,ZESTORETIC) 20-12.5 MG per tablet Take 2 tablets by mouth daily.  180 tablet  3  . olopatadine (PATANOL) 0.1 % ophthalmic solution Place 1 drop into both eyes as needed.       Marland Kitchen omeprazole (PRILOSEC) 20 MG capsule Take 1 capsule (20 mg total) by mouth daily.  90 capsule  3  . potassium chloride SA (K-DUR,KLOR-CON) 20  MEQ tablet Take 1 tablet (20 mEq total) by mouth 2 (two) times daily.  180 tablet  3  . simvastatin (ZOCOR) 20 MG tablet Take 1 tablet (20 mg total) by mouth at bedtime.  90 tablet  3  . solifenacin (VESICARE) 5 MG tablet Take 1 tablet (5 mg total) by mouth daily.  90 tablet  3  . [DISCONTINUED] solifenacin (VESICARE) 5 MG tablet Take 1 tablet (5 mg total) by mouth daily.  30 tablet  0   No current facility-administered medications for this visit.   Allergies as of 08/25/2013  . (No Known Allergies)    reports that she has quit smoking. She does not have any smokeless tobacco history on file. She reports that she does not drink alcohol or use illicit drugs.     Review of Systems: Pertinent positive and negative review of systems were  noted in the above HPI section. All other review of systems were otherwise negative.  Vital signs were reviewed in today's medical record Physical Exam: General: Well developed , well nourished, no acute distress Skin: anicteric Head: Normocephalic and atraumatic Eyes:  sclerae anicteric, EOMI Ears: Normal auditory acuity Mouth: No deformity or lesions Neck: Supple, no masses or thyromegaly Lungs: Clear throughout to auscultation Heart: Regular rate and rhythm; no murmurs, rubs or bruits Abdomen: Soft, non tender and non distended. No masses, hepatosplenomegaly or hernias noted. Normal Bowel sounds Rectal:deferred Musculoskeletal: Symmetrical with no gross deformities  Skin: No lesions on visible extremities Pulses:  Normal pulses noted Extremities: No clubbing, cyanosis,  or deformities noted.  There is 1+ ankle edema Neurological: Alert oriented x 4, grossly nonfocal Cervical Nodes:  No significant cervical adenopathy Inguinal Nodes: No significant inguinal adenopathy Psychological:  Alert and cooperative. Normal mood and affect  See Assessment and Plan under Problem List

## 2013-09-15 NOTE — Interval H&P Note (Signed)
History and Physical Interval Note:  09/15/2013 5:00 PM  Natash B Pharris  has presented today for surgery, with the diagnosis of Colon polyps [211.3]  The various methods of treatment have been discussed with the patient and family. After consideration of risks, benefits and other options for treatment, the patient has consented to  Procedure(s): COLONOSCOPY WITH PROPOFOL (N/A) HOT HEMOSTASIS (ARGON PLASMA COAGULATION/BICAP) (N/A) as a surgical intervention .  The patient's history has been reviewed, patient examined, no change in status, stable for surgery.  I have reviewed the patient's chart and labs.  Questions were answered to the patient's satisfaction.    The recent H&P (dated *08/25/13**) was reviewed, the patient was examined and there is no change in the patients condition since that H&P was completed.   Erskine Emery  09/15/2013, 5:00 PM    Erskine Emery

## 2013-09-16 DIAGNOSIS — J309 Allergic rhinitis, unspecified: Secondary | ICD-10-CM | POA: Diagnosis not present

## 2013-09-21 DIAGNOSIS — J309 Allergic rhinitis, unspecified: Secondary | ICD-10-CM | POA: Diagnosis not present

## 2013-09-22 ENCOUNTER — Other Ambulatory Visit: Payer: Self-pay

## 2013-09-22 DIAGNOSIS — D500 Iron deficiency anemia secondary to blood loss (chronic): Secondary | ICD-10-CM

## 2013-09-29 DIAGNOSIS — J309 Allergic rhinitis, unspecified: Secondary | ICD-10-CM | POA: Diagnosis not present

## 2013-10-06 DIAGNOSIS — J309 Allergic rhinitis, unspecified: Secondary | ICD-10-CM | POA: Diagnosis not present

## 2013-10-13 DIAGNOSIS — J309 Allergic rhinitis, unspecified: Secondary | ICD-10-CM | POA: Diagnosis not present

## 2013-10-21 DIAGNOSIS — J309 Allergic rhinitis, unspecified: Secondary | ICD-10-CM | POA: Diagnosis not present

## 2013-10-28 DIAGNOSIS — J309 Allergic rhinitis, unspecified: Secondary | ICD-10-CM | POA: Diagnosis not present

## 2013-11-02 DIAGNOSIS — Z01419 Encounter for gynecological examination (general) (routine) without abnormal findings: Secondary | ICD-10-CM | POA: Diagnosis not present

## 2013-11-02 DIAGNOSIS — Z0000 Encounter for general adult medical examination without abnormal findings: Secondary | ICD-10-CM | POA: Diagnosis not present

## 2013-11-02 DIAGNOSIS — Z124 Encounter for screening for malignant neoplasm of cervix: Secondary | ICD-10-CM | POA: Diagnosis not present

## 2013-11-02 LAB — HM PAP SMEAR: HM Pap smear: NEGATIVE

## 2013-11-04 ENCOUNTER — Telehealth: Payer: Self-pay

## 2013-11-04 DIAGNOSIS — J309 Allergic rhinitis, unspecified: Secondary | ICD-10-CM | POA: Diagnosis not present

## 2013-11-04 NOTE — Telephone Encounter (Signed)
Message copied by Algernon Huxley on Wed Nov 04, 2013  3:20 PM ------      Message from: Braxtin Bamba, [place] R      Created: Tue Sep 22, 2013  1:45 PM      Regarding: CBC       Needs CBC In 6weeks, order in ------

## 2013-11-04 NOTE — Telephone Encounter (Signed)
Pt aware.

## 2013-11-05 ENCOUNTER — Other Ambulatory Visit (INDEPENDENT_AMBULATORY_CARE_PROVIDER_SITE_OTHER): Payer: Medicare Other

## 2013-11-05 DIAGNOSIS — D500 Iron deficiency anemia secondary to blood loss (chronic): Secondary | ICD-10-CM

## 2013-11-05 LAB — CBC WITH DIFFERENTIAL/PLATELET
Basophils Absolute: 0 10*3/uL (ref 0.0–0.1)
Basophils Relative: 0.4 % (ref 0.0–3.0)
Eosinophils Absolute: 0.3 10*3/uL (ref 0.0–0.7)
Eosinophils Relative: 2.8 % (ref 0.0–5.0)
HCT: 40.5 % (ref 36.0–46.0)
Hemoglobin: 13.1 g/dL (ref 12.0–15.0)
Lymphocytes Relative: 14.4 % (ref 12.0–46.0)
Lymphs Abs: 1.4 10*3/uL (ref 0.7–4.0)
MCHC: 32.4 g/dL (ref 30.0–36.0)
MCV: 84 fl (ref 78.0–100.0)
Monocytes Absolute: 0.6 10*3/uL (ref 0.1–1.0)
Monocytes Relative: 6.1 % (ref 3.0–12.0)
Neutro Abs: 7.3 10*3/uL (ref 1.4–7.7)
Neutrophils Relative %: 76.3 % (ref 43.0–77.0)
Platelets: 311 10*3/uL (ref 150.0–400.0)
RBC: 4.82 Mil/uL (ref 3.87–5.11)
RDW: 15.7 % — ABNORMAL HIGH (ref 11.5–15.5)
WBC: 9.6 10*3/uL (ref 4.0–10.5)

## 2013-11-06 ENCOUNTER — Encounter: Payer: Self-pay | Admitting: Internal Medicine

## 2013-11-06 NOTE — Progress Notes (Signed)
Quick Note:  Please inform the patient that lab work was normal and to continue current plan of action ______ 

## 2013-11-11 DIAGNOSIS — J309 Allergic rhinitis, unspecified: Secondary | ICD-10-CM | POA: Diagnosis not present

## 2013-11-18 DIAGNOSIS — J309 Allergic rhinitis, unspecified: Secondary | ICD-10-CM | POA: Diagnosis not present

## 2013-11-20 ENCOUNTER — Encounter: Payer: Self-pay | Admitting: Gastroenterology

## 2013-11-21 DIAGNOSIS — Z23 Encounter for immunization: Secondary | ICD-10-CM | POA: Diagnosis not present

## 2013-11-24 DIAGNOSIS — J309 Allergic rhinitis, unspecified: Secondary | ICD-10-CM | POA: Diagnosis not present

## 2013-11-25 DIAGNOSIS — J309 Allergic rhinitis, unspecified: Secondary | ICD-10-CM | POA: Diagnosis not present

## 2013-12-01 DIAGNOSIS — J309 Allergic rhinitis, unspecified: Secondary | ICD-10-CM | POA: Diagnosis not present

## 2013-12-08 DIAGNOSIS — J3089 Other allergic rhinitis: Secondary | ICD-10-CM | POA: Diagnosis not present

## 2013-12-08 DIAGNOSIS — J3081 Allergic rhinitis due to animal (cat) (dog) hair and dander: Secondary | ICD-10-CM | POA: Diagnosis not present

## 2013-12-08 DIAGNOSIS — J301 Allergic rhinitis due to pollen: Secondary | ICD-10-CM | POA: Diagnosis not present

## 2013-12-16 DIAGNOSIS — J3081 Allergic rhinitis due to animal (cat) (dog) hair and dander: Secondary | ICD-10-CM | POA: Diagnosis not present

## 2013-12-16 DIAGNOSIS — J3089 Other allergic rhinitis: Secondary | ICD-10-CM | POA: Diagnosis not present

## 2013-12-16 DIAGNOSIS — J301 Allergic rhinitis due to pollen: Secondary | ICD-10-CM | POA: Diagnosis not present

## 2013-12-21 DIAGNOSIS — J3081 Allergic rhinitis due to animal (cat) (dog) hair and dander: Secondary | ICD-10-CM | POA: Diagnosis not present

## 2013-12-21 DIAGNOSIS — J3089 Other allergic rhinitis: Secondary | ICD-10-CM | POA: Diagnosis not present

## 2013-12-21 DIAGNOSIS — J301 Allergic rhinitis due to pollen: Secondary | ICD-10-CM | POA: Diagnosis not present

## 2013-12-23 DIAGNOSIS — J3081 Allergic rhinitis due to animal (cat) (dog) hair and dander: Secondary | ICD-10-CM | POA: Diagnosis not present

## 2013-12-23 DIAGNOSIS — J3089 Other allergic rhinitis: Secondary | ICD-10-CM | POA: Diagnosis not present

## 2013-12-28 DIAGNOSIS — J3081 Allergic rhinitis due to animal (cat) (dog) hair and dander: Secondary | ICD-10-CM | POA: Diagnosis not present

## 2013-12-28 DIAGNOSIS — J3089 Other allergic rhinitis: Secondary | ICD-10-CM | POA: Diagnosis not present

## 2013-12-28 DIAGNOSIS — J301 Allergic rhinitis due to pollen: Secondary | ICD-10-CM | POA: Diagnosis not present

## 2014-01-04 DIAGNOSIS — J3089 Other allergic rhinitis: Secondary | ICD-10-CM | POA: Diagnosis not present

## 2014-01-04 DIAGNOSIS — J301 Allergic rhinitis due to pollen: Secondary | ICD-10-CM | POA: Diagnosis not present

## 2014-01-04 DIAGNOSIS — J3081 Allergic rhinitis due to animal (cat) (dog) hair and dander: Secondary | ICD-10-CM | POA: Diagnosis not present

## 2014-01-06 ENCOUNTER — Telehealth: Payer: Self-pay | Admitting: Internal Medicine

## 2014-01-06 NOTE — Telephone Encounter (Signed)
This was filled for 1 year on 06/12/13.  Too early for refills.

## 2014-01-06 NOTE — Telephone Encounter (Signed)
[facility] is requesting re-fill on amLODipine (NORVASC) 5 MG tablet

## 2014-01-07 MED ORDER — AMLODIPINE BESYLATE 10 MG PO TABS: 10.0000 mg | ORAL_TABLET | Freq: Every day | ORAL | Status: DC

## 2014-01-07 NOTE — Telephone Encounter (Signed)
Ok to change

## 2014-01-07 NOTE — Telephone Encounter (Signed)
Ok to change to 10 mg per day    Enough ot get  Her to her yearly visit  ? 6 months woth

## 2014-01-07 NOTE — Telephone Encounter (Signed)
New rx has been sent to the pharmacy

## 2014-01-07 NOTE — Telephone Encounter (Signed)
Pharm called  bc pt is taking 5 mg 2 x day.  Would like to change to 10 mg one time /day . Fax was sent. But pls call [phone] and   Reference [id number]

## 2014-01-11 DIAGNOSIS — J301 Allergic rhinitis due to pollen: Secondary | ICD-10-CM | POA: Diagnosis not present

## 2014-01-11 DIAGNOSIS — J3089 Other allergic rhinitis: Secondary | ICD-10-CM | POA: Diagnosis not present

## 2014-01-11 DIAGNOSIS — J3081 Allergic rhinitis due to animal (cat) (dog) hair and dander: Secondary | ICD-10-CM | POA: Diagnosis not present

## 2014-01-18 DIAGNOSIS — J3081 Allergic rhinitis due to animal (cat) (dog) hair and dander: Secondary | ICD-10-CM | POA: Diagnosis not present

## 2014-01-18 DIAGNOSIS — J301 Allergic rhinitis due to pollen: Secondary | ICD-10-CM | POA: Diagnosis not present

## 2014-01-18 DIAGNOSIS — J3089 Other allergic rhinitis: Secondary | ICD-10-CM | POA: Diagnosis not present

## 2014-01-25 DIAGNOSIS — J3089 Other allergic rhinitis: Secondary | ICD-10-CM | POA: Diagnosis not present

## 2014-01-25 DIAGNOSIS — J3081 Allergic rhinitis due to animal (cat) (dog) hair and dander: Secondary | ICD-10-CM | POA: Diagnosis not present

## 2014-01-25 DIAGNOSIS — J301 Allergic rhinitis due to pollen: Secondary | ICD-10-CM | POA: Diagnosis not present

## 2014-02-01 DIAGNOSIS — J301 Allergic rhinitis due to pollen: Secondary | ICD-10-CM | POA: Diagnosis not present

## 2014-02-01 DIAGNOSIS — J3089 Other allergic rhinitis: Secondary | ICD-10-CM | POA: Diagnosis not present

## 2014-02-01 DIAGNOSIS — J3081 Allergic rhinitis due to animal (cat) (dog) hair and dander: Secondary | ICD-10-CM | POA: Diagnosis not present

## 2014-02-09 DIAGNOSIS — J3089 Other allergic rhinitis: Secondary | ICD-10-CM | POA: Diagnosis not present

## 2014-02-09 DIAGNOSIS — J3081 Allergic rhinitis due to animal (cat) (dog) hair and dander: Secondary | ICD-10-CM | POA: Diagnosis not present

## 2014-02-09 DIAGNOSIS — J301 Allergic rhinitis due to pollen: Secondary | ICD-10-CM | POA: Diagnosis not present

## 2014-02-16 DIAGNOSIS — J301 Allergic rhinitis due to pollen: Secondary | ICD-10-CM | POA: Diagnosis not present

## 2014-02-16 DIAGNOSIS — J4530 Mild persistent asthma, uncomplicated: Secondary | ICD-10-CM | POA: Diagnosis not present

## 2014-02-16 DIAGNOSIS — J3081 Allergic rhinitis due to animal (cat) (dog) hair and dander: Secondary | ICD-10-CM | POA: Diagnosis not present

## 2014-02-16 DIAGNOSIS — J3089 Other allergic rhinitis: Secondary | ICD-10-CM | POA: Diagnosis not present

## 2014-02-17 DIAGNOSIS — J301 Allergic rhinitis due to pollen: Secondary | ICD-10-CM | POA: Diagnosis not present

## 2014-02-23 DIAGNOSIS — J301 Allergic rhinitis due to pollen: Secondary | ICD-10-CM | POA: Diagnosis not present

## 2014-02-23 DIAGNOSIS — J3081 Allergic rhinitis due to animal (cat) (dog) hair and dander: Secondary | ICD-10-CM | POA: Diagnosis not present

## 2014-02-23 DIAGNOSIS — J3089 Other allergic rhinitis: Secondary | ICD-10-CM | POA: Diagnosis not present

## 2014-03-08 DIAGNOSIS — J3081 Allergic rhinitis due to animal (cat) (dog) hair and dander: Secondary | ICD-10-CM | POA: Diagnosis not present

## 2014-03-08 DIAGNOSIS — J3089 Other allergic rhinitis: Secondary | ICD-10-CM | POA: Diagnosis not present

## 2014-03-08 DIAGNOSIS — J301 Allergic rhinitis due to pollen: Secondary | ICD-10-CM | POA: Diagnosis not present

## 2014-03-09 ENCOUNTER — Other Ambulatory Visit: Payer: Self-pay

## 2014-03-09 ENCOUNTER — Telehealth: Payer: Self-pay

## 2014-03-09 DIAGNOSIS — D509 Iron deficiency anemia, unspecified: Secondary | ICD-10-CM

## 2014-03-09 NOTE — Telephone Encounter (Signed)
Pt aware.

## 2014-03-09 NOTE — Telephone Encounter (Signed)
-----   Message from Maury Dus, RN sent at 09/22/2013  1:41 PM EDT ----- Regarding: CBC Pt needs CBC in 6 weeks. Order in epic.

## 2014-03-15 ENCOUNTER — Other Ambulatory Visit (INDEPENDENT_AMBULATORY_CARE_PROVIDER_SITE_OTHER): Payer: Medicare Other

## 2014-03-15 DIAGNOSIS — J3089 Other allergic rhinitis: Secondary | ICD-10-CM | POA: Diagnosis not present

## 2014-03-15 DIAGNOSIS — J301 Allergic rhinitis due to pollen: Secondary | ICD-10-CM | POA: Diagnosis not present

## 2014-03-15 DIAGNOSIS — J3081 Allergic rhinitis due to animal (cat) (dog) hair and dander: Secondary | ICD-10-CM | POA: Diagnosis not present

## 2014-03-15 DIAGNOSIS — D509 Iron deficiency anemia, unspecified: Secondary | ICD-10-CM | POA: Diagnosis not present

## 2014-03-15 LAB — CBC WITH DIFFERENTIAL/PLATELET
Basophils Absolute: 0 10*3/uL (ref 0.0–0.1)
Basophils Relative: 0.2 % (ref 0.0–3.0)
Eosinophils Absolute: 0.2 10*3/uL (ref 0.0–0.7)
Eosinophils Relative: 2.6 % (ref 0.0–5.0)
HCT: 41.5 % (ref 36.0–46.0)
Hemoglobin: 13.3 g/dL (ref 12.0–15.0)
Lymphocytes Relative: 15.7 % (ref 12.0–46.0)
Lymphs Abs: 1.2 10*3/uL (ref 0.7–4.0)
MCHC: 32.1 g/dL (ref 30.0–36.0)
MCV: 85.1 fl (ref 78.0–100.0)
Monocytes Absolute: 0.6 10*3/uL (ref 0.1–1.0)
Monocytes Relative: 8.4 % (ref 3.0–12.0)
Neutro Abs: 5.6 10*3/uL (ref 1.4–7.7)
Neutrophils Relative %: 73.1 % (ref 43.0–77.0)
Platelets: 301 10*3/uL (ref 150.0–400.0)
RBC: 4.88 Mil/uL (ref 3.87–5.11)
RDW: 14.6 % (ref 11.5–15.5)
WBC: 7.7 10*3/uL (ref 4.0–10.5)

## 2014-03-16 ENCOUNTER — Other Ambulatory Visit: Payer: Self-pay | Admitting: General Surgery

## 2014-03-16 DIAGNOSIS — Z853 Personal history of malignant neoplasm of breast: Secondary | ICD-10-CM

## 2014-03-16 NOTE — Progress Notes (Signed)
Quick Note:  Please inform the patient that lab work was normal and to continue current plan of action ______ 

## 2014-03-23 DIAGNOSIS — J3089 Other allergic rhinitis: Secondary | ICD-10-CM | POA: Diagnosis not present

## 2014-03-23 DIAGNOSIS — J3081 Allergic rhinitis due to animal (cat) (dog) hair and dander: Secondary | ICD-10-CM | POA: Diagnosis not present

## 2014-03-23 DIAGNOSIS — J301 Allergic rhinitis due to pollen: Secondary | ICD-10-CM | POA: Diagnosis not present

## 2014-03-31 DIAGNOSIS — J3081 Allergic rhinitis due to animal (cat) (dog) hair and dander: Secondary | ICD-10-CM | POA: Diagnosis not present

## 2014-03-31 DIAGNOSIS — J3089 Other allergic rhinitis: Secondary | ICD-10-CM | POA: Diagnosis not present

## 2014-03-31 DIAGNOSIS — J301 Allergic rhinitis due to pollen: Secondary | ICD-10-CM | POA: Diagnosis not present

## 2014-04-05 ENCOUNTER — Ambulatory Visit
Admission: RE | Admit: 2014-04-05 | Discharge: 2014-04-05 | Disposition: A | Discharge status: Home / Self Care | Payer: Medicare Other | Source: Ambulatory Visit | Attending: General Surgery | Admitting: General Surgery

## 2014-04-05 DIAGNOSIS — Z853 Personal history of malignant neoplasm of breast: Secondary | ICD-10-CM | POA: Diagnosis not present

## 2014-04-05 DIAGNOSIS — R928 Other abnormal and inconclusive findings on diagnostic imaging of breast: Secondary | ICD-10-CM | POA: Diagnosis not present

## 2014-04-06 DIAGNOSIS — J3081 Allergic rhinitis due to animal (cat) (dog) hair and dander: Secondary | ICD-10-CM | POA: Diagnosis not present

## 2014-04-06 DIAGNOSIS — J301 Allergic rhinitis due to pollen: Secondary | ICD-10-CM | POA: Diagnosis not present

## 2014-04-06 DIAGNOSIS — J3089 Other allergic rhinitis: Secondary | ICD-10-CM | POA: Diagnosis not present

## 2014-04-09 DIAGNOSIS — J301 Allergic rhinitis due to pollen: Secondary | ICD-10-CM | POA: Diagnosis not present

## 2014-04-12 DIAGNOSIS — J3081 Allergic rhinitis due to animal (cat) (dog) hair and dander: Secondary | ICD-10-CM | POA: Diagnosis not present

## 2014-04-12 DIAGNOSIS — J301 Allergic rhinitis due to pollen: Secondary | ICD-10-CM | POA: Diagnosis not present

## 2014-04-12 DIAGNOSIS — J3089 Other allergic rhinitis: Secondary | ICD-10-CM | POA: Diagnosis not present

## 2014-04-16 DIAGNOSIS — J3089 Other allergic rhinitis: Secondary | ICD-10-CM | POA: Diagnosis not present

## 2014-04-16 DIAGNOSIS — J3081 Allergic rhinitis due to animal (cat) (dog) hair and dander: Secondary | ICD-10-CM | POA: Diagnosis not present

## 2014-04-16 DIAGNOSIS — J301 Allergic rhinitis due to pollen: Secondary | ICD-10-CM | POA: Diagnosis not present

## 2014-04-21 DIAGNOSIS — J301 Allergic rhinitis due to pollen: Secondary | ICD-10-CM | POA: Diagnosis not present

## 2014-04-21 DIAGNOSIS — J3089 Other allergic rhinitis: Secondary | ICD-10-CM | POA: Diagnosis not present

## 2014-04-21 DIAGNOSIS — J3081 Allergic rhinitis due to animal (cat) (dog) hair and dander: Secondary | ICD-10-CM | POA: Diagnosis not present

## 2014-05-05 DIAGNOSIS — J3081 Allergic rhinitis due to animal (cat) (dog) hair and dander: Secondary | ICD-10-CM | POA: Diagnosis not present

## 2014-05-05 DIAGNOSIS — J301 Allergic rhinitis due to pollen: Secondary | ICD-10-CM | POA: Diagnosis not present

## 2014-05-05 DIAGNOSIS — J3089 Other allergic rhinitis: Secondary | ICD-10-CM | POA: Diagnosis not present

## 2014-05-06 DIAGNOSIS — J3089 Other allergic rhinitis: Secondary | ICD-10-CM | POA: Diagnosis not present

## 2014-05-06 DIAGNOSIS — J3081 Allergic rhinitis due to animal (cat) (dog) hair and dander: Secondary | ICD-10-CM | POA: Diagnosis not present

## 2014-05-11 DIAGNOSIS — J301 Allergic rhinitis due to pollen: Secondary | ICD-10-CM | POA: Diagnosis not present

## 2014-05-11 DIAGNOSIS — J3089 Other allergic rhinitis: Secondary | ICD-10-CM | POA: Diagnosis not present

## 2014-05-11 DIAGNOSIS — J3081 Allergic rhinitis due to animal (cat) (dog) hair and dander: Secondary | ICD-10-CM | POA: Diagnosis not present

## 2014-05-18 DIAGNOSIS — C50911 Malignant neoplasm of unspecified site of right female breast: Secondary | ICD-10-CM | POA: Diagnosis not present

## 2014-05-18 DIAGNOSIS — J3089 Other allergic rhinitis: Secondary | ICD-10-CM | POA: Diagnosis not present

## 2014-05-18 DIAGNOSIS — J3081 Allergic rhinitis due to animal (cat) (dog) hair and dander: Secondary | ICD-10-CM | POA: Diagnosis not present

## 2014-05-18 DIAGNOSIS — J301 Allergic rhinitis due to pollen: Secondary | ICD-10-CM | POA: Diagnosis not present

## 2014-05-26 DIAGNOSIS — J3089 Other allergic rhinitis: Secondary | ICD-10-CM | POA: Diagnosis not present

## 2014-05-26 DIAGNOSIS — J301 Allergic rhinitis due to pollen: Secondary | ICD-10-CM | POA: Diagnosis not present

## 2014-05-26 DIAGNOSIS — J3081 Allergic rhinitis due to animal (cat) (dog) hair and dander: Secondary | ICD-10-CM | POA: Diagnosis not present

## 2014-06-02 DIAGNOSIS — J301 Allergic rhinitis due to pollen: Secondary | ICD-10-CM | POA: Diagnosis not present

## 2014-06-02 DIAGNOSIS — J3089 Other allergic rhinitis: Secondary | ICD-10-CM | POA: Diagnosis not present

## 2014-06-07 DIAGNOSIS — J3089 Other allergic rhinitis: Secondary | ICD-10-CM | POA: Diagnosis not present

## 2014-06-07 DIAGNOSIS — J301 Allergic rhinitis due to pollen: Secondary | ICD-10-CM | POA: Diagnosis not present

## 2014-06-07 DIAGNOSIS — J3081 Allergic rhinitis due to animal (cat) (dog) hair and dander: Secondary | ICD-10-CM | POA: Diagnosis not present

## 2014-06-14 DIAGNOSIS — J3089 Other allergic rhinitis: Secondary | ICD-10-CM | POA: Diagnosis not present

## 2014-06-14 DIAGNOSIS — J301 Allergic rhinitis due to pollen: Secondary | ICD-10-CM | POA: Diagnosis not present

## 2014-06-14 DIAGNOSIS — J3081 Allergic rhinitis due to animal (cat) (dog) hair and dander: Secondary | ICD-10-CM | POA: Diagnosis not present

## 2014-06-21 DIAGNOSIS — J301 Allergic rhinitis due to pollen: Secondary | ICD-10-CM | POA: Diagnosis not present

## 2014-06-21 DIAGNOSIS — J3081 Allergic rhinitis due to animal (cat) (dog) hair and dander: Secondary | ICD-10-CM | POA: Diagnosis not present

## 2014-06-21 DIAGNOSIS — J3089 Other allergic rhinitis: Secondary | ICD-10-CM | POA: Diagnosis not present

## 2014-06-28 DIAGNOSIS — J3081 Allergic rhinitis due to animal (cat) (dog) hair and dander: Secondary | ICD-10-CM | POA: Diagnosis not present

## 2014-06-28 DIAGNOSIS — J3089 Other allergic rhinitis: Secondary | ICD-10-CM | POA: Diagnosis not present

## 2014-06-28 DIAGNOSIS — J301 Allergic rhinitis due to pollen: Secondary | ICD-10-CM | POA: Diagnosis not present

## 2014-07-07 DIAGNOSIS — J3081 Allergic rhinitis due to animal (cat) (dog) hair and dander: Secondary | ICD-10-CM | POA: Diagnosis not present

## 2014-07-07 DIAGNOSIS — J3089 Other allergic rhinitis: Secondary | ICD-10-CM | POA: Diagnosis not present

## 2014-07-07 DIAGNOSIS — J301 Allergic rhinitis due to pollen: Secondary | ICD-10-CM | POA: Diagnosis not present

## 2014-07-13 DIAGNOSIS — J3089 Other allergic rhinitis: Secondary | ICD-10-CM | POA: Diagnosis not present

## 2014-07-13 DIAGNOSIS — J301 Allergic rhinitis due to pollen: Secondary | ICD-10-CM | POA: Diagnosis not present

## 2014-07-13 DIAGNOSIS — J3081 Allergic rhinitis due to animal (cat) (dog) hair and dander: Secondary | ICD-10-CM | POA: Diagnosis not present

## 2014-07-23 DIAGNOSIS — J3081 Allergic rhinitis due to animal (cat) (dog) hair and dander: Secondary | ICD-10-CM | POA: Diagnosis not present

## 2014-07-23 DIAGNOSIS — J301 Allergic rhinitis due to pollen: Secondary | ICD-10-CM | POA: Diagnosis not present

## 2014-07-23 DIAGNOSIS — J3089 Other allergic rhinitis: Secondary | ICD-10-CM | POA: Diagnosis not present

## 2014-07-28 DIAGNOSIS — J301 Allergic rhinitis due to pollen: Secondary | ICD-10-CM | POA: Diagnosis not present

## 2014-07-28 DIAGNOSIS — J3081 Allergic rhinitis due to animal (cat) (dog) hair and dander: Secondary | ICD-10-CM | POA: Diagnosis not present

## 2014-07-28 DIAGNOSIS — J3089 Other allergic rhinitis: Secondary | ICD-10-CM | POA: Diagnosis not present

## 2014-08-16 DIAGNOSIS — J3081 Allergic rhinitis due to animal (cat) (dog) hair and dander: Secondary | ICD-10-CM | POA: Diagnosis not present

## 2014-08-16 DIAGNOSIS — J301 Allergic rhinitis due to pollen: Secondary | ICD-10-CM | POA: Diagnosis not present

## 2014-08-16 DIAGNOSIS — J3089 Other allergic rhinitis: Secondary | ICD-10-CM | POA: Diagnosis not present

## 2014-08-18 DIAGNOSIS — J3089 Other allergic rhinitis: Secondary | ICD-10-CM | POA: Diagnosis not present

## 2014-08-18 DIAGNOSIS — J301 Allergic rhinitis due to pollen: Secondary | ICD-10-CM | POA: Diagnosis not present

## 2014-08-18 DIAGNOSIS — J3081 Allergic rhinitis due to animal (cat) (dog) hair and dander: Secondary | ICD-10-CM | POA: Diagnosis not present

## 2014-08-18 DIAGNOSIS — J4530 Mild persistent asthma, uncomplicated: Secondary | ICD-10-CM | POA: Diagnosis not present

## 2014-08-24 ENCOUNTER — Ambulatory Visit (INDEPENDENT_AMBULATORY_CARE_PROVIDER_SITE_OTHER): Payer: Medicare Other | Admitting: Internal Medicine

## 2014-08-24 VITALS — BP 136/84 | Temp 98.2°F | Ht 60.0 in | Wt 150.8 lb

## 2014-08-24 DIAGNOSIS — M2569 Stiffness of other specified joint, not elsewhere classified: Secondary | ICD-10-CM

## 2014-08-24 DIAGNOSIS — M2550 Pain in unspecified joint: Secondary | ICD-10-CM | POA: Diagnosis not present

## 2014-08-24 DIAGNOSIS — E785 Hyperlipidemia, unspecified: Secondary | ICD-10-CM | POA: Diagnosis not present

## 2014-08-24 DIAGNOSIS — I10 Essential (primary) hypertension: Secondary | ICD-10-CM | POA: Diagnosis not present

## 2014-08-24 DIAGNOSIS — J3089 Other allergic rhinitis: Secondary | ICD-10-CM | POA: Diagnosis not present

## 2014-08-24 DIAGNOSIS — M545 Low back pain: Secondary | ICD-10-CM

## 2014-08-24 DIAGNOSIS — Z853 Personal history of malignant neoplasm of breast: Secondary | ICD-10-CM

## 2014-08-24 DIAGNOSIS — M2560 Stiffness of unspecified joint, not elsewhere classified: Secondary | ICD-10-CM | POA: Diagnosis not present

## 2014-08-24 DIAGNOSIS — J301 Allergic rhinitis due to pollen: Secondary | ICD-10-CM | POA: Diagnosis not present

## 2014-08-24 DIAGNOSIS — Z79899 Other long term (current) drug therapy: Secondary | ICD-10-CM

## 2014-08-24 NOTE — Patient Instructions (Addendum)
Back stiffness I agree is probably from arthritis . Since  this  It has been going on  And progressing  . We can get an opinion frol rheumatology . Xray and lab work may be in order.  Antiinflammatory  Medications such as  voltaren aleve can help but   Can  Negatively effect your kidney function.   You are also  due for yearly blood tests monitoring  For your hypertension and cholesterol levels .  Etc .    Please make appt for fasting  Lab  I will put in order and then ROV     For f/u med check after that   .  You can try aleve 1-2  once a day for 1-2 weeks

## 2014-08-24 NOTE — Progress Notes (Signed)
Pre visit review using our clinic review tool, if applicable. No additional management support is needed unless otherwise documented below in the visit note.  Chief Complaint  Patient presents with  . Back Pain    Lower back.  Prevents her from an upright position while walking.    HPI: Patient [name]  comes in today for SDA for  new problem evaluation. Onset about 2 years ago of morning back stiffness that is slowly progressed but recently gotten much worse and affecting her lifestyle. She denies any falling. She feels like she is stiffness and has to walk bent over. She was in [place] for 2 weeks and is taking a [place] medicine that looks like multiple Therapist, sports on [employer]. States that it really helps some mornings and then she walks better. She denies hand stiffness fever point tenderness back pain or leg radiation. In the past Dr. Emiliano Dyer  gave her prednisone  ? Help temporarily .   Also has a concern because she is getting right arm muscle soreness she thinks may be related to allergy shots that she's been getting for a while from Dr. Donneta Romberg. But there is no local tenderness and it hasn't been addressed a something important. Denies any swelling fever shoulder dysfunction otherwise. Her blood pressures been okay she hasn't had any blood work done since evaluation with Dr. Deatra Ina. ROS: See pertinent positives and negatives per HPI. No current cp sob falling   Past Medical History  Diagnosis Date  . Allergy   . Asthma   . GERD (gastroesophageal reflux disease)   . Hyperlipidemia   . Hypertension   . Anemia 2011    Since last visit she has had a Gi evaluation for newer onset Iron deficiiency anemia that was felt secondary to avm in duodenum rx with laser ablation.  . Cancer     rt breast  . Arthritis     in back    Family History  Problem Relation Age of Onset  . Cancer Mother     breast and ovarian    History   Social History  . Marital Status: Widowed    Spouse  Name: N/A  . Number of Children: N/A  . Years of Education: N/A   Social History Main Topics  . Smoking status: Former Smoker -- 0.25 packs/day for 4 years    Types: Cigarettes    Quit date: 03/05/1968  . Smokeless tobacco: Never Used  . Alcohol Use: No  . Drug Use: No  . Sexual Activity: Not on file   Other Topics Concern  . Not on file   Social History Narrative   Widowed 2001    Ex smoker   HHof 1    Active in church and volunteer activities    [place] a few times per week   Swimming 2 x per week.    Tobacco 49=58 less than 1ppd.              Outpatient Prescriptions Prior to Visit  Medication Sig Dispense Refill  . amLODipine (NORVASC) 10 MG tablet Take 1 tablet (10 mg total) by mouth daily. 90 tablet 1  . budesonide-formoterol (SYMBICORT) 160-4.5 MCG/ACT inhaler Inhale 2 puffs into the lungs 2 (two) times daily as needed (Shortness of breath).     . cycloSPORINE (RESTASIS) 0.05 % ophthalmic emulsion Place 1 drop into both eyes 2 (two) times daily.    . ferrous sulfate 325 (65 FE) MG tablet Take 325 mg by mouth daily  with breakfast.    . lisinopril-hydrochlorothiazide (PRINZIDE,ZESTORETIC) 20-12.5 MG per tablet Take 1 tablet by mouth 2 (two) times daily.    . Magnesium 500 MG CAPS Take 500 mg by mouth daily.    . montelukast (SINGULAIR) 10 MG tablet Take 10 mg by mouth at bedtime.    Marland Kitchen omeprazole (PRILOSEC) 20 MG capsule Take 20 mg by mouth daily as needed (Acid Reflux).    . potassium chloride SA (K-DUR,KLOR-CON) 20 MEQ tablet Take 1 tablet (20 mEq total) by mouth 2 (two) times daily. 180 tablet 3  . simvastatin (ZOCOR) 20 MG tablet Take 1 tablet (20 mg total) by mouth at bedtime. 90 tablet 3  . solifenacin (VESICARE) 5 MG tablet Take 1 tablet (5 mg total) by mouth daily. 90 tablet 3  . amoxicillin (AMOXIL) 500 MG capsule Take 500 mg by mouth once. Prior to surgery or procedures only     No facility-administered medications prior to visit.     EXAM:  BP 136/84  mmHg  Temp(Src) 98.2 F (36.8 C) (Oral)  Ht 5' (1.524 m)  Wt 150 lb 12.8 oz (68.402 kg)  BMI 29.45 kg/m2  Body mass index is 29.45 kg/(m^2).  GENERAL: vitals reviewed and listed above, alert, oriented, appears well hydrated and in no acute distress tends to bend over when she stands up from the LS-spine has some kyphosis but then straightens up and walks appears to be steady. HEENT: atraumatic, conjunctiva  clear, no obvious abnormalities on inspection of external nose and ears NECK: no obvious masses on inspection palpation  LUNGS: clear to auscultation bilaterally, no wheezes, rales or rhonchi, good air movement CV: HRRR, no clubbing cyanosis  has +1 stable peripheral edema nl cap refill  MS: moves all extremities without noticeable focal  abnormality has arthritic changes in her hands but good range of motion. Back no point tenderness but points to the SI areas of difficulty toe inhale strength appear to be normal. Negative SLR PSYCH: pleasant and cooperative, no obvious depression or anxiety Lab Results  Component Value Date   WBC 7.7 03/15/2014   HGB 13.3 03/15/2014   HCT 41.5 03/15/2014   PLT 301.0 03/15/2014   GLUCOSE 86 06/12/2013   CHOL 195 06/12/2013   TRIG 100.0 06/12/2013   HDL 64.20 06/12/2013   LDLCALC 111* 06/12/2013   ALT 17 06/12/2013   AST 24 06/12/2013   NA 140 06/12/2013   K 3.6 06/12/2013   CL 102 06/12/2013   CREATININE 0.7 06/12/2013   BUN 13 06/12/2013   CO2 30 06/12/2013   TSH 0.48 06/12/2013    ASSESSMENT AND PLAN:  Discussed the following assessment and plan:  Back stiffness - Plan: Basic metabolic panel, CBC with Differential/Platelet, Hepatic function panel, Lipid panel, TSH, T4, free, C-reactive protein, Cyclic citrul peptide antibody, IgG, CK, Ferritin, POCT urinalysis dipstick, ANA, Ambulatory referral to Rheumatology  Essential hypertension - Plan: Basic metabolic panel, CBC with Differential/Platelet, Hepatic function panel, Lipid panel,  TSH, T4, free, C-reactive protein, Cyclic citrul peptide antibody, IgG, CK, Ferritin, POCT urinalysis dipstick, ANA  Multiple joint pain - mostly stiffness - Plan: Basic metabolic panel, CBC with Differential/Platelet, Hepatic function panel, Lipid panel, TSH, T4, free, C-reactive protein, Cyclic citrul peptide antibody, IgG, CK, Ferritin, POCT urinalysis dipstick, ANA, Ambulatory referral to Rheumatology  Hyperlipidemia - Plan: Basic metabolic panel, CBC with Differential/Platelet, Hepatic function panel, Lipid panel, TSH, T4, free, C-reactive protein, Cyclic citrul peptide antibody, IgG, CK, Ferritin, POCT urinalysis dipstick, ANA  Medication management -  Plan: Basic metabolic panel, CBC with Differential/Platelet, Hepatic function panel, Lipid panel, TSH, T4, free, C-reactive protein, Cyclic citrul peptide antibody, IgG, CK, Ferritin, POCT urinalysis dipstick, ANA  Low back pain, unspecified back pain laterality, with sciatica presence unspecified - Plan: Basic metabolic panel, CBC with Differential/Platelet, Hepatic function panel, Lipid panel, TSH, T4, free, C-reactive protein, Cyclic citrul peptide antibody, IgG, CK, Ferritin, POCT urinalysis dipstick, ANA, Ambulatory referral to Rheumatology  HX: breast cancer Chronic disease management  Over due for this  Plan fasting labs with   ana  Esr/crp ferritin cpk  lipids bmp etc.  Ccp.  UA  -Patient advised to return or notify health care team  if symptoms worsen ,persist or new concerns arise.  Patient Instructions  Back stiffness I agree is probably from arthritis . Since  this  It has been going on  And progressing  . We can get an opinion frol rheumatology . Xray and lab work may be in order.  Antiinflammatory  Medications such as  voltaren aleve can help but   Can  Negatively effect your kidney function.   You are also  due for yearly blood tests monitoring  For your hypertension and cholesterol levels .  Etc .    Please make appt for  fasting  Lab  I will put in order and then ROV     For f/u med check after that   .  You can try aleve 1-2  once a day for 1-2 weeks      Laisa Larrick K. Goro Wenrick M.D.

## 2014-08-25 ENCOUNTER — Encounter: Payer: Self-pay | Admitting: Internal Medicine

## 2014-08-25 DIAGNOSIS — Z853 Personal history of malignant neoplasm of breast: Secondary | ICD-10-CM | POA: Insufficient documentation

## 2014-08-30 DIAGNOSIS — H2513 Age-related nuclear cataract, bilateral: Secondary | ICD-10-CM | POA: Diagnosis not present

## 2014-08-30 DIAGNOSIS — J3089 Other allergic rhinitis: Secondary | ICD-10-CM | POA: Diagnosis not present

## 2014-08-30 DIAGNOSIS — J301 Allergic rhinitis due to pollen: Secondary | ICD-10-CM | POA: Diagnosis not present

## 2014-09-02 ENCOUNTER — Other Ambulatory Visit (INDEPENDENT_AMBULATORY_CARE_PROVIDER_SITE_OTHER): Payer: Medicare Other

## 2014-09-02 DIAGNOSIS — M2569 Stiffness of other specified joint, not elsewhere classified: Secondary | ICD-10-CM

## 2014-09-02 DIAGNOSIS — M2560 Stiffness of unspecified joint, not elsewhere classified: Secondary | ICD-10-CM | POA: Diagnosis not present

## 2014-09-02 DIAGNOSIS — Z79899 Other long term (current) drug therapy: Secondary | ICD-10-CM | POA: Diagnosis not present

## 2014-09-02 DIAGNOSIS — M2550 Pain in unspecified joint: Secondary | ICD-10-CM

## 2014-09-02 DIAGNOSIS — E785 Hyperlipidemia, unspecified: Secondary | ICD-10-CM | POA: Diagnosis not present

## 2014-09-02 DIAGNOSIS — I10 Essential (primary) hypertension: Secondary | ICD-10-CM

## 2014-09-02 DIAGNOSIS — M545 Low back pain: Secondary | ICD-10-CM | POA: Diagnosis not present

## 2014-09-02 LAB — POCT URINALYSIS DIPSTICK
Bilirubin, UA: NEGATIVE
Glucose, UA: NEGATIVE
Ketones, UA: NEGATIVE
Leukocytes, UA: NEGATIVE
Nitrite, UA: NEGATIVE
Protein, UA: NEGATIVE
Spec Grav, UA: 1.015
Urobilinogen, UA: 0.2
pH, UA: 7

## 2014-09-02 LAB — CBC WITH DIFFERENTIAL/PLATELET
Basophils Absolute: 0 10*3/uL (ref 0.0–0.1)
Basophils Relative: 0.3 % (ref 0.0–3.0)
Eosinophils Absolute: 0.2 10*3/uL (ref 0.0–0.7)
Eosinophils Relative: 2.4 % (ref 0.0–5.0)
HCT: 40 % (ref 36.0–46.0)
Hemoglobin: 13 g/dL (ref 12.0–15.0)
Lymphocytes Relative: 18.4 % (ref 12.0–46.0)
Lymphs Abs: 1.7 10*3/uL (ref 0.7–4.0)
MCHC: 32.6 g/dL (ref 30.0–36.0)
MCV: 84.2 fl (ref 78.0–100.0)
Monocytes Absolute: 0.7 10*3/uL (ref 0.1–1.0)
Monocytes Relative: 7.2 % (ref 3.0–12.0)
Neutro Abs: 6.5 10*3/uL (ref 1.4–7.7)
Neutrophils Relative %: 71.7 % (ref 43.0–77.0)
Platelets: 311 10*3/uL (ref 150.0–400.0)
RBC: 4.74 Mil/uL (ref 3.87–5.11)
RDW: 15.1 % (ref 11.5–15.5)
WBC: 9 10*3/uL (ref 4.0–10.5)

## 2014-09-02 LAB — C-REACTIVE PROTEIN: CRP: 0.2 mg/dL — ABNORMAL LOW (ref 0.5–20.0)

## 2014-09-02 LAB — LIPID PANEL
Cholesterol: 183 mg/dL (ref 0–200)
HDL: 60.7 mg/dL (ref >39.00)
LDL Cholesterol: 101 mg/dL — ABNORMAL HIGH (ref 0–99)
NonHDL: 122.3
Total CHOL/HDL Ratio: 3
Triglycerides: 108 mg/dL (ref 0.0–149.0)
VLDL: 21.6 mg/dL (ref 0.0–40.0)

## 2014-09-02 LAB — BASIC METABOLIC PANEL
BUN: 18 mg/dL (ref 6–23)
CO2: 31 mEq/L (ref 19–32)
Calcium: 9.4 mg/dL (ref 8.4–10.5)
Chloride: 101 mEq/L (ref 96–112)
Creatinine, Ser: 0.65 mg/dL (ref 0.40–1.20)
GFR: 112.16 mL/min (ref >60.00)
Glucose, Bld: 81 mg/dL (ref 70–99)
Potassium: 3.5 mEq/L (ref 3.5–5.1)
Sodium: 140 mEq/L (ref 135–145)

## 2014-09-02 LAB — HEPATIC FUNCTION PANEL
ALT: 13 U/L (ref 0–35)
AST: 16 U/L (ref 0–37)
Albumin: 3.7 g/dL (ref 3.5–5.2)
Alkaline Phosphatase: 66 U/L (ref 39–117)
Bilirubin, Direct: 0.1 mg/dL (ref 0.0–0.3)
Total Bilirubin: 0.4 mg/dL (ref 0.2–1.2)
Total Protein: 7.1 g/dL (ref 6.0–8.3)

## 2014-09-02 LAB — TSH: TSH: 0.43 u[IU]/mL (ref 0.35–4.50)

## 2014-09-02 LAB — FERRITIN: Ferritin: 19.9 ng/mL (ref 10.0–291.0)

## 2014-09-02 LAB — T4, FREE: Free T4: 0.76 ng/dL (ref 0.60–1.60)

## 2014-09-02 LAB — CK: Total CK: 61 U/L (ref 7–177)

## 2014-09-03 LAB — CYCLIC CITRUL PEPTIDE ANTIBODY, IGG: Cyclic Citrullin Peptide Ab: <2 U/mL (ref 0.0–5.0)

## 2014-09-08 DIAGNOSIS — J301 Allergic rhinitis due to pollen: Secondary | ICD-10-CM | POA: Diagnosis not present

## 2014-09-08 DIAGNOSIS — J3089 Other allergic rhinitis: Secondary | ICD-10-CM | POA: Diagnosis not present

## 2014-09-08 DIAGNOSIS — J3081 Allergic rhinitis due to animal (cat) (dog) hair and dander: Secondary | ICD-10-CM | POA: Diagnosis not present

## 2014-09-09 DIAGNOSIS — M5136 Other intervertebral disc degeneration, lumbar region: Secondary | ICD-10-CM | POA: Diagnosis not present

## 2014-09-09 DIAGNOSIS — M2550 Pain in unspecified joint: Secondary | ICD-10-CM | POA: Diagnosis not present

## 2014-09-09 DIAGNOSIS — M549 Dorsalgia, unspecified: Secondary | ICD-10-CM | POA: Diagnosis not present

## 2014-09-10 ENCOUNTER — Encounter: Payer: Self-pay | Admitting: Internal Medicine

## 2014-09-10 ENCOUNTER — Ambulatory Visit (INDEPENDENT_AMBULATORY_CARE_PROVIDER_SITE_OTHER): Payer: Medicare Other | Admitting: Internal Medicine

## 2014-09-10 VITALS — BP 126/74 | Temp 98.5°F | Ht 60.0 in | Wt 152.5 lb

## 2014-09-10 DIAGNOSIS — M2550 Pain in unspecified joint: Secondary | ICD-10-CM

## 2014-09-10 DIAGNOSIS — E785 Hyperlipidemia, unspecified: Secondary | ICD-10-CM

## 2014-09-10 DIAGNOSIS — I10 Essential (primary) hypertension: Secondary | ICD-10-CM

## 2014-09-10 DIAGNOSIS — Z7189 Other specified counseling: Secondary | ICD-10-CM

## 2014-09-10 DIAGNOSIS — M2560 Stiffness of unspecified joint, not elsewhere classified: Secondary | ICD-10-CM | POA: Diagnosis not present

## 2014-09-10 DIAGNOSIS — R7989 Other specified abnormal findings of blood chemistry: Secondary | ICD-10-CM

## 2014-09-10 DIAGNOSIS — R8290 Unspecified abnormal findings in urine: Secondary | ICD-10-CM | POA: Diagnosis not present

## 2014-09-10 DIAGNOSIS — Z7185 Encounter for immunization safety counseling: Secondary | ICD-10-CM

## 2014-09-10 DIAGNOSIS — M2569 Stiffness of other specified joint, not elsewhere classified: Secondary | ICD-10-CM

## 2014-09-10 MED ORDER — LISINOPRIL-HYDROCHLOROTHIAZIDE 20-12.5 MG PO TABS: 1.0000 | ORAL_TABLET | Freq: Two times a day (BID) | ORAL | Status: DC

## 2014-09-10 NOTE — Progress Notes (Signed)
Pre visit review using our clinic review tool, if applicable. No additional management support is needed unless otherwise documented below in the visit note.  Chief Complaint  Patient presents with  . Follow-up    labs back   . Hypertension    HPI: [name] 81 y.o.  comes in for chronic disease/ medication management  Since her last visit she has had her back evaluated she was given the medicine she got in [place] which we think was vault targeted helped a bit she's having a good day today she did have x-rays supposed to go back  HT is taking her medicine daily seems to be controlled no side effects LIPIDS taking medication. Had question about thyroid when it was abnormal a year or 2 ago. Had 2 different opinions. No treatment. Says she feels fine. ROS: See pertinent positives and negatives per HPI.  Past Medical History  Diagnosis Date  . Allergy   . Asthma   . GERD (gastroesophageal reflux disease)   . Hyperlipidemia   . Hypertension   . Anemia 2011    Since last visit she has had a Gi evaluation for newer onset Iron deficiiency anemia that was felt secondary to avm in duodenum rx with laser ablation.  . Cancer     rt breast  . Arthritis     in back    Family History  Problem Relation Age of Onset  . Cancer Mother     breast and ovarian    History   Social History  . Marital Status: Widowed    Spouse Name: N/A  . Number of Children: N/A  . Years of Education: N/A   Social History Main Topics  . Smoking status: Former Smoker -- 0.25 packs/day for 4 years    Types: Cigarettes    Quit date: 03/05/1968  . Smokeless tobacco: Never Used  . Alcohol Use: No  . Drug Use: No  . Sexual Activity: Not on file   Other Topics Concern  . None   Social History Narrative   Widowed 2001    Ex smoker   HHof 1    Active in church and volunteer activities    [place] a few times per week   Swimming 2 x per week.    Tobacco 49=56 less than 1ppd.               Outpatient Prescriptions Prior to Visit  Medication Sig Dispense Refill  . amLODipine (NORVASC) 10 MG tablet Take 1 tablet (10 mg total) by mouth daily. 90 tablet 1  . budesonide-formoterol (SYMBICORT) 160-4.5 MCG/ACT inhaler Inhale 2 puffs into the lungs 2 (two) times daily as needed (Shortness of breath).     . cycloSPORINE (RESTASIS) 0.05 % ophthalmic emulsion Place 1 drop into both eyes 2 (two) times daily.    Marland Kitchen EPIPEN 2-PAK 0.3 MG/0.3ML SOAJ injection INJ UTD PRN  1  . ferrous sulfate 325 (65 FE) MG tablet Take 325 mg by mouth daily with breakfast.    . Magnesium 500 MG CAPS Take 500 mg by mouth daily.    . montelukast (SINGULAIR) 10 MG tablet Take 10 mg by mouth at bedtime.    Marland Kitchen omeprazole (PRILOSEC) 20 MG capsule Take 20 mg by mouth daily as needed (Acid Reflux).    . potassium chloride SA (K-DUR,KLOR-CON) 20 MEQ tablet Take 1 tablet (20 mEq total) by mouth 2 (two) times daily. 180 tablet 3  . simvastatin (ZOCOR) 20 MG tablet Take 1 tablet (  20 mg total) by mouth at bedtime. 90 tablet 3  . solifenacin (VESICARE) 5 MG tablet Take 1 tablet (5 mg total) by mouth daily. 90 tablet 3  . lisinopril-hydrochlorothiazide (PRINZIDE,ZESTORETIC) 20-12.5 MG per tablet Take 1 tablet by mouth 2 (two) times daily.    Marland Kitchen amoxicillin (AMOXIL) 500 MG capsule Take 500 mg by mouth once. Prior to surgery or procedures only     No facility-administered medications prior to visit.     EXAM:  BP 126/74 mmHg  Temp(Src) 98.5 F (36.9 C) (Oral)  Ht 5' (1.524 m)  Wt 152 lb 8 oz (69.174 kg)  BMI 29.78 kg/m2  Body mass index is 29.78 kg/(m^2).  GENERAL: vitals reviewed and listed above, alert, oriented, appears well hydrated and in no acute distress HEENT: atraumatic, conjunctiva  clear, no obvious abnormalities on inspection of external nose and ears  NECK: no obvious masses on inspection   CV: HRRR, no clubbing cyanosis or   nl cap refill  MS: moves all extremities without noticeable focal   abnormality she is walking much better today PSYCH: pleasant and cooperative, no obvious depression or anxiety Lab Results  Component Value Date   WBC 9.0 09/02/2014   HGB 13.0 09/02/2014   HCT 40.0 09/02/2014   PLT 311.0 09/02/2014   GLUCOSE 81 09/02/2014   CHOL 183 09/02/2014   TRIG 108.0 09/02/2014   HDL 60.70 09/02/2014   LDLCALC 101* 09/02/2014   ALT 13 09/02/2014   AST 16 09/02/2014   NA 140 09/02/2014   K 3.5 09/02/2014   CL 101 09/02/2014   CREATININE 0.65 09/02/2014   BUN 18 09/02/2014   CO2 31 09/02/2014   TSH 0.43 09/02/2014   Health Maintenance Due  Topic Date Due  . ZOSTAVAX  06/08/1992  . TETANUS/TDAP  07/22/2011    Record review discussed with patient including her previous thyroid evaluations in 2013 and 2014. Suggest yearly TSH level. ASSESSMENT AND PLAN:  Discussed the following assessment and plan:  Essential hypertension - refilled med today controlled   Hyperlipidemia - good level stay on med   Abnormal urine finding - Plan: Urinalysis, Microscopic - [facility]  Back stiffness - under eval better today had x ray  given boltaren not taken yet .  Multiple joint pain  Abnormal thyroid stimulating hormone level - hx of reviewed 2014 dr Latrelle Dodrill rx repeat nl and dr Dwyane Dee adv not significant nl since then and no sx .   Immunization counseling - disc td and zostavax   she will check woth insurance and return for this if she wishes Uncertain if the last one dipstick blood is significant discussed this with her and that at her convenience she can get a urinalysis with microscopic at the [facility] lab follow-up as appropriate depending on results. Reviewed preventive measures vaccines that she can check into reimbursement cost. -Patient advised to return or notify health care team  if symptoms worsen ,persist or new concerns arise. Total visit 82mins > 50% spent counseling and coordinating care as indicated in above note and in instructions to patient .      Patient Instructions  Continue lifestyle intervention healthy eating and exercise .  Check into shingles vaccine ( Zostavax) reimbursement or cost to you  and can return at any time if call ahead for injection. Also  Ask about TD tetanus update.   Follow thyroid  Yearly .  Caution with Voltaren  Because of effect  on kidney and  blood pressure control. When  convenient get a urinalysis at the [place] office to makje sure there is no significant blood in the urine.   If all ok yearly  Preventive and labs as above    Mariann Laster K. Panosh M.D.

## 2014-09-10 NOTE — Patient Instructions (Signed)
Continue lifestyle intervention healthy eating and exercise .  Check into shingles vaccine ( Zostavax) reimbursement or cost to you  and can return at any time if call ahead for injection. Also  Ask about TD tetanus update.   Follow thyroid  Yearly .  Caution with Voltaren  Because of effect  on kidney and  blood pressure control. When  convenient get a urinalysis at the [place] office to [facility] sure there is no significant blood in the urine.   If all ok yearly  Preventive and labs as above

## 2014-09-14 ENCOUNTER — Other Ambulatory Visit (INDEPENDENT_AMBULATORY_CARE_PROVIDER_SITE_OTHER): Payer: Medicare Other

## 2014-09-14 DIAGNOSIS — R8290 Unspecified abnormal findings in urine: Secondary | ICD-10-CM | POA: Diagnosis not present

## 2014-09-14 DIAGNOSIS — J301 Allergic rhinitis due to pollen: Secondary | ICD-10-CM | POA: Diagnosis not present

## 2014-09-14 DIAGNOSIS — J3081 Allergic rhinitis due to animal (cat) (dog) hair and dander: Secondary | ICD-10-CM | POA: Diagnosis not present

## 2014-09-14 DIAGNOSIS — J3089 Other allergic rhinitis: Secondary | ICD-10-CM | POA: Diagnosis not present

## 2014-09-14 LAB — URINALYSIS, ROUTINE W REFLEX MICROSCOPIC
Bilirubin Urine: NEGATIVE
Ketones, ur: NEGATIVE
Leukocytes, UA: NEGATIVE
Nitrite: NEGATIVE
Specific Gravity, Urine: 1.02 (ref 1.000–1.030)
Urine Glucose: NEGATIVE
Urobilinogen, UA: 0.2 (ref 0.0–1.0)
pH: 6 (ref 5.0–8.0)

## 2014-09-22 DIAGNOSIS — J3089 Other allergic rhinitis: Secondary | ICD-10-CM | POA: Diagnosis not present

## 2014-09-22 DIAGNOSIS — J3081 Allergic rhinitis due to animal (cat) (dog) hair and dander: Secondary | ICD-10-CM | POA: Diagnosis not present

## 2014-09-22 DIAGNOSIS — J301 Allergic rhinitis due to pollen: Secondary | ICD-10-CM | POA: Diagnosis not present

## 2014-09-27 ENCOUNTER — Encounter: Payer: Self-pay | Admitting: Internal Medicine

## 2014-09-27 DIAGNOSIS — R3129 Other microscopic hematuria: Secondary | ICD-10-CM | POA: Insufficient documentation

## 2014-09-28 DIAGNOSIS — J3081 Allergic rhinitis due to animal (cat) (dog) hair and dander: Secondary | ICD-10-CM | POA: Diagnosis not present

## 2014-09-28 DIAGNOSIS — J301 Allergic rhinitis due to pollen: Secondary | ICD-10-CM | POA: Diagnosis not present

## 2014-09-28 DIAGNOSIS — J3089 Other allergic rhinitis: Secondary | ICD-10-CM | POA: Diagnosis not present

## 2014-10-01 ENCOUNTER — Other Ambulatory Visit: Payer: Self-pay | Admitting: Family Medicine

## 2014-10-01 DIAGNOSIS — R718 Other abnormality of red blood cells: Secondary | ICD-10-CM

## 2014-10-05 ENCOUNTER — Other Ambulatory Visit (INDEPENDENT_AMBULATORY_CARE_PROVIDER_SITE_OTHER): Payer: Medicare Other

## 2014-10-05 DIAGNOSIS — R718 Other abnormality of red blood cells: Secondary | ICD-10-CM

## 2014-10-05 DIAGNOSIS — J301 Allergic rhinitis due to pollen: Secondary | ICD-10-CM | POA: Diagnosis not present

## 2014-10-05 DIAGNOSIS — J3089 Other allergic rhinitis: Secondary | ICD-10-CM | POA: Diagnosis not present

## 2014-10-05 LAB — URINALYSIS, MICROSCOPIC ONLY

## 2014-10-12 DIAGNOSIS — J3089 Other allergic rhinitis: Secondary | ICD-10-CM | POA: Diagnosis not present

## 2014-10-12 DIAGNOSIS — J301 Allergic rhinitis due to pollen: Secondary | ICD-10-CM | POA: Diagnosis not present

## 2014-10-12 DIAGNOSIS — J3081 Allergic rhinitis due to animal (cat) (dog) hair and dander: Secondary | ICD-10-CM | POA: Diagnosis not present

## 2014-10-19 DIAGNOSIS — J301 Allergic rhinitis due to pollen: Secondary | ICD-10-CM | POA: Diagnosis not present

## 2014-10-19 DIAGNOSIS — J3089 Other allergic rhinitis: Secondary | ICD-10-CM | POA: Diagnosis not present

## 2014-10-26 ENCOUNTER — Other Ambulatory Visit: Payer: Medicare Other

## 2014-10-26 DIAGNOSIS — J3089 Other allergic rhinitis: Secondary | ICD-10-CM | POA: Diagnosis not present

## 2014-10-26 DIAGNOSIS — J301 Allergic rhinitis due to pollen: Secondary | ICD-10-CM | POA: Diagnosis not present

## 2014-10-26 DIAGNOSIS — J3081 Allergic rhinitis due to animal (cat) (dog) hair and dander: Secondary | ICD-10-CM | POA: Diagnosis not present

## 2014-10-28 DIAGNOSIS — J3089 Other allergic rhinitis: Secondary | ICD-10-CM | POA: Diagnosis not present

## 2014-10-28 DIAGNOSIS — J3081 Allergic rhinitis due to animal (cat) (dog) hair and dander: Secondary | ICD-10-CM | POA: Diagnosis not present

## 2014-10-29 ENCOUNTER — Telehealth: Payer: Self-pay | Admitting: Internal Medicine

## 2014-10-29 NOTE — Telephone Encounter (Signed)
Pt request refill of the following: omeprazole (PRILOSEC) 20 MG capsule   Phamacy: [facility]

## 2014-11-02 DIAGNOSIS — J3089 Other allergic rhinitis: Secondary | ICD-10-CM | POA: Diagnosis not present

## 2014-11-02 DIAGNOSIS — J301 Allergic rhinitis due to pollen: Secondary | ICD-10-CM | POA: Diagnosis not present

## 2014-11-02 DIAGNOSIS — J3081 Allergic rhinitis due to animal (cat) (dog) hair and dander: Secondary | ICD-10-CM | POA: Diagnosis not present

## 2014-11-02 MED ORDER — OMEPRAZOLE 20 MG PO CPDR: 20.0000 mg | DELAYED_RELEASE_CAPSULE | Freq: Every day | ORAL | Status: DC | PRN

## 2014-11-02 NOTE — Telephone Encounter (Signed)
Sent to the pharmacy by e-scribe. 

## 2014-11-10 ENCOUNTER — Telehealth: Payer: Self-pay | Admitting: *Deleted

## 2014-11-10 DIAGNOSIS — J3089 Other allergic rhinitis: Secondary | ICD-10-CM | POA: Diagnosis not present

## 2014-11-10 DIAGNOSIS — J301 Allergic rhinitis due to pollen: Secondary | ICD-10-CM | POA: Diagnosis not present

## 2014-11-10 DIAGNOSIS — J3081 Allergic rhinitis due to animal (cat) (dog) hair and dander: Secondary | ICD-10-CM | POA: Diagnosis not present

## 2014-11-10 NOTE — Telephone Encounter (Signed)
No blood test  Just the urinalysis with microscopic.

## 2014-11-10 NOTE — Telephone Encounter (Signed)
Pt States that she got blood work done back in Aug and was also referred to the Rheum Doctor. That Doctor referred her to another Dr within that office ( Dr. Lavone Neri). She has an upcoming appt on Oct 6th and will possible have lab work done. She wants to ask Dr. Regis Bill is there any additional lab work that she may need so she doesn't have to get stuck twice. So they can draw it at Dr. Lavone Neri office. Pt is requesting a call back [phone].

## 2014-11-15 NOTE — Telephone Encounter (Signed)
Pt notified that no lab work is needed but will need to get future urine.

## 2014-11-17 DIAGNOSIS — J3089 Other allergic rhinitis: Secondary | ICD-10-CM | POA: Diagnosis not present

## 2014-11-17 DIAGNOSIS — J301 Allergic rhinitis due to pollen: Secondary | ICD-10-CM | POA: Diagnosis not present

## 2014-11-23 ENCOUNTER — Ambulatory Visit (INDEPENDENT_AMBULATORY_CARE_PROVIDER_SITE_OTHER): Payer: Medicare Other | Admitting: Family Medicine

## 2014-11-23 DIAGNOSIS — J3081 Allergic rhinitis due to animal (cat) (dog) hair and dander: Secondary | ICD-10-CM | POA: Diagnosis not present

## 2014-11-23 DIAGNOSIS — J301 Allergic rhinitis due to pollen: Secondary | ICD-10-CM | POA: Diagnosis not present

## 2014-11-23 DIAGNOSIS — Z23 Encounter for immunization: Secondary | ICD-10-CM

## 2014-11-23 DIAGNOSIS — J3089 Other allergic rhinitis: Secondary | ICD-10-CM | POA: Diagnosis not present

## 2014-11-30 DIAGNOSIS — J3081 Allergic rhinitis due to animal (cat) (dog) hair and dander: Secondary | ICD-10-CM | POA: Diagnosis not present

## 2014-11-30 DIAGNOSIS — J3089 Other allergic rhinitis: Secondary | ICD-10-CM | POA: Diagnosis not present

## 2014-11-30 DIAGNOSIS — J301 Allergic rhinitis due to pollen: Secondary | ICD-10-CM | POA: Diagnosis not present

## 2014-12-06 DIAGNOSIS — J3081 Allergic rhinitis due to animal (cat) (dog) hair and dander: Secondary | ICD-10-CM | POA: Diagnosis not present

## 2014-12-06 DIAGNOSIS — J3089 Other allergic rhinitis: Secondary | ICD-10-CM | POA: Diagnosis not present

## 2014-12-06 DIAGNOSIS — J301 Allergic rhinitis due to pollen: Secondary | ICD-10-CM | POA: Diagnosis not present

## 2014-12-09 ENCOUNTER — Other Ambulatory Visit: Payer: Medicare Other

## 2014-12-09 DIAGNOSIS — R3129 Other microscopic hematuria: Secondary | ICD-10-CM

## 2014-12-09 DIAGNOSIS — M549 Dorsalgia, unspecified: Secondary | ICD-10-CM | POA: Diagnosis not present

## 2014-12-09 DIAGNOSIS — M2550 Pain in unspecified joint: Secondary | ICD-10-CM | POA: Diagnosis not present

## 2014-12-09 DIAGNOSIS — M1990 Unspecified osteoarthritis, unspecified site: Secondary | ICD-10-CM | POA: Diagnosis not present

## 2014-12-14 DIAGNOSIS — J3089 Other allergic rhinitis: Secondary | ICD-10-CM | POA: Diagnosis not present

## 2014-12-14 DIAGNOSIS — J3081 Allergic rhinitis due to animal (cat) (dog) hair and dander: Secondary | ICD-10-CM | POA: Diagnosis not present

## 2014-12-14 DIAGNOSIS — J301 Allergic rhinitis due to pollen: Secondary | ICD-10-CM | POA: Diagnosis not present

## 2014-12-21 DIAGNOSIS — J301 Allergic rhinitis due to pollen: Secondary | ICD-10-CM | POA: Diagnosis not present

## 2014-12-21 DIAGNOSIS — J3089 Other allergic rhinitis: Secondary | ICD-10-CM | POA: Diagnosis not present

## 2014-12-21 DIAGNOSIS — J3081 Allergic rhinitis due to animal (cat) (dog) hair and dander: Secondary | ICD-10-CM | POA: Diagnosis not present

## 2014-12-28 ENCOUNTER — Telehealth: Payer: Self-pay | Admitting: Internal Medicine

## 2014-12-28 DIAGNOSIS — J3089 Other allergic rhinitis: Secondary | ICD-10-CM | POA: Diagnosis not present

## 2014-12-28 DIAGNOSIS — J3081 Allergic rhinitis due to animal (cat) (dog) hair and dander: Secondary | ICD-10-CM | POA: Diagnosis not present

## 2014-12-28 DIAGNOSIS — J301 Allergic rhinitis due to pollen: Secondary | ICD-10-CM | POA: Diagnosis not present

## 2014-12-28 NOTE — Telephone Encounter (Signed)
Pt states she received a call and wants to know if it was you! No notes. thanks

## 2014-12-28 NOTE — Telephone Encounter (Signed)
I have not contacted the pt.  Thanks!

## 2014-12-31 NOTE — Telephone Encounter (Signed)
Pt states she has not heard about results of urine test. Please call back with results

## 2015-01-03 NOTE — Telephone Encounter (Signed)
Spoke to the pt and advised that I had no results to give her.  She stated she went to the [facility] office two weeks ago.  Looks like test was not done in Epic.  Advised that she return to [facility] office.  Order still in the computer.

## 2015-01-04 DIAGNOSIS — J3089 Other allergic rhinitis: Secondary | ICD-10-CM | POA: Diagnosis not present

## 2015-01-04 DIAGNOSIS — J301 Allergic rhinitis due to pollen: Secondary | ICD-10-CM | POA: Diagnosis not present

## 2015-01-11 DIAGNOSIS — J3081 Allergic rhinitis due to animal (cat) (dog) hair and dander: Secondary | ICD-10-CM | POA: Diagnosis not present

## 2015-01-11 DIAGNOSIS — J3089 Other allergic rhinitis: Secondary | ICD-10-CM | POA: Diagnosis not present

## 2015-01-11 DIAGNOSIS — J301 Allergic rhinitis due to pollen: Secondary | ICD-10-CM | POA: Diagnosis not present

## 2015-01-13 ENCOUNTER — Other Ambulatory Visit (INDEPENDENT_AMBULATORY_CARE_PROVIDER_SITE_OTHER): Payer: Medicare Other

## 2015-01-13 DIAGNOSIS — R3129 Other microscopic hematuria: Secondary | ICD-10-CM

## 2015-01-13 LAB — URINALYSIS, MICROSCOPIC ONLY
RBC / HPF: NONE SEEN (ref 0–?)
WBC, UA: NONE SEEN (ref 0–?)

## 2015-01-18 DIAGNOSIS — J3081 Allergic rhinitis due to animal (cat) (dog) hair and dander: Secondary | ICD-10-CM | POA: Diagnosis not present

## 2015-01-18 DIAGNOSIS — J3089 Other allergic rhinitis: Secondary | ICD-10-CM | POA: Diagnosis not present

## 2015-01-18 DIAGNOSIS — J301 Allergic rhinitis due to pollen: Secondary | ICD-10-CM | POA: Diagnosis not present

## 2015-01-19 ENCOUNTER — Other Ambulatory Visit: Payer: Self-pay

## 2015-01-19 ENCOUNTER — Other Ambulatory Visit: Payer: Self-pay | Admitting: General Surgery

## 2015-01-19 DIAGNOSIS — C50911 Malignant neoplasm of unspecified site of right female breast: Secondary | ICD-10-CM

## 2015-01-20 ENCOUNTER — Encounter: Payer: Medicare Other | Admitting: Internal Medicine

## 2015-01-20 DIAGNOSIS — Z0289 Encounter for other administrative examinations: Secondary | ICD-10-CM

## 2015-01-20 DIAGNOSIS — J301 Allergic rhinitis due to pollen: Secondary | ICD-10-CM | POA: Diagnosis not present

## 2015-01-20 NOTE — Progress Notes (Signed)
Document opened and reviewed for OVt . No showed .but her lab was normal  But her last ua normal  Had one with 3-6 rbc per [facility]  Ht management  Has cpx in April 2017

## 2015-01-24 DIAGNOSIS — J3081 Allergic rhinitis due to animal (cat) (dog) hair and dander: Secondary | ICD-10-CM | POA: Diagnosis not present

## 2015-01-24 DIAGNOSIS — J301 Allergic rhinitis due to pollen: Secondary | ICD-10-CM | POA: Diagnosis not present

## 2015-01-24 DIAGNOSIS — J3089 Other allergic rhinitis: Secondary | ICD-10-CM | POA: Diagnosis not present

## 2015-01-25 ENCOUNTER — Ambulatory Visit: Payer: Medicare Other | Admitting: Internal Medicine

## 2015-01-31 ENCOUNTER — Telehealth: Payer: Self-pay | Admitting: Internal Medicine

## 2015-01-31 DIAGNOSIS — J3081 Allergic rhinitis due to animal (cat) (dog) hair and dander: Secondary | ICD-10-CM | POA: Diagnosis not present

## 2015-01-31 DIAGNOSIS — J301 Allergic rhinitis due to pollen: Secondary | ICD-10-CM | POA: Diagnosis not present

## 2015-01-31 DIAGNOSIS — J3089 Other allergic rhinitis: Secondary | ICD-10-CM | POA: Diagnosis not present

## 2015-01-31 NOTE — Telephone Encounter (Signed)
Left a message for a return call.

## 2015-01-31 NOTE — Telephone Encounter (Signed)
Pt canceled her last appt and no showed on 11/17.

## 2015-01-31 NOTE — Telephone Encounter (Signed)
Pt came into the office asking for her lab results. Has been waiting since the 10th for a phone call and is frustrated.

## 2015-02-01 ENCOUNTER — Encounter: Payer: Medicare Other | Admitting: Internal Medicine

## 2015-02-01 NOTE — Telephone Encounter (Signed)
Pt notified of normal results.

## 2015-02-07 DIAGNOSIS — J301 Allergic rhinitis due to pollen: Secondary | ICD-10-CM | POA: Diagnosis not present

## 2015-02-07 DIAGNOSIS — J3081 Allergic rhinitis due to animal (cat) (dog) hair and dander: Secondary | ICD-10-CM | POA: Diagnosis not present

## 2015-02-07 DIAGNOSIS — J3089 Other allergic rhinitis: Secondary | ICD-10-CM | POA: Diagnosis not present

## 2015-02-14 DIAGNOSIS — J3089 Other allergic rhinitis: Secondary | ICD-10-CM | POA: Diagnosis not present

## 2015-02-14 DIAGNOSIS — J301 Allergic rhinitis due to pollen: Secondary | ICD-10-CM | POA: Diagnosis not present

## 2015-02-14 DIAGNOSIS — J3081 Allergic rhinitis due to animal (cat) (dog) hair and dander: Secondary | ICD-10-CM | POA: Diagnosis not present

## 2015-02-21 DIAGNOSIS — J3089 Other allergic rhinitis: Secondary | ICD-10-CM | POA: Diagnosis not present

## 2015-02-21 DIAGNOSIS — J301 Allergic rhinitis due to pollen: Secondary | ICD-10-CM | POA: Diagnosis not present

## 2015-02-21 DIAGNOSIS — J3081 Allergic rhinitis due to animal (cat) (dog) hair and dander: Secondary | ICD-10-CM | POA: Diagnosis not present

## 2015-03-02 DIAGNOSIS — J301 Allergic rhinitis due to pollen: Secondary | ICD-10-CM | POA: Diagnosis not present

## 2015-03-02 DIAGNOSIS — J3089 Other allergic rhinitis: Secondary | ICD-10-CM | POA: Diagnosis not present

## 2015-03-02 DIAGNOSIS — J3081 Allergic rhinitis due to animal (cat) (dog) hair and dander: Secondary | ICD-10-CM | POA: Diagnosis not present

## 2015-03-21 DIAGNOSIS — J3089 Other allergic rhinitis: Secondary | ICD-10-CM | POA: Diagnosis not present

## 2015-03-21 DIAGNOSIS — J3081 Allergic rhinitis due to animal (cat) (dog) hair and dander: Secondary | ICD-10-CM | POA: Diagnosis not present

## 2015-03-21 DIAGNOSIS — J301 Allergic rhinitis due to pollen: Secondary | ICD-10-CM | POA: Diagnosis not present

## 2015-03-22 DIAGNOSIS — C50911 Malignant neoplasm of unspecified site of right female breast: Secondary | ICD-10-CM | POA: Diagnosis not present

## 2015-03-29 DIAGNOSIS — J3081 Allergic rhinitis due to animal (cat) (dog) hair and dander: Secondary | ICD-10-CM | POA: Diagnosis not present

## 2015-03-29 DIAGNOSIS — J3089 Other allergic rhinitis: Secondary | ICD-10-CM | POA: Diagnosis not present

## 2015-03-29 DIAGNOSIS — J301 Allergic rhinitis due to pollen: Secondary | ICD-10-CM | POA: Diagnosis not present

## 2015-04-04 DIAGNOSIS — J3081 Allergic rhinitis due to animal (cat) (dog) hair and dander: Secondary | ICD-10-CM | POA: Diagnosis not present

## 2015-04-04 DIAGNOSIS — J301 Allergic rhinitis due to pollen: Secondary | ICD-10-CM | POA: Diagnosis not present

## 2015-04-04 DIAGNOSIS — J3089 Other allergic rhinitis: Secondary | ICD-10-CM | POA: Diagnosis not present

## 2015-04-05 DIAGNOSIS — J3081 Allergic rhinitis due to animal (cat) (dog) hair and dander: Secondary | ICD-10-CM | POA: Diagnosis not present

## 2015-04-05 DIAGNOSIS — J3089 Other allergic rhinitis: Secondary | ICD-10-CM | POA: Diagnosis not present

## 2015-04-06 ENCOUNTER — Ambulatory Visit (INDEPENDENT_AMBULATORY_CARE_PROVIDER_SITE_OTHER): Payer: Medicare Other | Admitting: Internal Medicine

## 2015-04-06 ENCOUNTER — Encounter: Payer: Self-pay | Admitting: Internal Medicine

## 2015-04-06 VITALS — BP 156/80 | Temp 98.3°F | Ht 60.0 in | Wt 155.0 lb

## 2015-04-06 DIAGNOSIS — Z791 Long term (current) use of non-steroidal anti-inflammatories (NSAID): Secondary | ICD-10-CM | POA: Diagnosis not present

## 2015-04-06 DIAGNOSIS — Z79899 Other long term (current) drug therapy: Secondary | ICD-10-CM

## 2015-04-06 DIAGNOSIS — Z853 Personal history of malignant neoplasm of breast: Secondary | ICD-10-CM

## 2015-04-06 DIAGNOSIS — I10 Essential (primary) hypertension: Secondary | ICD-10-CM | POA: Diagnosis not present

## 2015-04-06 DIAGNOSIS — M25511 Pain in right shoulder: Secondary | ICD-10-CM | POA: Diagnosis not present

## 2015-04-06 LAB — BASIC METABOLIC PANEL
BUN: 9 mg/dL (ref 6–23)
CO2: 30 mEq/L (ref 19–32)
Calcium: 9.8 mg/dL (ref 8.4–10.5)
Chloride: 101 mEq/L (ref 96–112)
Creatinine, Ser: 0.68 mg/dL (ref 0.40–1.20)
GFR: 106.32 mL/min (ref >60.00)
Glucose, Bld: 86 mg/dL (ref 70–99)
Potassium: 4 mEq/L (ref 3.5–5.1)
Sodium: 142 mEq/L (ref 135–145)

## 2015-04-06 NOTE — Patient Instructions (Addendum)
Continue monitoring  bp should be below 140/90 .  Diclofenac can help arthritis but can negatively effect kidneys and BP in some people so checking labs today to make sure kidney function is stable. If all ok then  See you ar your next check up . If bp is up we can  Adjust blood pressure medicine   Your arm shoulder pain could also be arthritis  Bursitis .

## 2015-04-06 NOTE — Progress Notes (Signed)
Pre visit review using our clinic review tool, if applicable. No additional management support is needed unless otherwise documented below in the visit note.   Chief Complaint  Patient presents with  . Follow-up    HPI: [name] 82 y.o.  comes in for chronic disease/ medication management   BPAt home 124/80 fels well  On meds  Legs not as swollen Bowls    Travels      No cp sob asthma ok    But feels" great ". To go to [place] .   Taking daily voltaren  Per specialist  No gi sx   Has had right arm pain  ? If from injections  In the arm after she had breast surgery  But no swelling  .hard to raise shoulder above  90 degrees   No injury   ROS: See pertinent positives and negatives per HPI. No falling new injury   Past Medical History  Diagnosis Date  . Allergy   . Asthma   . GERD (gastroesophageal reflux disease)   . Hyperlipidemia   . Hypertension   . Anemia 2011    Since last visit she has had a Gi evaluation for newer onset Iron deficiiency anemia that was felt secondary to avm in duodenum rx with laser ablation.  . Cancer ([place])     rt breast  . Arthritis     in back    Family History  Problem Relation Age of Onset  . Cancer Mother     breast and ovarian    Social History   Social History  . Marital Status: Widowed    Spouse Name: N/A  . Number of Children: N/A  . Years of Education: N/A   Social History Main Topics  . Smoking status: Former Smoker -- 0.25 packs/day for 4 years    Types: Cigarettes    Quit date: 03/05/1968  . Smokeless tobacco: Never Used  . Alcohol Use: No  . Drug Use: No  . Sexual Activity: Not Asked   Other Topics Concern  . None   Social History Narrative   Widowed 2001    Ex smoker   HHof 1    Active in church and volunteer activities    [place] a few times per week   Swimming 2 x per week.    Tobacco 49=58 less than 1ppd.              Outpatient Prescriptions Prior to Visit  Medication Sig Dispense Refill  .  amLODipine (NORVASC) 10 MG tablet Take 1 tablet (10 mg total) by mouth daily. 90 tablet 1  . budesonide-formoterol (SYMBICORT) 160-4.5 MCG/ACT inhaler Inhale 2 puffs into the lungs 2 (two) times daily as needed (Shortness of breath).     . cycloSPORINE (RESTASIS) 0.05 % ophthalmic emulsion Place 1 drop into both eyes 2 (two) times daily.    Marland Kitchen EPIPEN 2-PAK 0.3 MG/0.3ML SOAJ injection INJ UTD PRN  1  . ferrous sulfate 325 (65 FE) MG tablet Take 325 mg by mouth daily with breakfast.    . lisinopril-hydrochlorothiazide (PRINZIDE,ZESTORETIC) 20-12.5 MG per tablet Take 1 tablet by mouth 2 (two) times daily. 180 tablet 3  . Magnesium 500 MG CAPS Take 500 mg by mouth daily.    . montelukast (SINGULAIR) 10 MG tablet Take 10 mg by mouth at bedtime.    Marland Kitchen omeprazole (PRILOSEC) 20 MG capsule Take 1 capsule (20 mg total) by mouth daily as needed (Acid Reflux). 90 capsule 2  .  potassium chloride SA (K-DUR,KLOR-CON) 20 MEQ tablet Take 1 tablet (20 mEq total) by mouth 2 (two) times daily. 180 tablet 3  . simvastatin (ZOCOR) 20 MG tablet Take 1 tablet (20 mg total) by mouth at bedtime. 90 tablet 3  . solifenacin (VESICARE) 5 MG tablet Take 1 tablet (5 mg total) by mouth daily. 90 tablet 3  . amoxicillin (AMOXIL) 500 MG capsule Take 500 mg by mouth once. Reported on 04/06/2015     No facility-administered medications prior to visit.     EXAM:  BP 156/80 mmHg  Temp(Src) 98.3 F (36.8 C) (Oral)  Ht 5' (1.524 m)  Wt 155 lb (70.308 kg)  BMI 30.27 kg/m2  Body mass index is 30.27 kg/(m^2).  GENERAL: vitals reviewed and listed above, alert, oriented, appears well hydrated and in no acute distress look younger than stated age  HEENT: atraumatic, conjunctiva  clear, no obvious abnormalities on inspection of external nose and ears  NECK: no obvious masses on inspection palpation  LUNGS: clear to auscultation bilaterally, no wheezes, rales or rhonchi, CV: HRRR, no clubbing cyanosis or  Slight  peripheral edema nl  cap refill  MS: moves all extremities without noticeable focal  Abnormality walks with cane  But steady  Right shoulder dec rom elevation discomfort ant shoulder   PSYCH: pleasant and cooperative, no obvious depression or anxiety   ASSESSMENT AND PLAN:  Discussed the following assessment and plan:  Essential hypertension - up today readings in range  x 2 recently  - Plan: Basic metabolic panel  Medication management - Plan: Basic metabolic panel  Right shoulder pain - arm dont think from breast surgery  poss bursitis arthritis  some lom  disc seeign ortho  names given gso   NSAID long-term use - dsic risk benefot  check periodic bmp  Total visit 22mins > 50% spent counseling and coordinating care as indicated in above note and in instructions to patient .  -Patient advised to return or notify health care team  if symptoms worsen ,persist or new concerns arise.  Patient Instructions  Continue monitoring  bp should be below 140/90 .  Diclofenac can help arthritis but can negatively effect kidneys and BP in some people so checking labs today to make sure kidney function is stable. If all ok then  See you ar your next check up . If bp is up we can  Adjust blood pressure medicine   Your arm shoulder pain could also be arthritis  Bursitis .       Standley Brooking. Aleera Gilcrease M.D.  Lab Results  Component Value Date   WBC 9.0 09/02/2014   HGB 13.0 09/02/2014   HCT 40.0 09/02/2014   PLT 311.0 09/02/2014   GLUCOSE 86 04/06/2015   CHOL 183 09/02/2014   TRIG 108.0 09/02/2014   HDL 60.70 09/02/2014   LDLCALC 101* 09/02/2014   ALT 13 09/02/2014   AST 16 09/02/2014   NA 142 04/06/2015   K 4.0 04/06/2015   CL 101 04/06/2015   CREATININE 0.68 04/06/2015   BUN 9 04/06/2015   CO2 30 04/06/2015   TSH 0.43 09/02/2014

## 2015-04-11 DIAGNOSIS — J3089 Other allergic rhinitis: Secondary | ICD-10-CM | POA: Diagnosis not present

## 2015-04-11 DIAGNOSIS — J301 Allergic rhinitis due to pollen: Secondary | ICD-10-CM | POA: Diagnosis not present

## 2015-04-11 DIAGNOSIS — J3081 Allergic rhinitis due to animal (cat) (dog) hair and dander: Secondary | ICD-10-CM | POA: Diagnosis not present

## 2015-04-12 ENCOUNTER — Ambulatory Visit
Admission: RE | Admit: 2015-04-12 | Discharge: 2015-04-12 | Disposition: A | Discharge status: Home / Self Care | Payer: Medicare Other | Source: Ambulatory Visit | Attending: General Surgery | Admitting: General Surgery

## 2015-04-12 DIAGNOSIS — R928 Other abnormal and inconclusive findings on diagnostic imaging of breast: Secondary | ICD-10-CM | POA: Diagnosis not present

## 2015-04-12 DIAGNOSIS — C50911 Malignant neoplasm of unspecified site of right female breast: Secondary | ICD-10-CM

## 2015-04-18 DIAGNOSIS — J3081 Allergic rhinitis due to animal (cat) (dog) hair and dander: Secondary | ICD-10-CM | POA: Diagnosis not present

## 2015-04-18 DIAGNOSIS — J3089 Other allergic rhinitis: Secondary | ICD-10-CM | POA: Diagnosis not present

## 2015-04-18 DIAGNOSIS — J301 Allergic rhinitis due to pollen: Secondary | ICD-10-CM | POA: Diagnosis not present

## 2015-04-19 DIAGNOSIS — M1990 Unspecified osteoarthritis, unspecified site: Secondary | ICD-10-CM | POA: Diagnosis not present

## 2015-04-19 DIAGNOSIS — M2550 Pain in unspecified joint: Secondary | ICD-10-CM | POA: Diagnosis not present

## 2015-04-19 DIAGNOSIS — M25519 Pain in unspecified shoulder: Secondary | ICD-10-CM | POA: Diagnosis not present

## 2015-04-19 DIAGNOSIS — M549 Dorsalgia, unspecified: Secondary | ICD-10-CM | POA: Diagnosis not present

## 2015-04-27 DIAGNOSIS — J3081 Allergic rhinitis due to animal (cat) (dog) hair and dander: Secondary | ICD-10-CM | POA: Diagnosis not present

## 2015-04-27 DIAGNOSIS — J3089 Other allergic rhinitis: Secondary | ICD-10-CM | POA: Diagnosis not present

## 2015-04-27 DIAGNOSIS — J301 Allergic rhinitis due to pollen: Secondary | ICD-10-CM | POA: Diagnosis not present

## 2015-05-02 DIAGNOSIS — J3089 Other allergic rhinitis: Secondary | ICD-10-CM | POA: Diagnosis not present

## 2015-05-02 DIAGNOSIS — J3081 Allergic rhinitis due to animal (cat) (dog) hair and dander: Secondary | ICD-10-CM | POA: Diagnosis not present

## 2015-05-02 DIAGNOSIS — J301 Allergic rhinitis due to pollen: Secondary | ICD-10-CM | POA: Diagnosis not present

## 2015-05-07 ENCOUNTER — Other Ambulatory Visit: Payer: Self-pay | Admitting: Internal Medicine

## 2015-05-09 DIAGNOSIS — J3081 Allergic rhinitis due to animal (cat) (dog) hair and dander: Secondary | ICD-10-CM | POA: Diagnosis not present

## 2015-05-09 DIAGNOSIS — J301 Allergic rhinitis due to pollen: Secondary | ICD-10-CM | POA: Diagnosis not present

## 2015-05-09 DIAGNOSIS — J3089 Other allergic rhinitis: Secondary | ICD-10-CM | POA: Diagnosis not present

## 2015-05-09 NOTE — Telephone Encounter (Signed)
Sent to the pharmacy by e-scribe. 

## 2015-05-18 DIAGNOSIS — J3081 Allergic rhinitis due to animal (cat) (dog) hair and dander: Secondary | ICD-10-CM | POA: Diagnosis not present

## 2015-05-18 DIAGNOSIS — J3089 Other allergic rhinitis: Secondary | ICD-10-CM | POA: Diagnosis not present

## 2015-05-18 DIAGNOSIS — J301 Allergic rhinitis due to pollen: Secondary | ICD-10-CM | POA: Diagnosis not present

## 2015-05-20 DIAGNOSIS — J3081 Allergic rhinitis due to animal (cat) (dog) hair and dander: Secondary | ICD-10-CM | POA: Diagnosis not present

## 2015-05-20 DIAGNOSIS — J3089 Other allergic rhinitis: Secondary | ICD-10-CM | POA: Diagnosis not present

## 2015-05-20 DIAGNOSIS — J301 Allergic rhinitis due to pollen: Secondary | ICD-10-CM | POA: Diagnosis not present

## 2015-05-25 DIAGNOSIS — J301 Allergic rhinitis due to pollen: Secondary | ICD-10-CM | POA: Diagnosis not present

## 2015-05-25 DIAGNOSIS — J3089 Other allergic rhinitis: Secondary | ICD-10-CM | POA: Diagnosis not present

## 2015-05-25 DIAGNOSIS — J3081 Allergic rhinitis due to animal (cat) (dog) hair and dander: Secondary | ICD-10-CM | POA: Diagnosis not present

## 2015-06-01 DIAGNOSIS — J3081 Allergic rhinitis due to animal (cat) (dog) hair and dander: Secondary | ICD-10-CM | POA: Diagnosis not present

## 2015-06-01 DIAGNOSIS — J301 Allergic rhinitis due to pollen: Secondary | ICD-10-CM | POA: Diagnosis not present

## 2015-06-01 DIAGNOSIS — J3089 Other allergic rhinitis: Secondary | ICD-10-CM | POA: Diagnosis not present

## 2015-06-06 DIAGNOSIS — J301 Allergic rhinitis due to pollen: Secondary | ICD-10-CM | POA: Diagnosis not present

## 2015-06-06 DIAGNOSIS — J3081 Allergic rhinitis due to animal (cat) (dog) hair and dander: Secondary | ICD-10-CM | POA: Diagnosis not present

## 2015-06-06 DIAGNOSIS — J3089 Other allergic rhinitis: Secondary | ICD-10-CM | POA: Diagnosis not present

## 2015-06-14 DIAGNOSIS — J301 Allergic rhinitis due to pollen: Secondary | ICD-10-CM | POA: Diagnosis not present

## 2015-06-14 DIAGNOSIS — J3081 Allergic rhinitis due to animal (cat) (dog) hair and dander: Secondary | ICD-10-CM | POA: Diagnosis not present

## 2015-06-14 DIAGNOSIS — J3089 Other allergic rhinitis: Secondary | ICD-10-CM | POA: Diagnosis not present

## 2015-06-20 DIAGNOSIS — J301 Allergic rhinitis due to pollen: Secondary | ICD-10-CM | POA: Diagnosis not present

## 2015-06-20 DIAGNOSIS — J3089 Other allergic rhinitis: Secondary | ICD-10-CM | POA: Diagnosis not present

## 2015-06-20 DIAGNOSIS — J3081 Allergic rhinitis due to animal (cat) (dog) hair and dander: Secondary | ICD-10-CM | POA: Diagnosis not present

## 2015-06-27 ENCOUNTER — Encounter: Payer: Medicare Other | Admitting: Internal Medicine

## 2015-06-27 ENCOUNTER — Other Ambulatory Visit: Payer: Medicare Other

## 2015-06-29 DIAGNOSIS — J301 Allergic rhinitis due to pollen: Secondary | ICD-10-CM | POA: Diagnosis not present

## 2015-06-29 DIAGNOSIS — J3089 Other allergic rhinitis: Secondary | ICD-10-CM | POA: Diagnosis not present

## 2015-06-29 DIAGNOSIS — J3081 Allergic rhinitis due to animal (cat) (dog) hair and dander: Secondary | ICD-10-CM | POA: Diagnosis not present

## 2015-07-04 DIAGNOSIS — J301 Allergic rhinitis due to pollen: Secondary | ICD-10-CM | POA: Diagnosis not present

## 2015-07-04 DIAGNOSIS — J3081 Allergic rhinitis due to animal (cat) (dog) hair and dander: Secondary | ICD-10-CM | POA: Diagnosis not present

## 2015-07-04 DIAGNOSIS — J3089 Other allergic rhinitis: Secondary | ICD-10-CM | POA: Diagnosis not present

## 2015-07-11 DIAGNOSIS — J3089 Other allergic rhinitis: Secondary | ICD-10-CM | POA: Diagnosis not present

## 2015-07-11 DIAGNOSIS — J3081 Allergic rhinitis due to animal (cat) (dog) hair and dander: Secondary | ICD-10-CM | POA: Diagnosis not present

## 2015-07-11 DIAGNOSIS — J301 Allergic rhinitis due to pollen: Secondary | ICD-10-CM | POA: Diagnosis not present

## 2015-07-18 DIAGNOSIS — J3089 Other allergic rhinitis: Secondary | ICD-10-CM | POA: Diagnosis not present

## 2015-07-18 DIAGNOSIS — J3081 Allergic rhinitis due to animal (cat) (dog) hair and dander: Secondary | ICD-10-CM | POA: Diagnosis not present

## 2015-07-18 DIAGNOSIS — J301 Allergic rhinitis due to pollen: Secondary | ICD-10-CM | POA: Diagnosis not present

## 2015-07-25 DIAGNOSIS — J3089 Other allergic rhinitis: Secondary | ICD-10-CM | POA: Diagnosis not present

## 2015-07-25 DIAGNOSIS — J301 Allergic rhinitis due to pollen: Secondary | ICD-10-CM | POA: Diagnosis not present

## 2015-07-25 DIAGNOSIS — J3081 Allergic rhinitis due to animal (cat) (dog) hair and dander: Secondary | ICD-10-CM | POA: Diagnosis not present

## 2015-08-05 DIAGNOSIS — J301 Allergic rhinitis due to pollen: Secondary | ICD-10-CM | POA: Diagnosis not present

## 2015-08-05 DIAGNOSIS — J4530 Mild persistent asthma, uncomplicated: Secondary | ICD-10-CM | POA: Diagnosis not present

## 2015-08-05 DIAGNOSIS — J3089 Other allergic rhinitis: Secondary | ICD-10-CM | POA: Diagnosis not present

## 2015-08-05 DIAGNOSIS — J3081 Allergic rhinitis due to animal (cat) (dog) hair and dander: Secondary | ICD-10-CM | POA: Diagnosis not present

## 2015-08-08 DIAGNOSIS — J301 Allergic rhinitis due to pollen: Secondary | ICD-10-CM | POA: Diagnosis not present

## 2015-08-12 DIAGNOSIS — J3089 Other allergic rhinitis: Secondary | ICD-10-CM | POA: Diagnosis not present

## 2015-08-12 DIAGNOSIS — J3081 Allergic rhinitis due to animal (cat) (dog) hair and dander: Secondary | ICD-10-CM | POA: Diagnosis not present

## 2015-08-12 DIAGNOSIS — J301 Allergic rhinitis due to pollen: Secondary | ICD-10-CM | POA: Diagnosis not present

## 2015-08-19 DIAGNOSIS — J3081 Allergic rhinitis due to animal (cat) (dog) hair and dander: Secondary | ICD-10-CM | POA: Diagnosis not present

## 2015-08-19 DIAGNOSIS — J3089 Other allergic rhinitis: Secondary | ICD-10-CM | POA: Diagnosis not present

## 2015-08-19 DIAGNOSIS — J301 Allergic rhinitis due to pollen: Secondary | ICD-10-CM | POA: Diagnosis not present

## 2015-08-26 DIAGNOSIS — J301 Allergic rhinitis due to pollen: Secondary | ICD-10-CM | POA: Diagnosis not present

## 2015-08-26 DIAGNOSIS — J3081 Allergic rhinitis due to animal (cat) (dog) hair and dander: Secondary | ICD-10-CM | POA: Diagnosis not present

## 2015-08-26 DIAGNOSIS — J3089 Other allergic rhinitis: Secondary | ICD-10-CM | POA: Diagnosis not present

## 2015-09-01 DIAGNOSIS — H04123 Dry eye syndrome of bilateral lacrimal glands: Secondary | ICD-10-CM | POA: Diagnosis not present

## 2015-09-01 DIAGNOSIS — H2513 Age-related nuclear cataract, bilateral: Secondary | ICD-10-CM | POA: Diagnosis not present

## 2015-09-01 DIAGNOSIS — H524 Presbyopia: Secondary | ICD-10-CM | POA: Diagnosis not present

## 2015-09-02 DIAGNOSIS — J3081 Allergic rhinitis due to animal (cat) (dog) hair and dander: Secondary | ICD-10-CM | POA: Diagnosis not present

## 2015-09-02 DIAGNOSIS — J3089 Other allergic rhinitis: Secondary | ICD-10-CM | POA: Diagnosis not present

## 2015-09-02 DIAGNOSIS — J301 Allergic rhinitis due to pollen: Secondary | ICD-10-CM | POA: Diagnosis not present

## 2015-09-13 DIAGNOSIS — J3081 Allergic rhinitis due to animal (cat) (dog) hair and dander: Secondary | ICD-10-CM | POA: Diagnosis not present

## 2015-09-13 DIAGNOSIS — J301 Allergic rhinitis due to pollen: Secondary | ICD-10-CM | POA: Diagnosis not present

## 2015-09-13 DIAGNOSIS — J3089 Other allergic rhinitis: Secondary | ICD-10-CM | POA: Diagnosis not present

## 2015-09-20 ENCOUNTER — Ambulatory Visit (INDEPENDENT_AMBULATORY_CARE_PROVIDER_SITE_OTHER): Payer: Medicare Other | Admitting: Internal Medicine

## 2015-09-20 ENCOUNTER — Encounter: Payer: Self-pay | Admitting: Internal Medicine

## 2015-09-20 VITALS — BP 114/70 | Temp 98.6°F | Ht 59.75 in | Wt 152.1 lb

## 2015-09-20 DIAGNOSIS — Z853 Personal history of malignant neoplasm of breast: Secondary | ICD-10-CM

## 2015-09-20 DIAGNOSIS — I10 Essential (primary) hypertension: Secondary | ICD-10-CM | POA: Diagnosis not present

## 2015-09-20 DIAGNOSIS — E785 Hyperlipidemia, unspecified: Secondary | ICD-10-CM | POA: Diagnosis not present

## 2015-09-20 DIAGNOSIS — Z0000 Encounter for general adult medical examination without abnormal findings: Secondary | ICD-10-CM | POA: Diagnosis not present

## 2015-09-20 DIAGNOSIS — R609 Edema, unspecified: Secondary | ICD-10-CM

## 2015-09-20 DIAGNOSIS — J301 Allergic rhinitis due to pollen: Secondary | ICD-10-CM | POA: Diagnosis not present

## 2015-09-20 DIAGNOSIS — J3081 Allergic rhinitis due to animal (cat) (dog) hair and dander: Secondary | ICD-10-CM | POA: Diagnosis not present

## 2015-09-20 DIAGNOSIS — J3089 Other allergic rhinitis: Secondary | ICD-10-CM | POA: Diagnosis not present

## 2015-09-20 DIAGNOSIS — Z79899 Other long term (current) drug therapy: Secondary | ICD-10-CM

## 2015-09-20 NOTE — Progress Notes (Signed)
Pre visit review using our clinic review tool, if applicable. No additional management support is needed unless otherwise documented below in the visit note.  Chief Complaint  Patient presents with  . Medicare Wellness    HPI: [name] 82 y.o. comes in today for Preventive Medicare wellness visit . And Chronic disease management   Bp : hard  Taking med  Asthma : see dr Toni Amend today Breast cancer: sees dr Marlou Starks every 6 months  voltaren  ocasx not on a regular basis  Edema legs the same  Swells  See notes and consults over the years   Not resonsove to diuretics in the past.  Worse with travel . Not using compression stocking  ocass use of prilosec no gi bleeding  Health Maintenance  Topic Date Due  . ZOSTAVAX  06/08/1992  . TETANUS/TDAP  07/22/2011  . INFLUENZA VACCINE  10/04/2015  . DEXA SCAN  Completed  . 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 Last eye check UTD fine   Safety:  Has smoke detector and wears seat belts.  No firearms. No excess sun exposure. Sees dentist regularly.  Falls: no  Advance directive :  Reviewed  Has one.  Memory: Felt to be good  , no concern from her or her family.  Depression: No anhedonia unusual crying or depressive symptoms  Nutrition: Eats well balanced diet; adequate calcium and vitamin D. No swallowing chewing problems.  Injury: no major injuries in the last six months.  Other healthcare providers:  Reviewed today .  Social:  Lives alone widowed . No pets.  Just  Moved to apt   Preventive parameters: up-to-date  Reviewed   ADLS:   There are no problems or need for assistance  driving, feeding,  obtaining food, dressing, toileting and bathing, managing money using phone. She is independent.     ROS:   GEN/ HEENT: No fever, significant weight changes sweats headaches vision problems hearing changes, CV/ PULM; No chest pain shortness of breath cough, syncope,edema  change in exercise tolerance. GI /GU: No adominal pain, vomiting, change in bowel habits. No blood in the stool. No significant GU symptoms. SKIN/HEME: ,no acute skin rashes suspicious lesions or bleeding. No lymphadenopathy, nodules, masses.  NEURO/ PSYCH:  No neurologic signs such as weakness numbness. No depression anxiety. IMM/ Allergy: No unusual infections.  Allergy .   REST of 12 system review negative except as per HPI   Past Medical History  Diagnosis Date  . Allergy   . Asthma   . GERD (gastroesophageal reflux disease)   . Hyperlipidemia   . Hypertension   . Anemia 2011    Since last visit she has had a Gi evaluation for newer onset Iron deficiiency anemia that was felt secondary to avm in duodenum rx with laser ablation.  . Cancer ([place])     rt breast  . Arthritis     in back    Family History  Problem Relation Age of Onset  . Cancer Mother     breast and ovarian    Social History  Social History  . Marital Status: Widowed    Spouse Name: N/A  . Number of Children: N/A  . Years of Education: N/A   Social History Main Topics  . Smoking status: Former Smoker -- 0.25 packs/day for 4 years    Types: Cigarettes    Quit date: 03/05/1968  . Smokeless tobacco: Never Used  . Alcohol Use: No  . Drug Use: No  . Sexual Activity: Not Asked   Other Topics Concern  . None   Social History Narrative   Widowed 2001    Ex smoker   HHof 1    Active in church and volunteer activities    [place] a few times per week   Swimming 2 x per week.    Tobacco 49=58 less than 1ppd.              Outpatient Encounter Prescriptions as of 09/20/2015  Medication Sig  . amLODipine (NORVASC) 10 MG tablet  TAKE 1 TABLET (10 MG TOTAL) DAILY  . budesonide-formoterol (SYMBICORT) 160-4.5 MCG/ACT inhaler Inhale 2 puffs into the lungs 2 (two) times daily as needed (Shortness of breath).   . cycloSPORINE (RESTASIS) 0.05 % ophthalmic emulsion Place 1 drop into both eyes 2 (two) times daily.  . diclofenac (VOLTAREN) 50 MG EC tablet Take 1 tablet by mouth daily.  Marland Kitchen EPIPEN 2-PAK 0.3 MG/0.3ML SOAJ injection INJ UTD PRN  . ferrous sulfate 325 (65 FE) MG tablet Take 325 mg by mouth daily with breakfast.  . KLOR-CON M20 20 MEQ tablet TAKE 1 TABLET TWICE A DAY  . lisinopril-hydrochlorothiazide (PRINZIDE,ZESTORETIC) 20-12.5 MG per tablet Take 1 tablet by mouth 2 (two) times daily.  . Magnesium 500 MG CAPS Take 500 mg by mouth daily.  . montelukast (SINGULAIR) 10 MG tablet Take 10 mg by mouth at bedtime.  Marland Kitchen omeprazole (PRILOSEC) 20 MG capsule Take 1 capsule (20 mg total) by mouth daily as needed (Acid Reflux).  . simvastatin (ZOCOR) 20 MG tablet Take 1 tablet (20 mg total) by mouth at bedtime.  . solifenacin (VESICARE) 5 MG tablet Take 1 tablet (5 mg total) by mouth daily.  . [DISCONTINUED] amoxicillin (AMOXIL) 500 MG capsule Take 500 mg by mouth once. Reported on 04/06/2015   No facility-administered encounter medications on file as of 09/20/2015.    EXAM:  BP 114/70 mmHg  Temp(Src) 98.6 F (37 C) (Oral)  Ht 4' 11.75" (1.518 m)  Wt 152 lb 1.6 oz (68.992 kg)  BMI 29.94 kg/m2  Body mass index is 29.94 kg/(m^2).  Physical Exam: Vital signs reviewed GUR:KYHC is a well-developed well-nourished alert cooperative   who appears stated age in no acute distress.  HEENT: normocephalic atraumatic , Eyes: PERRL EOM's full, conjunctiva clear, Nares: paten,t no deformity discharge or tenderness., Ears: no deformity EAC's clear TMs with normal landmarks. Mouth: clear OP, no lesions, edema  Some white pnd noted .  Moist mucous membranes. Dentition in adequate repair. NECK: supple without masses, thyromegaly or  bruits. CHEST/PULM:  Clear to auscultation and percussion breath sounds equal no wheeze , rales or rhonchi.  CV: PMI is nondisplaced, S1 S2 no gallops, murmurs, rubs. Peripheral pulses are present without delay.No JVD . Breast: no masses changes in right breast  Normal axilla ABDOMEN: Bowel sounds normal nontender  No guard or rebound, no hepato splenomegal no CVA tenderness.   Extremtities:  No clubbing cyanosis   2+ ankle  edema pitting ? edema, no acute joint swelling or redness no focal atrophy NEURO:  Oriented x3,  cranial nerves 3-12 appear to be intact, no obvious focal weakness,gait within normal limits no abnormal reflexes or asymmetrical SKIN: No acute rashes normal turgor, color, no bruising or petechiae. PSYCH: Oriented, good eye contact, no obvious depression anxiety, cognition and judgment appear normal. LN: no cervical axillary inguinal adenopathy No noted deficits in memory, attention, and speech.   Lab Results  Component Value Date   WBC 9.0 09/02/2014   HGB 13.0 09/02/2014   HCT 40.0 09/02/2014   PLT 311.0 09/02/2014   GLUCOSE 86 04/06/2015   CHOL 183 09/02/2014   TRIG 108.0 09/02/2014   HDL 60.70 09/02/2014   LDLCALC 101* 09/02/2014   ALT 13 09/02/2014   AST 16 09/02/2014   NA 142 04/06/2015   K 4.0 04/06/2015   CL 101 04/06/2015   CREATININE 0.68 04/06/2015   BUN 9 04/06/2015   CO2 30 04/06/2015   TSH 0.43 09/02/2014   BP Readings from Last 3 Encounters:  09/20/15 114/70  04/06/15 156/80  09/10/14 126/74   Wt Readings from Last 3 Encounters:  09/20/15 152 lb 1.6 oz (68.992 kg)  04/06/15 155 lb (70.308 kg)  09/10/14 152 lb 8 oz (69.174 kg)     ASSESSMENT AND PLAN:  Discussed the following assessment and plan:  Medicare annual wellness visit, subsequent  Essential hypertension - Plan: Basic metabolic panel, Lipid panel, TSH, Hepatic function panel  Medication management - Plan: Basic metabolic panel, Lipid panel, TSH, Hepatic function  panel  Hyperlipidemia - Plan: Basic metabolic panel, Lipid panel, TSH, Hepatic function panel  HX: breast cancer - Plan: Basic metabolic panel, Lipid panel, TSH, Hepatic function panel  Edema, unspecified type - prev eval unresponsive to diuretics disc use compression with travel given rx of 20 - 30 compression stokincg  - Plan: Basic metabolic panel, Lipid panel, TSH, Hepatic function panel Pt decline blood work today  And ok to wait until February to monitor  For bmp tsh and lipids  Med disease state evaluation   And HCM discussed  Shared Decision Making disc caution with prilosec Patient Care Team: Madelin Headings, MD as PCP - General (Internal Medicine) Chevis Pretty III, MD (General Surgery) Sidney Ace, MD (Allergy) Derinda Sis, MD as Referring Physician (Rheumatology)  Patient Instructions  Try using the compression hose first thing in am when you travel.  Continue activity  As you are doing.  Need lab Apr 05 2016 or thereabouts  Will pu in orders but you make lab appt  Otherwise  Yearly wellness med check  Let us know if BP is too low and we can adjust medications.     Health Maintenance, Female Adopting a healthy lifestyle and getting preventive care can go a long way to promote health and wellness. Talk with your health care provider about what schedule of regular examinations is right for you. This is a good chance for you to check in with your provider about disease prevention and staying healthy. In between checkups, there are plenty of things you can do on your own. Experts have done a lot of research about which lifestyle changes and preventive measures are most likely to keep you healthy. Ask your health care provider for more information. WEIGHT AND DIET  Eat a healthy diet  Be sure to include plenty of vegetables, fruits, low-fat dairy products, and lean protein.  Do not eat a lot of foods high in solid fats, added sugars, or salt.  Get regular exercise. This is  one of the most important  things you can do for your health.  Most adults should exercise for at least 150 minutes each week. The exercise should increase your heart rate and make you sweat (moderate-intensity exercise).  Most adults should also do strengthening exercises at least twice a week. This is in addition to the moderate-intensity exercise.  Maintain a healthy weight  Body mass index (BMI) is a measurement that can be used to identify possible weight problems. It estimates body fat based on height and weight. Your health care provider can help determine your BMI and help you achieve or maintain a healthy weight.  For females 18 years of age and older:   A BMI below 18.5 is considered underweight.  A BMI of 18.5 to 24.9 is normal.  A BMI of 25 to 29.9 is considered overweight.  A BMI of 30 and above is considered obese.  Watch levels of cholesterol and blood lipids  You should start having your blood tested for lipids and cholesterol at 80 years of age, then have this test every 5 years.  You may need to have your cholesterol levels checked more often if:  Your lipid or cholesterol levels are high.  You are older than 80 years of age.  You are at high risk for heart disease.  CANCER SCREENING   Lung Cancer  Lung cancer screening is recommended for adults 58-38 years old who are at high risk for lung cancer because of a history of smoking.  A yearly low-dose CT scan of the lungs is recommended for people who:  Currently smoke.  Have quit within the past 15 years.  Have at least a 30-pack-year history of smoking. A pack year is smoking an average of one pack of cigarettes a day for 1 year.  Yearly screening should continue until it has been 15 years since you quit.  Yearly screening should stop if you develop a health problem that would prevent you from having lung cancer treatment.  Breast Cancer  Practice breast self-awareness. This means understanding  how your breasts normally appear and feel.  It also means doing regular breast self-exams. Let your health care provider know about any changes, no matter how small.  If you are in your 20s or 30s, you should have a clinical breast exam (CBE) by a health care provider every 1-3 years as part of a regular health exam.  If you are 69 or older, have a CBE every year. Also consider having a breast X-ray (mammogram) every year.  If you have a family history of breast cancer, talk to your health care provider about genetic screening.  If you are at high risk for breast cancer, talk to your health care provider about having an MRI and a mammogram every year.  Breast cancer gene (BRCA) assessment is recommended for women who have family members with BRCA-related cancers. BRCA-related cancers include:  Breast.  Ovarian.  Tubal.  Peritoneal cancers.  Results of the assessment will determine the need for genetic counseling and BRCA1 and BRCA2 testing. Cervical Cancer Your health care provider may recommend that you be screened regularly for cancer of the pelvic organs (ovaries, uterus, and vagina). This screening involves a pelvic examination, including checking for microscopic changes to the surface of your cervix (Pap test). You may be encouraged to have this screening done every 3 years, beginning at age 68.  For women ages 72-65, health care providers may recommend pelvic exams and Pap testing every 3 years, or they may recommend the  Pap and pelvic exam, combined with testing for human papilloma virus (HPV), every 5 years. Some types of HPV increase your risk of cervical cancer. Testing for HPV may also be done on women of any age with unclear Pap test results.  Other health care providers may not recommend any screening for nonpregnant women who are considered low risk for pelvic cancer and who do not have symptoms. Ask your health care provider if a screening pelvic exam is right for  you.  If you have had past treatment for cervical cancer or a condition that could lead to cancer, you need Pap tests and screening for cancer for at least 20 years after your treatment. If Pap tests have been discontinued, your risk factors (such as having a new sexual partner) need to be reassessed to determine if screening should resume. Some women have medical problems that increase the chance of getting cervical cancer. In these cases, your health care provider may recommend more frequent screening and Pap tests. Colorectal Cancer  This type of cancer can be detected and often prevented.  Routine colorectal cancer screening usually begins at 80 years of age and continues through 80 years of age.  Your health care provider may recommend screening at an earlier age if you have risk factors for colon cancer.  Your health care provider may also recommend using home test kits to check for hidden blood in the stool.  A small camera at the end of a tube can be used to examine your colon directly (sigmoidoscopy or colonoscopy). This is done to check for the earliest forms of colorectal cancer.  Routine screening usually begins at age 70.  Direct examination of the colon should be repeated every 5-10 years through 80 years of age. However, you may need to be screened more often if early forms of precancerous polyps or small growths are found. Skin Cancer  Check your skin from head to toe regularly.  Tell your health care provider about any new moles or changes in moles, especially if there is a change in a mole's shape or color.  Also tell your health care provider if you have a mole that is larger than the size of a pencil eraser.  Always use sunscreen. Apply sunscreen liberally and repeatedly throughout the day.  Protect yourself by wearing long sleeves, pants, a wide-brimmed hat, and sunglasses whenever you are outside. HEART DISEASE, DIABETES, AND HIGH BLOOD PRESSURE   High blood  pressure causes heart disease and increases the risk of stroke. High blood pressure is more likely to develop in:  People who have blood pressure in the high end of the normal range (130-139/85-89 mm Hg).  People who are overweight or obese.  People who are African American.  If you are 50-62 years of age, have your blood pressure checked every 3-5 years. If you are 52 years of age or older, have your blood pressure checked every year. You should have your blood pressure measured twice--once when you are at a hospital or clinic, and once when you are not at a hospital or clinic. Record the average of the two measurements. To check your blood pressure when you are not at a hospital or clinic, you can use:  An automated blood pressure machine at a pharmacy.  A home blood pressure monitor.  If you are between 27 years and 63 years old, ask your health care provider if you should take aspirin to prevent strokes.  Have regular diabetes screenings. This involves  taking a blood sample to check your fasting blood sugar level.  If you are at a normal weight and have a low risk for diabetes, have this test once every three years after 80 years of age.  If you are overweight and have a high risk for diabetes, consider being tested at a younger age or more often. PREVENTING INFECTION  Hepatitis B  If you have a higher risk for hepatitis B, you should be screened for this virus. You are considered at high risk for hepatitis B if:  You were born in a country where hepatitis B is common. Ask your health care provider which countries are considered high risk.  Your parents were born in a high-risk country, and you have not been immunized against hepatitis B (hepatitis B vaccine).  You have HIV or AIDS.  You use needles to inject street drugs.  You live with someone who has hepatitis B.  You have had sex with someone who has hepatitis B.  You get hemodialysis treatment.  You take certain  medicines for conditions, including cancer, organ transplantation, and autoimmune conditions. Hepatitis C  Blood testing is recommended for:  Everyone born from [birth year] through 1963.  Anyone with known risk factors for hepatitis C. Sexually transmitted infections (STIs)  You should be screened for sexually transmitted infections (STIs) including gonorrhea and chlamydia if:  You are sexually active and are younger than 80 years of age.  You are older than 80 years of age and your health care provider tells you that you are at risk for this type of infection.  Your sexual activity has changed since you were last screened and you are at an increased risk for chlamydia or gonorrhea. Ask your health care provider if you are at risk.  If you do not have HIV, but are at risk, it may be recommended that you take a prescription medicine daily to prevent HIV infection. This is called pre-exposure prophylaxis (PrEP). You are considered at risk if:  You are sexually active and do not regularly use condoms or know the HIV status of your partner(s).  You take drugs by injection.  You are sexually active with a partner who has HIV. Talk with your health care provider about whether you are at high risk of being infected with HIV. If you choose to begin PrEP, you should first be tested for HIV. You should then be tested every 3 months for as long as you are taking PrEP.  PREGNANCY   If you are premenopausal and you may become pregnant, ask your health care provider about preconception counseling.  If you may become pregnant, take 400 to 800 micrograms (mcg) of folic acid every day.  If you want to prevent pregnancy, talk to your health care provider about birth control (contraception). OSTEOPOROSIS AND MENOPAUSE   Osteoporosis is a disease in which the bones lose minerals and strength with aging. This can result in serious bone fractures. Your risk for osteoporosis can be identified using a bone  density scan.  If you are 5 years of age or older, or if you are at risk for osteoporosis and fractures, ask your health care provider if you should be screened.  Ask your health care provider whether you should take a calcium or vitamin D supplement to lower your risk for osteoporosis.  Menopause may have certain physical symptoms and risks.  Hormone replacement therapy may reduce some of these symptoms and risks. Talk to your health care provider about whether  hormone replacement therapy is right for you.  HOME CARE INSTRUCTIONS   Schedule regular health, dental, and eye exams.  Stay current with your immunizations.   Do not use any tobacco products including cigarettes, chewing tobacco, or electronic cigarettes.  If you are pregnant, do not drink alcohol.  If you are breastfeeding, limit how much and how often you drink alcohol.  Limit alcohol intake to no more than 1 drink per day for nonpregnant women. One drink equals 12 ounces of beer, 5 ounces of wine, or 1 ounces of hard liquor.  Do not use street drugs.  Do not share needles.  Ask your health care provider for help if you need support or information about quitting drugs.  Tell your health care provider if you often feel depressed.  Tell your health care provider if you have ever been abused or do not feel safe at home.   This information is not intended to replace advice given to you by your health care provider. Make sure you discuss any questions you have with your health care provider.   Document Released: 09/04/2010 Document Revised: 03/12/2014 Document Reviewed: 01/21/2013 Elsevier Interactive Patient Education 2016 [place] K. Panosh M.D.

## 2015-09-20 NOTE — Patient Instructions (Addendum)
Try using the compression hose first thing in am when you travel.  Continue activity  As you are doing.  Need lab Apr 05 2016 or thereabouts  Will pu in orders but you make lab appt  Otherwise  Yearly wellness med check  Let us know if BP is too low and we can adjust medications.     Health Maintenance, Female Adopting a healthy lifestyle and getting preventive care can go a long way to promote health and wellness. Talk with your health care provider about what schedule of regular examinations is right for you. This is a good chance for you to check in with your provider about disease prevention and staying healthy. In between checkups, there are plenty of things you can do on your own. Experts have done a lot of research about which lifestyle changes and preventive measures are most likely to keep you healthy. Ask your health care provider for more information. WEIGHT AND DIET  Eat a healthy diet  Be sure to include plenty of vegetables, fruits, low-fat dairy products, and lean protein.  Do not eat a lot of foods high in solid fats, added sugars, or salt.  Get regular exercise. This is one of the most important things you can do for your health.  Most adults should exercise for at least 150 minutes each week. The exercise should increase your heart rate and make you sweat (moderate-intensity exercise).  Most adults should also do strengthening exercises at least twice a week. This is in addition to the moderate-intensity exercise.  Maintain a healthy weight  Body mass index (BMI) is a measurement that can be used to identify possible weight problems. It estimates body fat based on height and weight. Your health care provider can help determine your BMI and help you achieve or maintain a healthy weight.  For females 38 years of age and older:   A BMI below 18.5 is considered underweight.  A BMI of 18.5 to 24.9 is normal.  A BMI of 25 to 29.9 is considered overweight.  A BMI of 30  and above is considered obese.  Watch levels of cholesterol and blood lipids  You should start having your blood tested for lipids and cholesterol at 80 years of age, then have this test every 5 years.  You may need to have your cholesterol levels checked more often if:  Your lipid or cholesterol levels are high.  You are older than 80 years of age.  You are at high risk for heart disease.  CANCER SCREENING   Lung Cancer  Lung cancer screening is recommended for adults 10-30 years old who are at high risk for lung cancer because of a history of smoking.  A yearly low-dose CT scan of the lungs is recommended for people who:  Currently smoke.  Have quit within the past 15 years.  Have at least a 30-pack-year history of smoking. A pack year is smoking an average of one pack of cigarettes a day for 1 year.  Yearly screening should continue until it has been 15 years since you quit.  Yearly screening should stop if you develop a health problem that would prevent you from having lung cancer treatment.  Breast Cancer  Practice breast self-awareness. This means understanding how your breasts normally appear and feel.  It also means doing regular breast self-exams. Let your health care provider know about any changes, no matter how small.  If you are in your 20s or 30s, you should have  a clinical breast exam (CBE) by a health care provider every 1-3 years as part of a regular health exam.  If you are 62 or older, have a CBE every year. Also consider having a breast X-ray (mammogram) every year.  If you have a family history of breast cancer, talk to your health care provider about genetic screening.  If you are at high risk for breast cancer, talk to your health care provider about having an MRI and a mammogram every year.  Breast cancer gene (BRCA) assessment is recommended for women who have family members with BRCA-related cancers. BRCA-related cancers  include:  Breast.  Ovarian.  Tubal.  Peritoneal cancers.  Results of the assessment will determine the need for genetic counseling and BRCA1 and BRCA2 testing. Cervical Cancer Your health care provider may recommend that you be screened regularly for cancer of the pelvic organs (ovaries, uterus, and vagina). This screening involves a pelvic examination, including checking for microscopic changes to the surface of your cervix (Pap test). You may be encouraged to have this screening done every 3 years, beginning at age 27.  For women ages 85-65, health care providers may recommend pelvic exams and Pap testing every 3 years, or they may recommend the Pap and pelvic exam, combined with testing for human papilloma virus (HPV), every 5 years. Some types of HPV increase your risk of cervical cancer. Testing for HPV may also be done on women of any age with unclear Pap test results.  Other health care providers may not recommend any screening for nonpregnant women who are considered low risk for pelvic cancer and who do not have symptoms. Ask your health care provider if a screening pelvic exam is right for you.  If you have had past treatment for cervical cancer or a condition that could lead to cancer, you need Pap tests and screening for cancer for at least 20 years after your treatment. If Pap tests have been discontinued, your risk factors (such as having a new sexual partner) need to be reassessed to determine if screening should resume. Some women have medical problems that increase the chance of getting cervical cancer. In these cases, your health care provider may recommend more frequent screening and Pap tests. Colorectal Cancer  This type of cancer can be detected and often prevented.  Routine colorectal cancer screening usually begins at 80 years of age and continues through 80 years of age.  Your health care provider may recommend screening at an earlier age if you have risk factors for  colon cancer.  Your health care provider may also recommend using home test kits to check for hidden blood in the stool.  A small camera at the end of a tube can be used to examine your colon directly (sigmoidoscopy or colonoscopy). This is done to check for the earliest forms of colorectal cancer.  Routine screening usually begins at age 39.  Direct examination of the colon should be repeated every 5-10 years through 80 years of age. However, you may need to be screened more often if early forms of precancerous polyps or small growths are found. Skin Cancer  Check your skin from head to toe regularly.  Tell your health care provider about any new moles or changes in moles, especially if there is a change in a mole's shape or color.  Also tell your health care provider if you have a mole that is larger than the size of a pencil eraser.  Always use sunscreen. Apply sunscreen  liberally and repeatedly throughout the day.  Protect yourself by wearing long sleeves, pants, a wide-brimmed hat, and sunglasses whenever you are outside. HEART DISEASE, DIABETES, AND HIGH BLOOD PRESSURE   High blood pressure causes heart disease and increases the risk of stroke. High blood pressure is more likely to develop in:  People who have blood pressure in the high end of the normal range (130-139/85-89 mm Hg).  People who are overweight or obese.  People who are African American.  If you are 40-36 years of age, have your blood pressure checked every 3-5 years. If you are 99 years of age or older, have your blood pressure checked every year. You should have your blood pressure measured twice--once when you are at a hospital or clinic, and once when you are not at a hospital or clinic. Record the average of the two measurements. To check your blood pressure when you are not at a hospital or clinic, you can use:  An automated blood pressure machine at a pharmacy.  A home blood pressure monitor.  If you  are between 28 years and 66 years old, ask your health care provider if you should take aspirin to prevent strokes.  Have regular diabetes screenings. This involves taking a blood sample to check your fasting blood sugar level.  If you are at a normal weight and have a low risk for diabetes, have this test once every three years after 80 years of age.  If you are overweight and have a high risk for diabetes, consider being tested at a younger age or more often. PREVENTING INFECTION  Hepatitis B  If you have a higher risk for hepatitis B, you should be screened for this virus. You are considered at high risk for hepatitis B if:  You were born in a country where hepatitis B is common. Ask your health care provider which countries are considered high risk.  Your parents were born in a high-risk country, and you have not been immunized against hepatitis B (hepatitis B vaccine).  You have HIV or AIDS.  You use needles to inject street drugs.  You live with someone who has hepatitis B.  You have had sex with someone who has hepatitis B.  You get hemodialysis treatment.  You take certain medicines for conditions, including cancer, organ transplantation, and autoimmune conditions. Hepatitis C  Blood testing is recommended for:  Everyone born from [birth year] through 1963.  Anyone with known risk factors for hepatitis C. Sexually transmitted infections (STIs)  You should be screened for sexually transmitted infections (STIs) including gonorrhea and chlamydia if:  You are sexually active and are younger than 80 years of age.  You are older than 80 years of age and your health care provider tells you that you are at risk for this type of infection.  Your sexual activity has changed since you were last screened and you are at an increased risk for chlamydia or gonorrhea. Ask your health care provider if you are at risk.  If you do not have HIV, but are at risk, it may be recommended that you  take a prescription medicine daily to prevent HIV infection. This is called pre-exposure prophylaxis (PrEP). You are considered at risk if:  You are sexually active and do not regularly use condoms or know the HIV status of your partner(s).  You take drugs by injection.  You are sexually active with a partner who has HIV. Talk with your health care provider about whether you  are at high risk of being infected with HIV. If you choose to begin PrEP, you should first be tested for HIV. You should then be tested every 3 months for as long as you are taking PrEP.  PREGNANCY   If you are premenopausal and you may become pregnant, ask your health care provider about preconception counseling.  If you may become pregnant, take 400 to 800 micrograms (mcg) of folic acid every day.  If you want to prevent pregnancy, talk to your health care provider about birth control (contraception). OSTEOPOROSIS AND MENOPAUSE   Osteoporosis is a disease in which the bones lose minerals and strength with aging. This can result in serious bone fractures. Your risk for osteoporosis can be identified using a bone density scan.  If you are 68 years of age or older, or if you are at risk for osteoporosis and fractures, ask your health care provider if you should be screened.  Ask your health care provider whether you should take a calcium or vitamin D supplement to lower your risk for osteoporosis.  Menopause may have certain physical symptoms and risks.  Hormone replacement therapy may reduce some of these symptoms and risks. Talk to your health care provider about whether hormone replacement therapy is right for you.  HOME CARE INSTRUCTIONS   Schedule regular health, dental, and eye exams.  Stay current with your immunizations.   Do not use any tobacco products including cigarettes, chewing tobacco, or electronic cigarettes.  If you are pregnant, do not drink alcohol.  If you are breastfeeding, limit how  much and how often you drink alcohol.  Limit alcohol intake to no more than 1 drink per day for nonpregnant women. One drink equals 12 ounces of beer, 5 ounces of wine, or 1 ounces of hard liquor.  Do not use street drugs.  Do not share needles.  Ask your health care provider for help if you need support or information about quitting drugs.  Tell your health care provider if you often feel depressed.  Tell your health care provider if you have ever been abused or do not feel safe at home.   This information is not intended to replace advice given to you by your health care provider. Make sure you discuss any questions you have with your health care provider.   Document Released: 09/04/2010 Document Revised: 03/12/2014 Document Reviewed: 01/21/2013 Elsevier Interactive Patient Education [employer].

## 2015-09-26 DIAGNOSIS — J3089 Other allergic rhinitis: Secondary | ICD-10-CM | POA: Diagnosis not present

## 2015-09-26 DIAGNOSIS — J3081 Allergic rhinitis due to animal (cat) (dog) hair and dander: Secondary | ICD-10-CM | POA: Diagnosis not present

## 2015-09-26 DIAGNOSIS — J301 Allergic rhinitis due to pollen: Secondary | ICD-10-CM | POA: Diagnosis not present

## 2015-10-03 DIAGNOSIS — J3081 Allergic rhinitis due to animal (cat) (dog) hair and dander: Secondary | ICD-10-CM | POA: Diagnosis not present

## 2015-10-03 DIAGNOSIS — J3089 Other allergic rhinitis: Secondary | ICD-10-CM | POA: Diagnosis not present

## 2015-10-03 DIAGNOSIS — J301 Allergic rhinitis due to pollen: Secondary | ICD-10-CM | POA: Diagnosis not present

## 2015-10-10 DIAGNOSIS — J301 Allergic rhinitis due to pollen: Secondary | ICD-10-CM | POA: Diagnosis not present

## 2015-10-10 DIAGNOSIS — J3081 Allergic rhinitis due to animal (cat) (dog) hair and dander: Secondary | ICD-10-CM | POA: Diagnosis not present

## 2015-10-10 DIAGNOSIS — J3089 Other allergic rhinitis: Secondary | ICD-10-CM | POA: Diagnosis not present

## 2015-10-12 DIAGNOSIS — J3081 Allergic rhinitis due to animal (cat) (dog) hair and dander: Secondary | ICD-10-CM | POA: Diagnosis not present

## 2015-10-12 DIAGNOSIS — J3089 Other allergic rhinitis: Secondary | ICD-10-CM | POA: Diagnosis not present

## 2015-10-17 DIAGNOSIS — J3081 Allergic rhinitis due to animal (cat) (dog) hair and dander: Secondary | ICD-10-CM | POA: Diagnosis not present

## 2015-10-17 DIAGNOSIS — J301 Allergic rhinitis due to pollen: Secondary | ICD-10-CM | POA: Diagnosis not present

## 2015-10-17 DIAGNOSIS — J3089 Other allergic rhinitis: Secondary | ICD-10-CM | POA: Diagnosis not present

## 2015-10-24 DIAGNOSIS — J3081 Allergic rhinitis due to animal (cat) (dog) hair and dander: Secondary | ICD-10-CM | POA: Diagnosis not present

## 2015-10-24 DIAGNOSIS — J3089 Other allergic rhinitis: Secondary | ICD-10-CM | POA: Diagnosis not present

## 2015-10-24 DIAGNOSIS — J301 Allergic rhinitis due to pollen: Secondary | ICD-10-CM | POA: Diagnosis not present

## 2015-10-26 DIAGNOSIS — R600 Localized edema: Secondary | ICD-10-CM | POA: Diagnosis not present

## 2015-10-31 DIAGNOSIS — J3089 Other allergic rhinitis: Secondary | ICD-10-CM | POA: Diagnosis not present

## 2015-10-31 DIAGNOSIS — J301 Allergic rhinitis due to pollen: Secondary | ICD-10-CM | POA: Diagnosis not present

## 2015-10-31 DIAGNOSIS — J3081 Allergic rhinitis due to animal (cat) (dog) hair and dander: Secondary | ICD-10-CM | POA: Diagnosis not present

## 2015-11-08 DIAGNOSIS — J3089 Other allergic rhinitis: Secondary | ICD-10-CM | POA: Diagnosis not present

## 2015-11-08 DIAGNOSIS — J3081 Allergic rhinitis due to animal (cat) (dog) hair and dander: Secondary | ICD-10-CM | POA: Diagnosis not present

## 2015-11-08 DIAGNOSIS — J301 Allergic rhinitis due to pollen: Secondary | ICD-10-CM | POA: Diagnosis not present

## 2015-11-16 DIAGNOSIS — J301 Allergic rhinitis due to pollen: Secondary | ICD-10-CM | POA: Diagnosis not present

## 2015-11-16 DIAGNOSIS — J3089 Other allergic rhinitis: Secondary | ICD-10-CM | POA: Diagnosis not present

## 2015-11-16 DIAGNOSIS — J3081 Allergic rhinitis due to animal (cat) (dog) hair and dander: Secondary | ICD-10-CM | POA: Diagnosis not present

## 2015-11-22 DIAGNOSIS — J301 Allergic rhinitis due to pollen: Secondary | ICD-10-CM | POA: Diagnosis not present

## 2015-11-22 DIAGNOSIS — J3089 Other allergic rhinitis: Secondary | ICD-10-CM | POA: Diagnosis not present

## 2015-11-22 DIAGNOSIS — J3081 Allergic rhinitis due to animal (cat) (dog) hair and dander: Secondary | ICD-10-CM | POA: Diagnosis not present

## 2015-11-30 DIAGNOSIS — J301 Allergic rhinitis due to pollen: Secondary | ICD-10-CM | POA: Diagnosis not present

## 2015-11-30 DIAGNOSIS — J3089 Other allergic rhinitis: Secondary | ICD-10-CM | POA: Diagnosis not present

## 2015-11-30 DIAGNOSIS — J3081 Allergic rhinitis due to animal (cat) (dog) hair and dander: Secondary | ICD-10-CM | POA: Diagnosis not present

## 2015-12-07 DIAGNOSIS — J3081 Allergic rhinitis due to animal (cat) (dog) hair and dander: Secondary | ICD-10-CM | POA: Diagnosis not present

## 2015-12-07 DIAGNOSIS — J301 Allergic rhinitis due to pollen: Secondary | ICD-10-CM | POA: Diagnosis not present

## 2015-12-07 DIAGNOSIS — J3089 Other allergic rhinitis: Secondary | ICD-10-CM | POA: Diagnosis not present

## 2015-12-14 DIAGNOSIS — J3081 Allergic rhinitis due to animal (cat) (dog) hair and dander: Secondary | ICD-10-CM | POA: Diagnosis not present

## 2015-12-14 DIAGNOSIS — J301 Allergic rhinitis due to pollen: Secondary | ICD-10-CM | POA: Diagnosis not present

## 2015-12-14 DIAGNOSIS — J3089 Other allergic rhinitis: Secondary | ICD-10-CM | POA: Diagnosis not present

## 2015-12-20 ENCOUNTER — Ambulatory Visit: Payer: Medicare Other | Admitting: Family Medicine

## 2015-12-20 DIAGNOSIS — J301 Allergic rhinitis due to pollen: Secondary | ICD-10-CM | POA: Diagnosis not present

## 2015-12-20 DIAGNOSIS — J3089 Other allergic rhinitis: Secondary | ICD-10-CM | POA: Diagnosis not present

## 2015-12-20 DIAGNOSIS — J3081 Allergic rhinitis due to animal (cat) (dog) hair and dander: Secondary | ICD-10-CM | POA: Diagnosis not present

## 2015-12-22 ENCOUNTER — Ambulatory Visit (INDEPENDENT_AMBULATORY_CARE_PROVIDER_SITE_OTHER): Payer: Medicare Other | Admitting: *Deleted

## 2015-12-22 DIAGNOSIS — Z23 Encounter for immunization: Secondary | ICD-10-CM | POA: Diagnosis not present

## 2015-12-27 DIAGNOSIS — J301 Allergic rhinitis due to pollen: Secondary | ICD-10-CM | POA: Diagnosis not present

## 2015-12-27 DIAGNOSIS — J3089 Other allergic rhinitis: Secondary | ICD-10-CM | POA: Diagnosis not present

## 2015-12-27 DIAGNOSIS — J3081 Allergic rhinitis due to animal (cat) (dog) hair and dander: Secondary | ICD-10-CM | POA: Diagnosis not present

## 2016-01-02 ENCOUNTER — Telehealth: Payer: Self-pay | Admitting: Internal Medicine

## 2016-01-02 NOTE — Telephone Encounter (Signed)
Spoke to the pt.  I informed her that Dr. Regis Bill has not prescribed this medication for her in the past.  She checked the medication bottle and stated that Dr. Dossie Der has been filling it.  She will call that office.

## 2016-01-02 NOTE — Telephone Encounter (Signed)
Pt needs new rx diclofenac 50 mg #90 w/refills send to [facility]

## 2016-01-03 ENCOUNTER — Other Ambulatory Visit: Payer: Self-pay | Admitting: Family Medicine

## 2016-01-03 DIAGNOSIS — J301 Allergic rhinitis due to pollen: Secondary | ICD-10-CM | POA: Diagnosis not present

## 2016-01-03 DIAGNOSIS — J3081 Allergic rhinitis due to animal (cat) (dog) hair and dander: Secondary | ICD-10-CM | POA: Diagnosis not present

## 2016-01-03 DIAGNOSIS — J3089 Other allergic rhinitis: Secondary | ICD-10-CM | POA: Diagnosis not present

## 2016-01-03 MED ORDER — LISINOPRIL-HYDROCHLOROTHIAZIDE 20-12.5 MG PO TABS: 1.0000 | ORAL_TABLET | Freq: Two times a day (BID) | ORAL | 1 refills | Status: DC

## 2016-01-03 MED ORDER — AMLODIPINE BESYLATE 10 MG PO TABS: ORAL_TABLET | ORAL | 1 refills | Status: DC

## 2016-01-03 NOTE — Telephone Encounter (Signed)
Received a fax request from [facility].  Filled for 6 months electronically.  Pt has made future lab appt for 04/05/16.

## 2016-01-03 NOTE — Addendum Note (Signed)
Addended by: Miles Costain T on: 01/03/2016 04:53 PM   Modules accepted: Orders

## 2016-01-03 NOTE — Telephone Encounter (Signed)
Amlodipine also sent electronically for 6 months.

## 2016-01-11 DIAGNOSIS — J3089 Other allergic rhinitis: Secondary | ICD-10-CM | POA: Diagnosis not present

## 2016-01-11 DIAGNOSIS — J301 Allergic rhinitis due to pollen: Secondary | ICD-10-CM | POA: Diagnosis not present

## 2016-01-11 DIAGNOSIS — J3081 Allergic rhinitis due to animal (cat) (dog) hair and dander: Secondary | ICD-10-CM | POA: Diagnosis not present

## 2016-01-18 DIAGNOSIS — J301 Allergic rhinitis due to pollen: Secondary | ICD-10-CM | POA: Diagnosis not present

## 2016-01-18 DIAGNOSIS — J3081 Allergic rhinitis due to animal (cat) (dog) hair and dander: Secondary | ICD-10-CM | POA: Diagnosis not present

## 2016-01-18 DIAGNOSIS — J3089 Other allergic rhinitis: Secondary | ICD-10-CM | POA: Diagnosis not present

## 2016-01-24 DIAGNOSIS — J3081 Allergic rhinitis due to animal (cat) (dog) hair and dander: Secondary | ICD-10-CM | POA: Diagnosis not present

## 2016-01-24 DIAGNOSIS — J301 Allergic rhinitis due to pollen: Secondary | ICD-10-CM | POA: Diagnosis not present

## 2016-01-24 DIAGNOSIS — J3089 Other allergic rhinitis: Secondary | ICD-10-CM | POA: Diagnosis not present

## 2016-02-01 DIAGNOSIS — J301 Allergic rhinitis due to pollen: Secondary | ICD-10-CM | POA: Diagnosis not present

## 2016-02-01 DIAGNOSIS — J3081 Allergic rhinitis due to animal (cat) (dog) hair and dander: Secondary | ICD-10-CM | POA: Diagnosis not present

## 2016-02-01 DIAGNOSIS — J3089 Other allergic rhinitis: Secondary | ICD-10-CM | POA: Diagnosis not present

## 2016-02-03 DIAGNOSIS — J301 Allergic rhinitis due to pollen: Secondary | ICD-10-CM | POA: Diagnosis not present

## 2016-02-07 DIAGNOSIS — J301 Allergic rhinitis due to pollen: Secondary | ICD-10-CM | POA: Diagnosis not present

## 2016-02-07 DIAGNOSIS — J3081 Allergic rhinitis due to animal (cat) (dog) hair and dander: Secondary | ICD-10-CM | POA: Diagnosis not present

## 2016-02-07 DIAGNOSIS — J3089 Other allergic rhinitis: Secondary | ICD-10-CM | POA: Diagnosis not present

## 2016-02-15 DIAGNOSIS — J3089 Other allergic rhinitis: Secondary | ICD-10-CM | POA: Diagnosis not present

## 2016-02-15 DIAGNOSIS — J301 Allergic rhinitis due to pollen: Secondary | ICD-10-CM | POA: Diagnosis not present

## 2016-02-15 DIAGNOSIS — J3081 Allergic rhinitis due to animal (cat) (dog) hair and dander: Secondary | ICD-10-CM | POA: Diagnosis not present

## 2016-02-21 DIAGNOSIS — J3089 Other allergic rhinitis: Secondary | ICD-10-CM | POA: Diagnosis not present

## 2016-02-21 DIAGNOSIS — J301 Allergic rhinitis due to pollen: Secondary | ICD-10-CM | POA: Diagnosis not present

## 2016-02-21 DIAGNOSIS — J3081 Allergic rhinitis due to animal (cat) (dog) hair and dander: Secondary | ICD-10-CM | POA: Diagnosis not present

## 2016-02-29 DIAGNOSIS — J301 Allergic rhinitis due to pollen: Secondary | ICD-10-CM | POA: Diagnosis not present

## 2016-02-29 DIAGNOSIS — J3081 Allergic rhinitis due to animal (cat) (dog) hair and dander: Secondary | ICD-10-CM | POA: Diagnosis not present

## 2016-02-29 DIAGNOSIS — J3089 Other allergic rhinitis: Secondary | ICD-10-CM | POA: Diagnosis not present

## 2016-03-07 DIAGNOSIS — J3089 Other allergic rhinitis: Secondary | ICD-10-CM | POA: Diagnosis not present

## 2016-03-07 DIAGNOSIS — J301 Allergic rhinitis due to pollen: Secondary | ICD-10-CM | POA: Diagnosis not present

## 2016-03-07 DIAGNOSIS — J3081 Allergic rhinitis due to animal (cat) (dog) hair and dander: Secondary | ICD-10-CM | POA: Diagnosis not present

## 2016-03-13 DIAGNOSIS — J3089 Other allergic rhinitis: Secondary | ICD-10-CM | POA: Diagnosis not present

## 2016-03-13 DIAGNOSIS — J3081 Allergic rhinitis due to animal (cat) (dog) hair and dander: Secondary | ICD-10-CM | POA: Diagnosis not present

## 2016-03-13 DIAGNOSIS — J301 Allergic rhinitis due to pollen: Secondary | ICD-10-CM | POA: Diagnosis not present

## 2016-03-15 ENCOUNTER — Other Ambulatory Visit: Payer: Self-pay | Admitting: General Surgery

## 2016-03-15 DIAGNOSIS — Z853 Personal history of malignant neoplasm of breast: Secondary | ICD-10-CM

## 2016-03-23 DIAGNOSIS — J3089 Other allergic rhinitis: Secondary | ICD-10-CM | POA: Diagnosis not present

## 2016-03-23 DIAGNOSIS — J301 Allergic rhinitis due to pollen: Secondary | ICD-10-CM | POA: Diagnosis not present

## 2016-03-23 DIAGNOSIS — J3081 Allergic rhinitis due to animal (cat) (dog) hair and dander: Secondary | ICD-10-CM | POA: Diagnosis not present

## 2016-03-26 DIAGNOSIS — J3089 Other allergic rhinitis: Secondary | ICD-10-CM | POA: Diagnosis not present

## 2016-03-26 DIAGNOSIS — J3081 Allergic rhinitis due to animal (cat) (dog) hair and dander: Secondary | ICD-10-CM | POA: Diagnosis not present

## 2016-03-27 DIAGNOSIS — J301 Allergic rhinitis due to pollen: Secondary | ICD-10-CM | POA: Diagnosis not present

## 2016-03-27 DIAGNOSIS — J3089 Other allergic rhinitis: Secondary | ICD-10-CM | POA: Diagnosis not present

## 2016-03-27 DIAGNOSIS — J3081 Allergic rhinitis due to animal (cat) (dog) hair and dander: Secondary | ICD-10-CM | POA: Diagnosis not present

## 2016-04-05 ENCOUNTER — Other Ambulatory Visit (INDEPENDENT_AMBULATORY_CARE_PROVIDER_SITE_OTHER): Payer: Medicare Other

## 2016-04-05 DIAGNOSIS — J301 Allergic rhinitis due to pollen: Secondary | ICD-10-CM | POA: Diagnosis not present

## 2016-04-05 DIAGNOSIS — Z853 Personal history of malignant neoplasm of breast: Secondary | ICD-10-CM | POA: Diagnosis not present

## 2016-04-05 DIAGNOSIS — J3089 Other allergic rhinitis: Secondary | ICD-10-CM | POA: Diagnosis not present

## 2016-04-05 DIAGNOSIS — I10 Essential (primary) hypertension: Secondary | ICD-10-CM

## 2016-04-05 DIAGNOSIS — R609 Edema, unspecified: Secondary | ICD-10-CM

## 2016-04-05 DIAGNOSIS — Z79899 Other long term (current) drug therapy: Secondary | ICD-10-CM

## 2016-04-05 DIAGNOSIS — J3081 Allergic rhinitis due to animal (cat) (dog) hair and dander: Secondary | ICD-10-CM | POA: Diagnosis not present

## 2016-04-05 DIAGNOSIS — E785 Hyperlipidemia, unspecified: Secondary | ICD-10-CM

## 2016-04-05 LAB — BASIC METABOLIC PANEL
BUN: 11 mg/dL (ref 6–23)
CO2: 32 mEq/L (ref 19–32)
Calcium: 9.4 mg/dL (ref 8.4–10.5)
Chloride: 103 mEq/L (ref 96–112)
Creatinine, Ser: 0.64 mg/dL (ref 0.40–1.20)
GFR: 113.74 mL/min (ref >60.00)
Glucose, Bld: 85 mg/dL (ref 70–99)
Potassium: 3.6 mEq/L (ref 3.5–5.1)
Sodium: 143 mEq/L (ref 135–145)

## 2016-04-05 LAB — LIPID PANEL
Cholesterol: 216 mg/dL — ABNORMAL HIGH (ref 0–200)
HDL: 61.3 mg/dL (ref >39.00)
LDL Cholesterol: 134 mg/dL — ABNORMAL HIGH (ref 0–99)
NonHDL: 154.81
Total CHOL/HDL Ratio: 4
Triglycerides: 105 mg/dL (ref 0.0–149.0)
VLDL: 21 mg/dL (ref 0.0–40.0)

## 2016-04-05 LAB — HEPATIC FUNCTION PANEL
ALT: 12 U/L (ref 0–35)
AST: 22 U/L (ref 0–37)
Albumin: 3.8 g/dL (ref 3.5–5.2)
Alkaline Phosphatase: 75 U/L (ref 39–117)
Bilirubin, Direct: 0.1 mg/dL (ref 0.0–0.3)
Total Bilirubin: 0.4 mg/dL (ref 0.2–1.2)
Total Protein: 6.8 g/dL (ref 6.0–8.3)

## 2016-04-05 LAB — TSH: TSH: 0.73 u[IU]/mL (ref 0.35–4.50)

## 2016-04-09 ENCOUNTER — Encounter: Payer: Self-pay | Admitting: Internal Medicine

## 2016-04-09 ENCOUNTER — Ambulatory Visit (INDEPENDENT_AMBULATORY_CARE_PROVIDER_SITE_OTHER): Payer: Medicare Other | Admitting: Internal Medicine

## 2016-04-09 VITALS — BP 118/68 | Temp 98.2°F | Ht 59.75 in | Wt 151.0 lb

## 2016-04-09 DIAGNOSIS — I10 Essential (primary) hypertension: Secondary | ICD-10-CM

## 2016-04-09 DIAGNOSIS — M79605 Pain in left leg: Secondary | ICD-10-CM

## 2016-04-09 DIAGNOSIS — E785 Hyperlipidemia, unspecified: Secondary | ICD-10-CM | POA: Diagnosis not present

## 2016-04-09 DIAGNOSIS — J301 Allergic rhinitis due to pollen: Secondary | ICD-10-CM | POA: Diagnosis not present

## 2016-04-09 DIAGNOSIS — Z79899 Other long term (current) drug therapy: Secondary | ICD-10-CM

## 2016-04-09 DIAGNOSIS — R1032 Left lower quadrant pain: Secondary | ICD-10-CM

## 2016-04-09 DIAGNOSIS — R609 Edema, unspecified: Secondary | ICD-10-CM

## 2016-04-09 DIAGNOSIS — J3081 Allergic rhinitis due to animal (cat) (dog) hair and dander: Secondary | ICD-10-CM | POA: Diagnosis not present

## 2016-04-09 DIAGNOSIS — J3089 Other allergic rhinitis: Secondary | ICD-10-CM | POA: Diagnosis not present

## 2016-04-09 NOTE — Patient Instructions (Addendum)
We will do  A referral to [facility]   About the groin hip leg pain that may be a hip problem   I dont hear a murmur today and your bp  is good . Will follow exam .  Consider  cardiology  If having  Any concerns   ROV  wellness visit in about 6 months or after .  Can make  wellness visit with Wynetta Fines our health coach and  physical and med labs appointments  with me.  Blood work  is stable good except cholesterol . Up some probably not taking  every day

## 2016-04-09 NOTE — Progress Notes (Signed)
Chief Complaint  Patient presents with  . Follow-up    HPI: [name] 83 y.o. come in for Chronic disease management   BP   Takes med reg and no se   bpt / up and down . On potassium  resp on inhalers daily Lipids takes med about 3 x per week ( on a lot of meds). Left leg vein problem and told she has a murmur thia year by vein specialist   No cp sob ocass ? twinge palp luq r very short no ass sd noted because  Has about 2 mos of  Left groin later mid leg pain to knee  Inc to walking    Impairing mobility and  Sometimes has to use cane  ROS: See pertinent positives and negatives per HPI. No syncope  Bleedings  Intermittent UI takes vesicare ocass when going out. Exercise program massage helped back  Son had cva  Was okff bl med  Past Medical History:  Diagnosis Date  . Allergy   . Anemia 2011   Since last visit she has had a Gi evaluation for newer onset Iron deficiiency anemia that was felt secondary to avm in duodenum rx with laser ablation.  . Arthritis    in back  . Asthma   . Cancer ([place])    rt breast  . GERD (gastroesophageal reflux disease)   . Hyperlipidemia   . Hypertension     Family History  Problem Relation Age of Onset  . Cancer Mother     breast and ovarian    Social History   Social History  . Marital status: Widowed    Spouse name: N/A  . Number of children: N/A  . Years of education: N/A   Social History Main Topics  . Smoking status: Former Smoker    Packs/day: 0.25    Years: 4.00    Types: Cigarettes    Quit date: 03/05/1968  . Smokeless tobacco: Never Used  . Alcohol use No  . Drug use: No  . Sexual activity: Not Asked   Other Topics Concern  . None   Social History Narrative   Widowed 2001    Ex smoker   HHof 1    Active in church and volunteer activities    [place] a few times per week   Swimming 2 x per week.    Tobacco 49=58 less than 1ppd.              Outpatient Medications Prior to Visit  Medication Sig  Dispense Refill  . amLODipine (NORVASC) 10 MG tablet TAKE 1 TABLET (10 MG TOTAL) DAILY 90 tablet 1  . budesonide-formoterol (SYMBICORT) 160-4.5 MCG/ACT inhaler Inhale 2 puffs into the lungs 2 (two) times daily as needed (Shortness of breath).     . cycloSPORINE (RESTASIS) 0.05 % ophthalmic emulsion Place 1 drop into both eyes 2 (two) times daily.    Marland Kitchen EPIPEN 2-PAK 0.3 MG/0.3ML SOAJ injection INJ UTD PRN  1  . ferrous sulfate 325 (65 FE) MG tablet Take 325 mg by mouth daily with breakfast.    . KLOR-CON M20 20 MEQ tablet TAKE 1 TABLET TWICE A DAY 180 tablet 0  . lisinopril-hydrochlorothiazide (PRINZIDE,ZESTORETIC) 20-12.5 MG tablet Take 1 tablet by mouth 2 (two) times daily. 180 tablet 1  . Magnesium 500 MG CAPS Take 500 mg by mouth daily.    . montelukast (SINGULAIR) 10 MG tablet Take 10 mg by mouth at bedtime.    Marland Kitchen omeprazole (PRILOSEC)  20 MG capsule Take 1 capsule (20 mg total) by mouth daily as needed (Acid Reflux). 90 capsule 2  . simvastatin (ZOCOR) 20 MG tablet Take 1 tablet (20 mg total) by mouth at bedtime. 90 tablet 3  . solifenacin (VESICARE) 5 MG tablet Take 1 tablet (5 mg total) by mouth daily. 90 tablet 3  . diclofenac (VOLTAREN) 50 MG EC tablet Take 1 tablet by mouth daily.  3   No facility-administered medications prior to visit.      EXAM:  BP 118/68 (BP Location: Right Arm, Patient Position: Sitting, Cuff Size: Normal)   Temp 98.2 F (36.8 C) (Oral)   Ht 4' 11.75" (1.518 m)   Wt 151 lb (68.5 kg)   BMI 29.74 kg/m   Body mass index is 29.74 kg/m.  GENERAL: vitals reviewed and listed above, alert, oriented, appears well hydrated and in no acute distress HEENT: atraumatic, conjunctiva  clear, no obvious abnormalities on inspection of external nose and ears OP : no lesion edema or exudate  NECK: no obvious masses on inspection palpation  LUNGS: clear to auscultation bilaterally, no wheezes, rales or rhonchi, good air movement milld kyphosis  CV: HRRR, no clubbing  cyanosis  1-2  peripheral edema nl cap refill Old  No murmur   Of signifcane heard sitting or laying postiiosn  abd soft no masses   Left groin tender and dec rom rotation of hip .  Can get up on table  But  Some discomfort   PSYCH: pleasant and cooperative, no obvious depression or anxiety Lab Results  Component Value Date   WBC 9.0 09/02/2014   HGB 13.0 09/02/2014   HCT 40.0 09/02/2014   PLT 311.0 09/02/2014   GLUCOSE 85 04/05/2016   CHOL 216 (H) 04/05/2016   TRIG 105.0 04/05/2016   HDL 61.30 04/05/2016   LDLCALC 134 (H) 04/05/2016   ALT 12 04/05/2016   AST 22 04/05/2016   NA 143 04/05/2016   K 3.6 04/05/2016   CL 103 04/05/2016   CREATININE 0.64 04/05/2016   BUN 11 04/05/2016   CO2 32 04/05/2016   TSH 0.73 04/05/2016   BP Readings from Last 3 Encounters:  04/09/16 118/68  09/20/15 114/70  04/06/15 (!) 156/80   Wt Readings from Last 3 Encounters:  04/09/16 151 lb (68.5 kg)  09/20/15 152 lb 1.6 oz (69 kg)  04/06/15 155 lb (70.3 kg)    ASSESSMENT AND PLAN:  Discussed the following assessment and plan:  Essential hypertension - controlled   Medication management - revewied meds risk benefits   Left leg pain - nwe onset  effecting mobility and exercise  walking etc  disc diff dx poss hip disease refer to [facility] for evaluation options - Plan: Ambulatory referral to Orthopedic Surgery  Hyperlipidemia, unspecified hyperlipidemia type - takin 3 x per week levels inc otpinso discussed  Edema, unspecified type  Left groin pain - Plan: Ambulatory referral to Orthopedic Surgery 6 months fu or as needed .    Risk benefit of medication discussed. '   Expectant management. Total visit 57mins > 50% spent counseling and coordinating care as indicated in above note and in instructions to patient .   -Patient advised to return or notify health care team  if  new concerns arise.  Patient Instructions  We will do  A referral to [facility]   About the groin hip leg  pain that may be a hip problem   I dont hear a murmur today and your bp  is good . Will follow exam .  Consider  cardiology  If having  Any concerns   ROV  wellness visit in about 6 months or after .  Can make  wellness visit with Wynetta Fines our health coach and  physical and med labs appointments  with me.  Blood work  is stable good except cholesterol . Up some probably not taking  every day      Standley Brooking. Lonnell Chaput M.D.

## 2016-04-09 NOTE — Progress Notes (Signed)
Pre visit review using our clinic review tool, if applicable. No additional management support is needed unless otherwise documented below in the visit note. 

## 2016-04-12 ENCOUNTER — Ambulatory Visit
Admission: RE | Admit: 2016-04-12 | Discharge: 2016-04-12 | Disposition: A | Discharge status: Home / Self Care | Payer: Medicare Other | Source: Ambulatory Visit | Attending: General Surgery | Admitting: General Surgery

## 2016-04-12 DIAGNOSIS — R928 Other abnormal and inconclusive findings on diagnostic imaging of breast: Secondary | ICD-10-CM | POA: Diagnosis not present

## 2016-04-12 DIAGNOSIS — Z853 Personal history of malignant neoplasm of breast: Secondary | ICD-10-CM

## 2016-04-17 DIAGNOSIS — J3081 Allergic rhinitis due to animal (cat) (dog) hair and dander: Secondary | ICD-10-CM | POA: Diagnosis not present

## 2016-04-17 DIAGNOSIS — J301 Allergic rhinitis due to pollen: Secondary | ICD-10-CM | POA: Diagnosis not present

## 2016-04-17 DIAGNOSIS — J3089 Other allergic rhinitis: Secondary | ICD-10-CM | POA: Diagnosis not present

## 2016-04-23 DIAGNOSIS — J3081 Allergic rhinitis due to animal (cat) (dog) hair and dander: Secondary | ICD-10-CM | POA: Diagnosis not present

## 2016-04-23 DIAGNOSIS — J301 Allergic rhinitis due to pollen: Secondary | ICD-10-CM | POA: Diagnosis not present

## 2016-04-23 DIAGNOSIS — J3089 Other allergic rhinitis: Secondary | ICD-10-CM | POA: Diagnosis not present

## 2016-04-24 DIAGNOSIS — M25552 Pain in left hip: Secondary | ICD-10-CM | POA: Diagnosis not present

## 2016-04-27 ENCOUNTER — Telehealth: Payer: Self-pay | Admitting: Internal Medicine

## 2016-04-27 DIAGNOSIS — J3089 Other allergic rhinitis: Secondary | ICD-10-CM | POA: Diagnosis not present

## 2016-04-27 DIAGNOSIS — J3081 Allergic rhinitis due to animal (cat) (dog) hair and dander: Secondary | ICD-10-CM | POA: Diagnosis not present

## 2016-04-27 DIAGNOSIS — R1310 Dysphagia, unspecified: Secondary | ICD-10-CM

## 2016-04-27 NOTE — Telephone Encounter (Signed)
Pt want a referral to a ent for her throat

## 2016-04-30 NOTE — Telephone Encounter (Signed)
Yes please do GI referral.

## 2016-04-30 NOTE — Telephone Encounter (Signed)
Spoke to the pt.  She feels like something is caught in her throat and feels like it is moving downward.  Also feels like there is mucus in her throat.  Asking for referral.  Please advise.  Thanks!!

## 2016-04-30 NOTE — Telephone Encounter (Signed)
Need more information     If not acute  No breathing acute choking    Can do GI referral    Or ent depending on where caugght feeling is  If high in throat and acute then ent

## 2016-04-30 NOTE — Telephone Encounter (Signed)
Spoke to the pt.  She states she is not choking or having difficulty breathing.  Feeling is at the bottom part of her throat.  Where would you like me to send her? GI?

## 2016-05-02 DIAGNOSIS — J3089 Other allergic rhinitis: Secondary | ICD-10-CM | POA: Diagnosis not present

## 2016-05-02 DIAGNOSIS — J3081 Allergic rhinitis due to animal (cat) (dog) hair and dander: Secondary | ICD-10-CM | POA: Diagnosis not present

## 2016-05-02 DIAGNOSIS — J301 Allergic rhinitis due to pollen: Secondary | ICD-10-CM | POA: Diagnosis not present

## 2016-05-02 NOTE — Telephone Encounter (Signed)
Referral for gi placed in the system.

## 2016-05-09 DIAGNOSIS — J3081 Allergic rhinitis due to animal (cat) (dog) hair and dander: Secondary | ICD-10-CM | POA: Diagnosis not present

## 2016-05-09 DIAGNOSIS — J301 Allergic rhinitis due to pollen: Secondary | ICD-10-CM | POA: Diagnosis not present

## 2016-05-09 DIAGNOSIS — J3089 Other allergic rhinitis: Secondary | ICD-10-CM | POA: Diagnosis not present

## 2016-05-15 DIAGNOSIS — J3081 Allergic rhinitis due to animal (cat) (dog) hair and dander: Secondary | ICD-10-CM | POA: Diagnosis not present

## 2016-05-15 DIAGNOSIS — J3089 Other allergic rhinitis: Secondary | ICD-10-CM | POA: Diagnosis not present

## 2016-05-15 DIAGNOSIS — J301 Allergic rhinitis due to pollen: Secondary | ICD-10-CM | POA: Diagnosis not present

## 2016-05-16 ENCOUNTER — Encounter: Payer: Self-pay | Admitting: Gastroenterology

## 2016-05-16 ENCOUNTER — Ambulatory Visit (HOSPITAL_COMMUNITY)
Admission: RE | Admit: 2016-05-16 | Discharge: 2016-05-16 | Disposition: A | Discharge status: Home / Self Care | Payer: Medicare Other | Source: Ambulatory Visit | Attending: Gastroenterology | Admitting: Gastroenterology

## 2016-05-16 ENCOUNTER — Ambulatory Visit (INDEPENDENT_AMBULATORY_CARE_PROVIDER_SITE_OTHER): Payer: Medicare Other | Admitting: Gastroenterology

## 2016-05-16 VITALS — BP 120/68 | HR 72 | Ht 59.75 in | Wt 154.0 lb

## 2016-05-16 DIAGNOSIS — J387 Other diseases of larynx: Secondary | ICD-10-CM | POA: Diagnosis not present

## 2016-05-16 DIAGNOSIS — R0989 Other specified symptoms and signs involving the circulatory and respiratory systems: Secondary | ICD-10-CM | POA: Insufficient documentation

## 2016-05-16 DIAGNOSIS — R070 Pain in throat: Secondary | ICD-10-CM | POA: Insufficient documentation

## 2016-05-16 DIAGNOSIS — M546 Pain in thoracic spine: Secondary | ICD-10-CM | POA: Diagnosis not present

## 2016-05-16 NOTE — Progress Notes (Signed)
     [facility] Progress Note  Chief Complaint: Throat pain and foreign-body sensation  Subjective  History:  Ms. Chiquito was sent by her memory care provider, Dr Regis Bill, for throat pain of several months duration. She was last seen by Dr. Deatra Ina in June 2015 deficiency anemia. Colonoscopy in July 2015 found diverticulosis and a small nonbleeding AVM that was ablated with APC. She had a longer standing history of iron deficiency anemia with an EGD in 2010 and ablation of duodenal AVMs. On this occasion, she feels that back in November she swallowed and the may have had a small bone and ever since then feels like there is a discomfort and foreign body sensation in the left side of the throat. She denies sharp or radiating pain, but just feels as if "there is something there". Originally it caused increased mucous production but now that has stopped, which has been a concern for her. She is able to swallow solids and liquids without difficulty, has noticed no hoarseness or other change in her vocal quality him and her weight is been stable. She does not feel anything stuck in the chest with eating, denies nausea, vomiting, early satiety or weight loss. Her bowel habits of an regular without rectal bleeding. He has a distant smoking history, having quit about 1970.  ROS: Cardiovascular:  no chest pain Respiratory: no dyspnea  The patient's Past Medical, Family and Social History were reviewed and are on file in the EMR.  Objective:  Med list reviewed  Vital signs in last 24 hrs: Vitals:   05/16/16 0833  BP: 120/68  Pulse: 72    Physical Exam  Well-appearing elderly woman who is in no distress, normal vocal quality  HEENT: sclera anicteric, oral mucosa moist without lesions  Neck: supple, no thyromegaly, JVD or lymphadenopathy  Cardiac: RRR without murmurs, S1S2 heard, no peripheral edema  Pulm: clear to auscultation bilaterally, normal RR and effort noted  Abdomen: soft,  no tenderness, with active bowel sounds. No guarding or palpable hepatosplenomegaly.   @ASSESSMENTPLANBEGIN @ Assessment: Encounter Diagnoses  Name Primary?  . Throat pain Yes  . Foreign body sensation in throat     It seems unlikely there is still a foreign body such as bone in the piriform sinus. Still, this is an area not seen well on upper endoscopy. I explained this to her, which is why I'm sending her to ENT for him in office fiberoptic exam.  Plan: Soft tissue films of the neck ASAP referral to ENT for an office fiberoptic exam   Total time 15 minutes, over half spent in counseling and coordination of care.   Nelida Meuse III

## 2016-05-16 NOTE — Patient Instructions (Addendum)
If you are age 81 or older, your body mass index should be between 23-30. Your Body mass index is 30.33 kg/m. If this is out of the aforementioned range listed, please consider follow up with your Primary Care Provider.  If you are age 42 or younger, your body mass index should be between 19-25. Your Body mass index is 30.33 kg/m. If this is out of the aformentioned range listed, please consider follow up with your Primary Care Provider.   We have scheduled you to see Dr Janace Hoard at [facility] on 05-21-2016 at 230pm. Please arrive 15 min early.   Please go to [facility] for the soft tissue xray of the neck.  Thank you for choosing [facility]  Dr Wilfrid Lund III

## 2016-05-21 DIAGNOSIS — K219 Gastro-esophageal reflux disease without esophagitis: Secondary | ICD-10-CM | POA: Diagnosis not present

## 2016-05-21 DIAGNOSIS — B370 Candidal stomatitis: Secondary | ICD-10-CM | POA: Diagnosis not present

## 2016-05-22 DIAGNOSIS — J3081 Allergic rhinitis due to animal (cat) (dog) hair and dander: Secondary | ICD-10-CM | POA: Diagnosis not present

## 2016-05-22 DIAGNOSIS — J301 Allergic rhinitis due to pollen: Secondary | ICD-10-CM | POA: Diagnosis not present

## 2016-05-22 DIAGNOSIS — J3089 Other allergic rhinitis: Secondary | ICD-10-CM | POA: Diagnosis not present

## 2016-05-29 DIAGNOSIS — J301 Allergic rhinitis due to pollen: Secondary | ICD-10-CM | POA: Diagnosis not present

## 2016-05-29 DIAGNOSIS — J3081 Allergic rhinitis due to animal (cat) (dog) hair and dander: Secondary | ICD-10-CM | POA: Diagnosis not present

## 2016-05-29 DIAGNOSIS — J3089 Other allergic rhinitis: Secondary | ICD-10-CM | POA: Diagnosis not present

## 2016-06-05 DIAGNOSIS — J301 Allergic rhinitis due to pollen: Secondary | ICD-10-CM | POA: Diagnosis not present

## 2016-06-05 DIAGNOSIS — J3081 Allergic rhinitis due to animal (cat) (dog) hair and dander: Secondary | ICD-10-CM | POA: Diagnosis not present

## 2016-06-05 DIAGNOSIS — J3089 Other allergic rhinitis: Secondary | ICD-10-CM | POA: Diagnosis not present

## 2016-06-07 DIAGNOSIS — K219 Gastro-esophageal reflux disease without esophagitis: Secondary | ICD-10-CM | POA: Diagnosis not present

## 2016-06-12 DIAGNOSIS — J3081 Allergic rhinitis due to animal (cat) (dog) hair and dander: Secondary | ICD-10-CM | POA: Diagnosis not present

## 2016-06-12 DIAGNOSIS — J3089 Other allergic rhinitis: Secondary | ICD-10-CM | POA: Diagnosis not present

## 2016-06-12 DIAGNOSIS — J301 Allergic rhinitis due to pollen: Secondary | ICD-10-CM | POA: Diagnosis not present

## 2016-06-19 DIAGNOSIS — C50911 Malignant neoplasm of unspecified site of right female breast: Secondary | ICD-10-CM | POA: Diagnosis not present

## 2016-06-20 DIAGNOSIS — J3081 Allergic rhinitis due to animal (cat) (dog) hair and dander: Secondary | ICD-10-CM | POA: Diagnosis not present

## 2016-06-20 DIAGNOSIS — J301 Allergic rhinitis due to pollen: Secondary | ICD-10-CM | POA: Diagnosis not present

## 2016-06-20 DIAGNOSIS — J3089 Other allergic rhinitis: Secondary | ICD-10-CM | POA: Diagnosis not present

## 2016-06-27 DIAGNOSIS — J301 Allergic rhinitis due to pollen: Secondary | ICD-10-CM | POA: Diagnosis not present

## 2016-06-27 DIAGNOSIS — J3081 Allergic rhinitis due to animal (cat) (dog) hair and dander: Secondary | ICD-10-CM | POA: Diagnosis not present

## 2016-06-27 DIAGNOSIS — J3089 Other allergic rhinitis: Secondary | ICD-10-CM | POA: Diagnosis not present

## 2016-07-04 DIAGNOSIS — J3089 Other allergic rhinitis: Secondary | ICD-10-CM | POA: Diagnosis not present

## 2016-07-04 DIAGNOSIS — J301 Allergic rhinitis due to pollen: Secondary | ICD-10-CM | POA: Diagnosis not present

## 2016-07-04 DIAGNOSIS — J3081 Allergic rhinitis due to animal (cat) (dog) hair and dander: Secondary | ICD-10-CM | POA: Diagnosis not present

## 2016-07-09 DIAGNOSIS — J301 Allergic rhinitis due to pollen: Secondary | ICD-10-CM | POA: Diagnosis not present

## 2016-07-09 DIAGNOSIS — J3089 Other allergic rhinitis: Secondary | ICD-10-CM | POA: Diagnosis not present

## 2016-07-09 DIAGNOSIS — J3081 Allergic rhinitis due to animal (cat) (dog) hair and dander: Secondary | ICD-10-CM | POA: Diagnosis not present

## 2016-07-27 ENCOUNTER — Encounter: Payer: Self-pay | Admitting: Gastroenterology

## 2016-08-01 DIAGNOSIS — J3089 Other allergic rhinitis: Secondary | ICD-10-CM | POA: Diagnosis not present

## 2016-08-01 DIAGNOSIS — J3081 Allergic rhinitis due to animal (cat) (dog) hair and dander: Secondary | ICD-10-CM | POA: Diagnosis not present

## 2016-08-01 DIAGNOSIS — J301 Allergic rhinitis due to pollen: Secondary | ICD-10-CM | POA: Diagnosis not present

## 2016-08-02 DIAGNOSIS — J301 Allergic rhinitis due to pollen: Secondary | ICD-10-CM | POA: Diagnosis not present

## 2016-08-21 DIAGNOSIS — J301 Allergic rhinitis due to pollen: Secondary | ICD-10-CM | POA: Diagnosis not present

## 2016-08-21 DIAGNOSIS — J3089 Other allergic rhinitis: Secondary | ICD-10-CM | POA: Diagnosis not present

## 2016-08-27 DIAGNOSIS — J3089 Other allergic rhinitis: Secondary | ICD-10-CM | POA: Diagnosis not present

## 2016-08-27 DIAGNOSIS — J3081 Allergic rhinitis due to animal (cat) (dog) hair and dander: Secondary | ICD-10-CM | POA: Diagnosis not present

## 2016-08-27 DIAGNOSIS — J301 Allergic rhinitis due to pollen: Secondary | ICD-10-CM | POA: Diagnosis not present

## 2016-09-03 DIAGNOSIS — J3089 Other allergic rhinitis: Secondary | ICD-10-CM | POA: Diagnosis not present

## 2016-09-03 DIAGNOSIS — J301 Allergic rhinitis due to pollen: Secondary | ICD-10-CM | POA: Diagnosis not present

## 2016-09-03 DIAGNOSIS — J3081 Allergic rhinitis due to animal (cat) (dog) hair and dander: Secondary | ICD-10-CM | POA: Diagnosis not present

## 2016-09-06 DIAGNOSIS — J3089 Other allergic rhinitis: Secondary | ICD-10-CM | POA: Diagnosis not present

## 2016-09-06 DIAGNOSIS — J301 Allergic rhinitis due to pollen: Secondary | ICD-10-CM | POA: Diagnosis not present

## 2016-09-06 DIAGNOSIS — J3081 Allergic rhinitis due to animal (cat) (dog) hair and dander: Secondary | ICD-10-CM | POA: Diagnosis not present

## 2016-09-10 DIAGNOSIS — J3081 Allergic rhinitis due to animal (cat) (dog) hair and dander: Secondary | ICD-10-CM | POA: Diagnosis not present

## 2016-09-10 DIAGNOSIS — J301 Allergic rhinitis due to pollen: Secondary | ICD-10-CM | POA: Diagnosis not present

## 2016-09-10 DIAGNOSIS — J3089 Other allergic rhinitis: Secondary | ICD-10-CM | POA: Diagnosis not present

## 2016-09-13 ENCOUNTER — Ambulatory Visit (INDEPENDENT_AMBULATORY_CARE_PROVIDER_SITE_OTHER): Payer: Medicare Other | Admitting: Family Medicine

## 2016-09-13 ENCOUNTER — Encounter: Payer: Self-pay | Admitting: Family Medicine

## 2016-09-13 VITALS — BP 128/80 | HR 74 | Temp 98.5°F | Wt 147.6 lb

## 2016-09-13 DIAGNOSIS — J301 Allergic rhinitis due to pollen: Secondary | ICD-10-CM | POA: Diagnosis not present

## 2016-09-13 DIAGNOSIS — I10 Essential (primary) hypertension: Secondary | ICD-10-CM | POA: Diagnosis not present

## 2016-09-13 DIAGNOSIS — J3089 Other allergic rhinitis: Secondary | ICD-10-CM | POA: Diagnosis not present

## 2016-09-13 DIAGNOSIS — J3081 Allergic rhinitis due to animal (cat) (dog) hair and dander: Secondary | ICD-10-CM | POA: Diagnosis not present

## 2016-09-13 NOTE — Patient Instructions (Signed)
It was a pleasure to see you today. Please use flonase and allegra as previously recommended. Also, please drink plenty of water, enough to keep your urine pale yellow or clear so you will remain hydrated during hot weather.   If symptoms do not improve, worsen, or new symptoms develops such as fever >100, follow up for further evaluation.  Allergic Rhinitis Allergic rhinitis is when the mucous membranes in the nose respond to allergens. Allergens are particles in the air that cause your body to have an allergic reaction. This causes you to release allergic antibodies. Through a chain of events, these eventually cause you to release histamine into the blood stream. Although meant to protect the body, it is this release of histamine that causes your discomfort, such as frequent sneezing, congestion, and an itchy, runny nose. What are the causes? Seasonal allergic rhinitis (hay fever) is caused by pollen allergens that may come from grasses, trees, and weeds. Year-round allergic rhinitis (perennial allergic rhinitis) is caused by allergens such as house dust mites, pet dander, and mold spores. What are the signs or symptoms?  Nasal stuffiness (congestion).  Itchy, runny nose with sneezing and tearing of the eyes. How is this diagnosed? Your health care provider can help you determine the allergen or allergens that trigger your symptoms. If you and your health care provider are unable to determine the allergen, skin or blood testing may be used. Your health care provider will diagnose your condition after taking your health history and performing a physical exam. Your health care provider may assess you for other related conditions, such as asthma, pink eye, or an ear infection. How is this treated? Allergic rhinitis does not have a cure, but it can be controlled by:  Medicines that block allergy symptoms. These may include allergy shots, nasal sprays, and oral antihistamines.  Avoiding the  allergen.  Hay fever may often be treated with antihistamines in pill or nasal spray forms. Antihistamines block the effects of histamine. There are over-the-counter medicines that may help with nasal congestion and swelling around the eyes. Check with your health care provider before taking or giving this medicine. If avoiding the allergen or the medicine prescribed do not work, there are many new medicines your health care provider can prescribe. Stronger medicine may be used if initial measures are ineffective. Desensitizing injections can be used if medicine and avoidance does not work. Desensitization is when a patient is given ongoing shots until the body becomes less sensitive to the allergen. Make sure you follow up with your health care provider if problems continue. Follow these instructions at home: It is not possible to completely avoid allergens, but you can reduce your symptoms by taking steps to limit your exposure to them. It helps to know exactly what you are allergic to so that you can avoid your specific triggers. Contact a health care provider if:  You have a fever.  You develop a cough that does not stop easily (persistent).  You have shortness of breath.  You start wheezing.  Symptoms interfere with normal daily activities. This information is not intended to replace advice given to you by your health care provider. Make sure you discuss any questions you have with your health care provider. Document Released: 11/14/2000 Document Revised: 10/21/2015 Document Reviewed: 10/27/2012 Elsevier Interactive Patient Education  2017 [employer].

## 2016-09-13 NOTE — Progress Notes (Signed)
Subjective:    Patient ID: [name], female    DOB: 1931-01-27, 83 y.o.   MRN: [id number]  HPI  [name] is an 83 year old female who presents today with itchy, watery eyes that has been present for one day. Associated rhinitis.  She denies fever, chills, sweats, N/V/D, sinus pressure/pain, ear pain, nasal congestion, and cough. She states that earlier she made an appointment to be evaluated today as she "did not feel like myself" however states after taking allergy medication she feels much better. She denies dizziness that was noted when appointment made as she stated she did not know what to state was her problem when making the appointment.  Denies presyncope/sncope, ear pressure, coughing, sneezing, change in vision, numbness, weakness, abnormal coordination/gait or hearing loss. Today, she reports that her apartment became warmer than usual which was related to problems with her [facility] that are now resolved. She reports feeling well now that [facility] is working and she has taken her allergy medication of allegra.  She  has a past medical history of Allergy; Anemia (2011); Arthritis; Asthma; Cancer ([place]); GERD (gastroesophageal reflux disease); Hyperlipidemia; and Hypertension.  Review of Systems  Constitutional: Negative for chills, fatigue and fever.  HENT: Positive for rhinorrhea. Negative for congestion, ear discharge, ear pain, postnasal drip, sinus pain, sinus pressure, sneezing and sore throat.   Eyes:       Itchy/watery eyes  Respiratory: Negative for cough and wheezing.   Cardiovascular: Negative for chest pain and palpitations.  Gastrointestinal: Negative for abdominal pain, diarrhea, nausea and vomiting.  Musculoskeletal: Negative for myalgias.  Neurological: Negative for dizziness, speech difficulty, weakness, light-headedness, numbness and headaches.  Psychiatric/Behavioral:       Denies depressed or anxious mood   Past Medical History:  Diagnosis Date  . Allergy   .  Anemia 2011   Since last visit she has had a Gi evaluation for newer onset Iron deficiiency anemia that was felt secondary to avm in duodenum rx with laser ablation.  . Arthritis    in back  . Asthma   . Cancer ([place])    rt breast  . GERD (gastroesophageal reflux disease)   . Hyperlipidemia   . Hypertension      Social History   Social History  . Marital status: Widowed    Spouse name: N/A  . Number of children: N/A  . Years of education: N/A   Occupational History  . Not on file.   Social History Main Topics  . Smoking status: Former Smoker    Packs/day: 0.25    Years: 4.00    Types: Cigarettes    Quit date: 03/05/1968  . Smokeless tobacco: Never Used  . Alcohol use No  . Drug use: No  . Sexual activity: Not on file   Other Topics Concern  . Not on file   Social History Narrative   Widowed 2001    Ex smoker   HHof 1    Active in church and volunteer activities    [place] a few times per week   Swimming 2 x per week.    Tobacco 49=58 less than 1ppd.              Past Surgical History:  Procedure Laterality Date  . BREAST LUMPECTOMY  04/26/2010   right  . COLONOSCOPY WITH PROPOFOL N/A 09/14/2013   Procedure: COLONOSCOPY WITH PROPOFOL;  Surgeon: Inda Castle, MD;  Location: [facility];  Service: Endoscopy;  Laterality: N/A;  .  HOT HEMOSTASIS N/A 09/14/2013   Procedure: HOT HEMOSTASIS (ARGON PLASMA COAGULATION/BICAP);  Surgeon: Inda Castle, MD;  Location: [facility];  Service: Endoscopy;  Laterality: N/A;  . LARYNGECTOMY  20-25 yrs ago  . PARTIAL HIP ARTHROPLASTY     rt    Family History  Problem Relation Age of Onset  . Cancer Mother        breast and ovarian    No Known Allergies  Current Outpatient Prescriptions on File Prior to Visit  Medication Sig Dispense Refill  . amLODipine (NORVASC) 10 MG tablet TAKE 1 TABLET (10 MG TOTAL) DAILY 90 tablet 1  . budesonide-formoterol (SYMBICORT) 160-4.5 MCG/ACT inhaler Inhale 2 puffs into the lungs 2  (two) times daily as needed (Shortness of breath).     . cycloSPORINE (RESTASIS) 0.05 % ophthalmic emulsion Place 1 drop into both eyes 2 (two) times daily.    Marland Kitchen EPIPEN 2-PAK 0.3 MG/0.3ML SOAJ injection INJ UTD PRN  1  . ferrous sulfate 325 (65 FE) MG tablet Take 325 mg by mouth daily with breakfast.    . KLOR-CON M20 20 MEQ tablet TAKE 1 TABLET TWICE A DAY 180 tablet 0  . lisinopril-hydrochlorothiazide (PRINZIDE,ZESTORETIC) 20-12.5 MG tablet Take 1 tablet by mouth 2 (two) times daily. 180 tablet 1  . Magnesium 500 MG CAPS Take 500 mg by mouth daily.    . montelukast (SINGULAIR) 10 MG tablet Take 10 mg by mouth at bedtime.    Marland Kitchen omeprazole (PRILOSEC) 20 MG capsule Take 1 capsule (20 mg total) by mouth daily as needed (Acid Reflux). 90 capsule 2  . simvastatin (ZOCOR) 20 MG tablet Take 1 tablet (20 mg total) by mouth at bedtime. 90 tablet 3  . solifenacin (VESICARE) 5 MG tablet Take 1 tablet (5 mg total) by mouth daily. 90 tablet 3   No current facility-administered medications on file prior to visit.     BP 128/80 (BP Location: Left Arm, Patient Position: Sitting, Cuff Size: Normal)   Pulse 74   Temp 98.5 F (36.9 C) (Oral)   Wt 147 lb 9.6 oz (67 kg)   SpO2 97%   BMI 29.07 kg/m       Objective:   Physical Exam  Constitutional: She is oriented to person, place, and time. She appears well-developed and well-nourished.  HENT:  Nose: Rhinorrhea present. Left sinus exhibits no maxillary sinus tenderness and no frontal sinus tenderness.  Mouth/Throat: Oropharynx is clear and moist and mucous membranes are normal.  Eyes: Pupils are equal, round, and reactive to light. No scleral icterus.  Itchy/watery eyes  Neck: Neck supple.  Cardiovascular: Normal rate and regular rhythm.   Pulmonary/Chest: Effort normal and breath sounds normal. She has no wheezes. She has no rales.  Abdominal: Soft. Bowel sounds are normal. There is no tenderness.  Lymphadenopathy:    She has no cervical  adenopathy.  Neurological: She is alert and oriented to person, place, and time.  Skin: Skin is warm and dry. No rash noted.  Psychiatric: She has a normal mood and affect. Her behavior is normal. Judgment and thought content normal.      Assessment & Plan:  1. Allergic rhinitis due to other allergic trigger, unspecified seasonality Symptoms consistent with allergic rhinitis; advised use of flonase and allegra for symptoms. Advised follow up with PCP if symptoms do not improve with treatment, worsen, or new symptoms develop such as fever >100, or rash.  2. Essential hypertension Controlled; adherent to medications.   Delano Metz, FNP-C

## 2016-09-18 DIAGNOSIS — J3089 Other allergic rhinitis: Secondary | ICD-10-CM | POA: Diagnosis not present

## 2016-09-18 DIAGNOSIS — J301 Allergic rhinitis due to pollen: Secondary | ICD-10-CM | POA: Diagnosis not present

## 2016-09-18 DIAGNOSIS — J3081 Allergic rhinitis due to animal (cat) (dog) hair and dander: Secondary | ICD-10-CM | POA: Diagnosis not present

## 2016-09-27 DIAGNOSIS — J3089 Other allergic rhinitis: Secondary | ICD-10-CM | POA: Diagnosis not present

## 2016-09-27 DIAGNOSIS — J301 Allergic rhinitis due to pollen: Secondary | ICD-10-CM | POA: Diagnosis not present

## 2016-09-27 DIAGNOSIS — J3081 Allergic rhinitis due to animal (cat) (dog) hair and dander: Secondary | ICD-10-CM | POA: Diagnosis not present

## 2016-10-01 DIAGNOSIS — J301 Allergic rhinitis due to pollen: Secondary | ICD-10-CM | POA: Diagnosis not present

## 2016-10-01 DIAGNOSIS — J3089 Other allergic rhinitis: Secondary | ICD-10-CM | POA: Diagnosis not present

## 2016-10-01 DIAGNOSIS — J4530 Mild persistent asthma, uncomplicated: Secondary | ICD-10-CM | POA: Diagnosis not present

## 2016-10-01 DIAGNOSIS — J3081 Allergic rhinitis due to animal (cat) (dog) hair and dander: Secondary | ICD-10-CM | POA: Diagnosis not present

## 2016-10-03 DIAGNOSIS — Z1509 Genetic susceptibility to other malignant neoplasm: Secondary | ICD-10-CM | POA: Diagnosis not present

## 2016-10-03 DIAGNOSIS — Z8041 Family history of malignant neoplasm of ovary: Secondary | ICD-10-CM | POA: Diagnosis not present

## 2016-10-03 DIAGNOSIS — Z853 Personal history of malignant neoplasm of breast: Secondary | ICD-10-CM | POA: Diagnosis not present

## 2016-10-03 DIAGNOSIS — Z803 Family history of malignant neoplasm of breast: Secondary | ICD-10-CM | POA: Diagnosis not present

## 2016-10-04 NOTE — Progress Notes (Signed)
Chief Complaint  Patient presents with  . Follow-up    HPI: [name] 83 y.o. come in for concern about blood sugar  Went to [facility]  [facility] and told she was   Borderline.  In spring but doesn't have the numbers   Moving  This past year and since then   Eating less sugars  Not baakin cake.s cut out sweets   This was  In  Spring.  Last  Food yesterday  Asthma ok  Inhaler   Ok   ?  Still bowling. Doing  Better. RA.   Walker one day and then not .  Using to[ical otc menthol  Got new bed and swelling in feet better in am   Ambulatory  But ocass uses walker  Cane  ROS: See pertinent positives and negatives per HPI. No cp sob  Some weight loss not trying but not that hungry and not cooking   Says taking meds but not the bladder med  Except when travels  Past Medical History:  Diagnosis Date  . Allergy   . Anemia 2011   Since last visit she has had a Gi evaluation for newer onset Iron deficiiency anemia that was felt secondary to avm in duodenum rx with laser ablation.  . Arthritis    in back  . Asthma   . Cancer ([place])    rt breast  . GERD (gastroesophageal reflux disease)   . Hyperlipidemia   . Hypertension     Family History  Problem Relation Age of Onset  . Cancer Mother        breast and ovarian    Social History   Social History  . Marital status: Widowed    Spouse name: N/A  . Number of children: N/A  . Years of education: N/A   Social History Main Topics  . Smoking status: Former Smoker    Packs/day: 0.25    Years: 4.00    Types: Cigarettes    Quit date: 03/05/1968  . Smokeless tobacco: Never Used  . Alcohol use No  . Drug use: No  . Sexual activity: Not Asked   Other Topics Concern  . None   Social History Narrative   Widowed 2001    Ex smoker   HHof 1    Active in church and volunteer activities    [place] a few times per week   Swimming 2 x per week.    Tobacco 49=56 less than 1ppd.              Outpatient Medications Prior to Visit    Medication Sig Dispense Refill  . amLODipine (NORVASC) 10 MG tablet TAKE 1 TABLET (10 MG TOTAL) DAILY 90 tablet 1  . budesonide-formoterol (SYMBICORT) 160-4.5 MCG/ACT inhaler Inhale 2 puffs into the lungs 2 (two) times daily as needed (Shortness of breath).     . cycloSPORINE (RESTASIS) 0.05 % ophthalmic emulsion Place 1 drop into both eyes 2 (two) times daily.    . ferrous sulfate 325 (65 FE) MG tablet Take 325 mg by mouth daily with breakfast.    . KLOR-CON M20 20 MEQ tablet TAKE 1 TABLET TWICE A DAY 180 tablet 0  . lisinopril-hydrochlorothiazide (PRINZIDE,ZESTORETIC) 20-12.5 MG tablet Take 1 tablet by mouth 2 (two) times daily. 180 tablet 1  . Magnesium 500 MG CAPS Take 500 mg by mouth daily.    . montelukast (SINGULAIR) 10 MG tablet Take 10 mg by mouth at bedtime.    Marland Kitchen omeprazole (PRILOSEC)  20 MG capsule Take 1 capsule (20 mg total) by mouth daily as needed (Acid Reflux). 90 capsule 2  . simvastatin (ZOCOR) 20 MG tablet Take 1 tablet (20 mg total) by mouth at bedtime. 90 tablet 3  . EPIPEN 2-PAK 0.3 MG/0.3ML SOAJ injection INJ UTD PRN  1  . solifenacin (VESICARE) 5 MG tablet Take 1 tablet (5 mg total) by mouth daily. (Patient not taking: Reported on 10/05/2016) 90 tablet 3   No facility-administered medications prior to visit.      EXAM:  BP 122/80 (BP Location: Left Arm, Patient Position: Sitting, Cuff Size: Normal)   Pulse 74   Temp 98.1 F (36.7 C) (Oral)   Wt 145 lb 12.8 oz (66.1 kg)   BMI 28.71 kg/m   Body mass index is 28.71 kg/m.  GENERAL: vitals reviewed and listed above, alert, , appears well hydrated and in no acute distress HEENT: atraumatic, conjunctiva  clear, no obvious abnormalities on inspection of external nose and ears e  NECK: no obvious masses on inspection palpation  CV: HRRR, no clubbing cyanosis or   2+ peripheral edema nl cap refill  MS: moves all extremities without noticeable focal  abnormality PSYCH: pleasant and cooperative, no obvious depression or  anxiety nl speech   Gait some  Pain getting up from chair but gait  Unassisted  Slow bent over   BP Readings from Last 3 Encounters:  10/05/16 122/80  09/13/16 128/80  05/16/16 120/68   Lab Results  Component Value Date   WBC 9.0 09/02/2014   HGB 13.0 09/02/2014   HCT 40.0 09/02/2014   PLT 311.0 09/02/2014   GLUCOSE 85 04/05/2016   CHOL 216 (H) 04/05/2016   TRIG 105.0 04/05/2016   HDL 61.30 04/05/2016   LDLCALC 134 (H) 04/05/2016   ALT 12 04/05/2016   AST 22 04/05/2016   NA 143 04/05/2016   K 3.6 04/05/2016   CL 103 04/05/2016   CREATININE 0.64 04/05/2016   BUN 11 04/05/2016   CO2 32 04/05/2016   TSH 0.73 04/05/2016     ASSESSMENT AND PLAN:  Discussed the following assessment and plan:  Elevated blood sugar level - reported at [facility]   has changed diet  less appetite disc strategies check lab today - Plan: Basic metabolic panel, CBC with Differential/Platelet, Hemoglobin A1c  Well-controlled hypertension - Plan: Basic metabolic panel, CBC with Differential/Platelet, Hemoglobin A1c  Medication management   Dec appetite but has lost smell doesn't feel like cooking much moved last year for safely reasons   Back and joint off and on .  Has seen rheum    No falling now  Still likes to bowl when she can  Didn't remember when had cards eval  thjought more recent    Reviewed with patiet nl echo and myoview in 12 13  ? If she is having memory  Issues  Will follow   Up at next visit  If  Ongoing issues then   Recheck earlier   Denies abd pain bowel change  . Total visit 34mins > 50% spent counseling and coordinating care as indicated in above note and in instructions to patient .     -Patient advised to return or notify health care team  if  new concerns arise.  Patient Instructions    Wt Readings from Last 3 Encounters:  10/05/16 145 lb 12.8 oz (66.1 kg)  09/13/16 147 lb 9.6 oz (67 kg)  05/16/16 154 lb (69.9 kg)  attention healthy food s  Protein  In breakfast .     Last heart   tests  12 2013 and was normal .   Let us know if you have continued concerns other wise  Check up in February .       Standley Brooking. Antaniya Venuti M.D.

## 2016-10-05 ENCOUNTER — Ambulatory Visit (INDEPENDENT_AMBULATORY_CARE_PROVIDER_SITE_OTHER): Payer: Medicare Other | Admitting: Internal Medicine

## 2016-10-05 ENCOUNTER — Encounter: Payer: Self-pay | Admitting: Internal Medicine

## 2016-10-05 VITALS — BP 122/80 | HR 74 | Temp 98.1°F | Wt 145.8 lb

## 2016-10-05 DIAGNOSIS — R739 Hyperglycemia, unspecified: Secondary | ICD-10-CM

## 2016-10-05 DIAGNOSIS — I10 Essential (primary) hypertension: Secondary | ICD-10-CM | POA: Diagnosis not present

## 2016-10-05 DIAGNOSIS — Z79899 Other long term (current) drug therapy: Secondary | ICD-10-CM

## 2016-10-05 LAB — CBC WITH DIFFERENTIAL/PLATELET
Basophils Absolute: 0 10*3/uL (ref 0.0–0.1)
Basophils Relative: 0.6 % (ref 0.0–3.0)
Eosinophils Absolute: 0.2 10*3/uL (ref 0.0–0.7)
Eosinophils Relative: 3.5 % (ref 0.0–5.0)
HCT: 39 % (ref 36.0–46.0)
Hemoglobin: 12.3 g/dL (ref 12.0–15.0)
Lymphocytes Relative: 15.8 % (ref 12.0–46.0)
Lymphs Abs: 1.1 10*3/uL (ref 0.7–4.0)
MCHC: 31.6 g/dL (ref 30.0–36.0)
MCV: 82.2 fl (ref 78.0–100.0)
Monocytes Absolute: 0.5 10*3/uL (ref 0.1–1.0)
Monocytes Relative: 7.1 % (ref 3.0–12.0)
Neutro Abs: 5.1 10*3/uL (ref 1.4–7.7)
Neutrophils Relative %: 73 % (ref 43.0–77.0)
Platelets: 370 10*3/uL (ref 150.0–400.0)
RBC: 4.75 Mil/uL (ref 3.87–5.11)
RDW: 15.7 % — ABNORMAL HIGH (ref 11.5–15.5)
WBC: 7 10*3/uL (ref 4.0–10.5)

## 2016-10-05 LAB — BASIC METABOLIC PANEL
BUN: 9 mg/dL (ref 6–23)
CO2: 31 mEq/L (ref 19–32)
Calcium: 9.6 mg/dL (ref 8.4–10.5)
Chloride: 102 mEq/L (ref 96–112)
Creatinine, Ser: 0.71 mg/dL (ref 0.40–1.20)
GFR: 100.78 mL/min (ref >60.00)
Glucose, Bld: 78 mg/dL (ref 70–99)
Potassium: 4.3 mEq/L (ref 3.5–5.1)
Sodium: 140 mEq/L (ref 135–145)

## 2016-10-05 LAB — HEMOGLOBIN A1C: Hgb A1c MFr Bld: 5.8 % (ref 4.6–6.5)

## 2016-10-05 NOTE — Patient Instructions (Addendum)
  Wt Readings from Last 3 Encounters:  10/05/16 145 lb 12.8 oz (66.1 kg)  09/13/16 147 lb 9.6 oz (67 kg)  05/16/16 154 lb (69.9 kg)  attention healthy food s  Protein  In breakfast .   Last heart   tests  12 2013 and was normal .   Let us know if you have continued concerns other wise  Check up in February .

## 2016-10-08 ENCOUNTER — Ambulatory Visit: Payer: Medicare Other

## 2016-10-08 DIAGNOSIS — J3089 Other allergic rhinitis: Secondary | ICD-10-CM | POA: Diagnosis not present

## 2016-10-08 DIAGNOSIS — J301 Allergic rhinitis due to pollen: Secondary | ICD-10-CM | POA: Diagnosis not present

## 2016-10-08 DIAGNOSIS — J3081 Allergic rhinitis due to animal (cat) (dog) hair and dander: Secondary | ICD-10-CM | POA: Diagnosis not present

## 2016-10-17 ENCOUNTER — Telehealth: Payer: Self-pay | Admitting: Internal Medicine

## 2016-10-17 DIAGNOSIS — J3089 Other allergic rhinitis: Secondary | ICD-10-CM | POA: Diagnosis not present

## 2016-10-17 DIAGNOSIS — J301 Allergic rhinitis due to pollen: Secondary | ICD-10-CM | POA: Diagnosis not present

## 2016-10-17 DIAGNOSIS — J3081 Allergic rhinitis due to animal (cat) (dog) hair and dander: Secondary | ICD-10-CM | POA: Diagnosis not present

## 2016-10-17 NOTE — Telephone Encounter (Addendum)
Pt would like a referral to rheumatologist for arthritis in back. Pt is aware md out of office until monday

## 2016-10-18 NOTE — Telephone Encounter (Signed)
Please advise 

## 2016-10-19 ENCOUNTER — Other Ambulatory Visit: Payer: Self-pay | Admitting: Emergency Medicine

## 2016-10-19 DIAGNOSIS — M2550 Pain in unspecified joint: Secondary | ICD-10-CM

## 2016-10-19 DIAGNOSIS — M549 Dorsalgia, unspecified: Secondary | ICD-10-CM

## 2016-10-19 NOTE — Telephone Encounter (Signed)
Referral has been placed. 

## 2016-10-19 NOTE — Telephone Encounter (Signed)
Ok to refer  For  Back and joint pain   (Patient was seen by [facility] rheum in 2016 But that practice has changed  Providers ?since then )

## 2016-10-23 DIAGNOSIS — J3089 Other allergic rhinitis: Secondary | ICD-10-CM | POA: Diagnosis not present

## 2016-10-23 DIAGNOSIS — J3081 Allergic rhinitis due to animal (cat) (dog) hair and dander: Secondary | ICD-10-CM | POA: Diagnosis not present

## 2016-10-23 DIAGNOSIS — J301 Allergic rhinitis due to pollen: Secondary | ICD-10-CM | POA: Diagnosis not present

## 2016-10-25 DIAGNOSIS — H2513 Age-related nuclear cataract, bilateral: Secondary | ICD-10-CM | POA: Diagnosis not present

## 2016-10-29 ENCOUNTER — Telehealth: Payer: Self-pay | Admitting: Internal Medicine

## 2016-10-29 DIAGNOSIS — J3089 Other allergic rhinitis: Secondary | ICD-10-CM | POA: Diagnosis not present

## 2016-10-29 DIAGNOSIS — J301 Allergic rhinitis due to pollen: Secondary | ICD-10-CM | POA: Diagnosis not present

## 2016-10-29 DIAGNOSIS — J3081 Allergic rhinitis due to animal (cat) (dog) hair and dander: Secondary | ICD-10-CM | POA: Diagnosis not present

## 2016-10-29 NOTE — Telephone Encounter (Signed)
° ° ° °  Pt said the pain is now in her left  legs and is working with a cane. She said she is not sure what the problem is and would like a referral to see someone about the pain.   She said pain go from her hip to her leg

## 2016-10-29 NOTE — Telephone Encounter (Signed)
Dr. Regis Bill   Please advise-  I spoke with pt and she was requesting a referral for pain in her leg that has started from her left hip that extends down her knee and now she at times needs to walk with a cane. I saw from phone note on 8/15 Tor[facility] placed a referral to rheumatology. According to the referral note Dr. Marjory Lies declined. Did you want to place a referral to ortho?

## 2016-10-29 NOTE — Telephone Encounter (Signed)
Yes please  Refer to ortho  thanks

## 2016-10-30 ENCOUNTER — Other Ambulatory Visit: Payer: Self-pay | Admitting: Emergency Medicine

## 2016-10-30 DIAGNOSIS — M2550 Pain in unspecified joint: Secondary | ICD-10-CM

## 2016-10-30 DIAGNOSIS — M549 Dorsalgia, unspecified: Secondary | ICD-10-CM

## 2016-10-30 NOTE — Telephone Encounter (Signed)
Referral has been placed. 

## 2016-10-31 DIAGNOSIS — J3081 Allergic rhinitis due to animal (cat) (dog) hair and dander: Secondary | ICD-10-CM | POA: Diagnosis not present

## 2016-10-31 DIAGNOSIS — J3089 Other allergic rhinitis: Secondary | ICD-10-CM | POA: Diagnosis not present

## 2016-10-31 DIAGNOSIS — J301 Allergic rhinitis due to pollen: Secondary | ICD-10-CM | POA: Diagnosis not present

## 2016-11-07 DIAGNOSIS — J3089 Other allergic rhinitis: Secondary | ICD-10-CM | POA: Diagnosis not present

## 2016-11-07 DIAGNOSIS — J301 Allergic rhinitis due to pollen: Secondary | ICD-10-CM | POA: Diagnosis not present

## 2016-11-07 DIAGNOSIS — J3081 Allergic rhinitis due to animal (cat) (dog) hair and dander: Secondary | ICD-10-CM | POA: Diagnosis not present

## 2016-11-12 DIAGNOSIS — M25559 Pain in unspecified hip: Secondary | ICD-10-CM | POA: Diagnosis not present

## 2016-11-12 DIAGNOSIS — M25519 Pain in unspecified shoulder: Secondary | ICD-10-CM | POA: Diagnosis not present

## 2016-11-12 DIAGNOSIS — M549 Dorsalgia, unspecified: Secondary | ICD-10-CM | POA: Diagnosis not present

## 2016-11-12 DIAGNOSIS — M1612 Unilateral primary osteoarthritis, left hip: Secondary | ICD-10-CM | POA: Diagnosis not present

## 2016-11-12 DIAGNOSIS — M1990 Unspecified osteoarthritis, unspecified site: Secondary | ICD-10-CM | POA: Diagnosis not present

## 2016-11-12 DIAGNOSIS — M2550 Pain in unspecified joint: Secondary | ICD-10-CM | POA: Diagnosis not present

## 2016-11-12 DIAGNOSIS — M47816 Spondylosis without myelopathy or radiculopathy, lumbar region: Secondary | ICD-10-CM | POA: Diagnosis not present

## 2016-11-14 DIAGNOSIS — J3081 Allergic rhinitis due to animal (cat) (dog) hair and dander: Secondary | ICD-10-CM | POA: Diagnosis not present

## 2016-11-14 DIAGNOSIS — J301 Allergic rhinitis due to pollen: Secondary | ICD-10-CM | POA: Diagnosis not present

## 2016-11-14 DIAGNOSIS — J3089 Other allergic rhinitis: Secondary | ICD-10-CM | POA: Diagnosis not present

## 2016-11-20 DIAGNOSIS — J301 Allergic rhinitis due to pollen: Secondary | ICD-10-CM | POA: Diagnosis not present

## 2016-11-20 DIAGNOSIS — J3081 Allergic rhinitis due to animal (cat) (dog) hair and dander: Secondary | ICD-10-CM | POA: Diagnosis not present

## 2016-11-20 DIAGNOSIS — J3089 Other allergic rhinitis: Secondary | ICD-10-CM | POA: Diagnosis not present

## 2016-11-26 DIAGNOSIS — J3089 Other allergic rhinitis: Secondary | ICD-10-CM | POA: Diagnosis not present

## 2016-11-26 DIAGNOSIS — M1612 Unilateral primary osteoarthritis, left hip: Secondary | ICD-10-CM | POA: Diagnosis not present

## 2016-11-26 DIAGNOSIS — J3081 Allergic rhinitis due to animal (cat) (dog) hair and dander: Secondary | ICD-10-CM | POA: Diagnosis not present

## 2016-11-26 DIAGNOSIS — J301 Allergic rhinitis due to pollen: Secondary | ICD-10-CM | POA: Diagnosis not present

## 2016-11-30 ENCOUNTER — Telehealth: Payer: Self-pay | Admitting: Internal Medicine

## 2016-11-30 ENCOUNTER — Other Ambulatory Visit: Payer: Self-pay | Admitting: Emergency Medicine

## 2016-11-30 MED ORDER — AMLODIPINE BESYLATE 10 MG PO TABS: ORAL_TABLET | ORAL | 1 refills | Status: DC

## 2016-11-30 NOTE — Telephone Encounter (Signed)
Pt needs new rx amlodipine 10 mg #90 w/refill at her new pharm [facility]. Pt is out

## 2016-11-30 NOTE — Telephone Encounter (Signed)
Patient rx has been sent to new pharmacy nothing further needed

## 2016-12-03 DIAGNOSIS — J3089 Other allergic rhinitis: Secondary | ICD-10-CM | POA: Diagnosis not present

## 2016-12-03 DIAGNOSIS — J3081 Allergic rhinitis due to animal (cat) (dog) hair and dander: Secondary | ICD-10-CM | POA: Diagnosis not present

## 2016-12-03 DIAGNOSIS — J301 Allergic rhinitis due to pollen: Secondary | ICD-10-CM | POA: Diagnosis not present

## 2016-12-11 DIAGNOSIS — J301 Allergic rhinitis due to pollen: Secondary | ICD-10-CM | POA: Diagnosis not present

## 2016-12-11 DIAGNOSIS — J3089 Other allergic rhinitis: Secondary | ICD-10-CM | POA: Diagnosis not present

## 2016-12-11 DIAGNOSIS — J3081 Allergic rhinitis due to animal (cat) (dog) hair and dander: Secondary | ICD-10-CM | POA: Diagnosis not present

## 2016-12-14 ENCOUNTER — Telehealth: Payer: Self-pay | Admitting: Internal Medicine

## 2016-12-14 NOTE — Telephone Encounter (Signed)
Called pt because she has an AWV scheduled for 10/18 - this appt needs to be rescheduled on a day that Dr Regis Bill is here all day.  She has to sign off on the visit and cannot do that if she isnt here.  lmom 10/12

## 2016-12-17 DIAGNOSIS — J3081 Allergic rhinitis due to animal (cat) (dog) hair and dander: Secondary | ICD-10-CM | POA: Diagnosis not present

## 2016-12-17 DIAGNOSIS — J301 Allergic rhinitis due to pollen: Secondary | ICD-10-CM | POA: Diagnosis not present

## 2016-12-17 DIAGNOSIS — J3089 Other allergic rhinitis: Secondary | ICD-10-CM | POA: Diagnosis not present

## 2016-12-18 DIAGNOSIS — Z23 Encounter for immunization: Secondary | ICD-10-CM | POA: Diagnosis not present

## 2016-12-19 NOTE — Progress Notes (Deleted)
Subjective:   [name] is a 83 y.o. female who presents for Medicare Annual (Subsequent) preventive examination.  The Patient was informed that the wellness visit is to identify future health risk and educate and initiate measures that can reduce risk for increased disease through the lifespan.    Annual Wellness Assessment  Reports health as   Preventive Screening -Counseling & Management  Medicare Annual Preventive Care Visit - Subsequent Last OV 10/2016  Colonoscopy 09/2013 Mammogram 02/208 Dexa 03/2006    Describes Health as poor, fair, good or great?   VS reviewed;   Diet   BMI  Exercise  Dental  Stressors:   Sleep patterns:   Pain?    Cardiac Risk Factors Addressed Hyperlipidemia - feb 2018; ratio 4; chol 216 hdl 61; trig 105; ldl 134  Diabetes Pre-diabetes 5.8 BS <99 A1c Obesity  Advanced Directives  Patient Care Team: Panosh, Standley Brooking, MD as PCP - General (Internal Medicine) Jovita Kussmaul, MD (General Surgery) Mosetta Anis, MD (Allergy) Judeth Horn, MD as Referring Physician (Rheumatology)               Objective:     Vitals: There were no vitals taken for this visit.  There is no height or weight on file to calculate BMI.   Tobacco History  Smoking Status  . Former Smoker  . Packs/day: 0.25  . Years: 4.00  . Types: Cigarettes  . Quit date: 03/05/1968  Smokeless Tobacco  . Never Used     Counseling given: Not Answered   Past Medical History:  Diagnosis Date  . Allergy   . Anemia 2011   Since last visit she has had a Gi evaluation for newer onset Iron deficiiency anemia that was felt secondary to avm in duodenum rx with laser ablation.  . Arthritis    in back  . Asthma   . [place]    rt breast  . GERD (gastroesophageal reflux disease)   . Hyperlipidemia   . Hypertension    Past Surgical History:  Procedure Laterality Date  . BREAST LUMPECTOMY  04/26/2010   right  . COLONOSCOPY WITH PROPOFOL  N/A 09/14/2013   Procedure: COLONOSCOPY WITH PROPOFOL;  Surgeon: Inda Castle, MD;  Location: [facility];  Service: Endoscopy;  Laterality: N/A;  . HOT HEMOSTASIS N/A 09/14/2013   Procedure: HOT HEMOSTASIS (ARGON PLASMA COAGULATION/BICAP);  Surgeon: Inda Castle, MD;  Location: [facility];  Service: Endoscopy;  Laterality: N/A;  . LARYNGECTOMY  20-25 yrs ago  . PARTIAL HIP ARTHROPLASTY     rt   Family History  Problem Relation Age of Onset  . Cancer Mother        breast and ovarian   History  Sexual Activity  . Sexual activity: Not on file    Outpatient Encounter Prescriptions as of 12/20/2016  Medication Sig  . amLODipine (NORVASC) 10 MG tablet TAKE 1 TABLET (10 MG TOTAL) DAILY  . budesonide-formoterol (SYMBICORT) 160-4.5 MCG/ACT inhaler Inhale 2 puffs into the lungs 2 (two) times daily as needed (Shortness of breath).   . cycloSPORINE (RESTASIS) 0.05 % ophthalmic emulsion Place 1 drop into both eyes 2 (two) times daily.  Marland Kitchen EPIPEN 2-PAK 0.3 MG/0.3ML SOAJ injection INJ UTD PRN  . ferrous sulfate 325 (65 FE) MG tablet Take 325 mg by mouth daily with breakfast.  . fexofenadine (ALLEGRA) 180 MG tablet Take 180 mg by mouth.  Marland Kitchen KLOR-CON M20 20 MEQ tablet TAKE 1 TABLET TWICE A DAY  .  lisinopril-hydrochlorothiazide (PRINZIDE,ZESTORETIC) 20-12.5 MG tablet Take 1 tablet by mouth 2 (two) times daily.  . Magnesium 500 MG CAPS Take 500 mg by mouth daily.  . montelukast (SINGULAIR) 10 MG tablet Take 10 mg by mouth at bedtime.  Marland Kitchen omeprazole (PRILOSEC) 20 MG capsule Take 1 capsule (20 mg total) by mouth daily as needed (Acid Reflux).  . simvastatin (ZOCOR) 20 MG tablet Take 1 tablet (20 mg total) by mouth at bedtime.  . solifenacin (VESICARE) 5 MG tablet Take 1 tablet (5 mg total) by mouth daily. (Patient not taking: Reported on 10/05/2016)   No facility-administered encounter medications on file as of 12/20/2016.     Activities of Daily Living No flowsheet data found.  Patient Care  Team: Panosh, Standley Brooking, MD as PCP - General (Internal Medicine) Jovita Kussmaul, MD (General Surgery) Mosetta Anis, MD (Allergy) Judeth Horn, MD as Referring Physician (Rheumatology)    Assessment:    *** Exercise Activities and Dietary recommendations    Goals    None     Fall Risk Fall Risk  09/20/2015 06/12/2013  Falls in the past year? No No   Depression Screen PHQ 2/9 Scores 09/20/2015 06/12/2013  PHQ - 2 Score 0 0     Cognitive Function        Immunization History  Administered Date(s) Administered  . Influenza Split 11/16/2011  . Influenza Whole 12/06/2006, 11/25/2007, 02/03/2009, 11/21/2009  . Influenza, High Dose Seasonal PF 11/23/2014, 12/22/2015  . Influenza,inj,Quad PF,6+ Mos 11/28/2012  . Influenza-Unspecified 12/03/2013  . Pneumococcal Conjugate-13 06/12/2013  . Pneumococcal Polysaccharide-23 03/06/2003  . Td 07/21/2001   Screening Tests Health Maintenance  Topic Date Due  . TETANUS/TDAP  07/22/2011  . INFLUENZA VACCINE  10/03/2016  . DEXA SCAN  Completed  . PNA vac Low Risk Adult  Completed      Plan:   ***   I have personally reviewed and noted the following in the patient's chart:   . Medical and social history . Use of alcohol, tobacco or illicit drugs  . Current medications and supplements . Functional ability and status . Nutritional status . Physical activity . Advanced directives . List of other physicians . Hospitalizations, surgeries, and ER visits in previous 12 months . Vitals . Screenings to include cognitive, depression, and falls . Referrals and appointments  In addition, I have reviewed and discussed with patient certain preventive protocols, quality metrics, and best practice recommendations. A written personalized care plan for preventive services as well as general preventive health recommendations were provided to patient.     Wynetta Fines, RN  12/19/2016

## 2016-12-20 ENCOUNTER — Ambulatory Visit: Payer: Medicare Other

## 2016-12-25 NOTE — Progress Notes (Addendum)
Subjective:   [name] is a 83 y.o. female who presents for Medicare Annual (Subsequent) preventive examination.  The Patient was informed that the wellness visit is to identify future health risk and educate and initiate measures that can reduce risk for increased disease through the lifespan.    Annual Wellness Assessment  Reports health as good   Lives alone but has town-homes going up behind her home. One Dtr [place] and [place], [place] to move in with dtr when they get their town home Used to bowl until this year  OA in hip down to knee;  Has been encouraged to have surgery but she is not sure  Preventive Screening -Counseling & Management  Medicare Annual Preventive Care Visit - Subsequent Last OV 10/05/2016   Hx cancer of the right breast  Former smoker; quit 1970; 1 pack year hx Stopped smoking when she started having asthma   Colonoscopy 09/2013   Mammogram 04/2016 Dexa 2008  Could not find result No falls and no fx   Health Maintenance Due  Topic Date Due  . TETANUS/TDAP  07/22/2011  . INFLUENZA VACCINE  10/03/2016   Declines td and has had flu vaccine  VS reviewed;   Diet  Does not cook to much Breakfast cereal with banana Lunch; usually combination of dinner Does not get hungry anymore so she does eat 2 or 3 pm she has dinner  Very vague but states she eats  05/2016 you were 154; lost 7 lbs Gets her green vegetables   BMI 28  Exercise Swimming x 2 days a week  Goes to the aquatic center; 45 minutes;  Breast stroke x 15 minutes;  Just stopped bowling due to hip pain  Takes one room at a time    Stressors: none   Sleep patterns: sometimes she wakes up early  Pain- In left hip   Cardiac Risk Factors Addressed Hyperlipidemia - chol 216; hdl 61; ldl 134 and trig 105 Pre-diabetes BS 70 A1c 5.8  Obesity  Advanced Directives/ it's in her will do not resuscitate   Patient Care Team: Panosh, Standley Brooking, MD as PCP -  General (Internal Medicine) Jovita Kussmaul, MD (General Surgery) Mosetta Anis, MD (Allergy) Judeth Horn, MD as Referring Physician (Rheumatology)  Cardiac Risk Factors include: advanced age (>73men, >73 women);family history of premature cardiovascular disease;hypertension     Objective:     Vitals: BP 118/80   Ht 5' (1.524 m)   Wt 147 lb (66.7 kg)   BMI 28.71 kg/m   Body mass index is 28.71 kg/m.   Tobacco History  Smoking Status  . Former Smoker  . Packs/day: 0.25  . Years: 4.00  . Types: Cigarettes  . Quit date: 03/05/1968  Smokeless Tobacco  . Never Used     Counseling given: Yes   Past Medical History:  Diagnosis Date  . Allergy   . Anemia 2011   Since last visit she has had a Gi evaluation for newer onset Iron deficiiency anemia that was felt secondary to avm in duodenum rx with laser ablation.  . Arthritis    in back  . Asthma   . Cancer ([place])    rt breast  . GERD (gastroesophageal reflux disease)   . Hyperlipidemia   . Hypertension    Past Surgical History:  Procedure Laterality Date  . BREAST LUMPECTOMY  04/26/2010   right  . COLONOSCOPY WITH PROPOFOL N/A 09/14/2013   Procedure: COLONOSCOPY WITH PROPOFOL;  Surgeon:  Inda Castle, MD;  Location: [facility];  Service: Endoscopy;  Laterality: N/A;  . HOT HEMOSTASIS N/A 09/14/2013   Procedure: HOT HEMOSTASIS (ARGON PLASMA COAGULATION/BICAP);  Surgeon: Inda Castle, MD;  Location: [facility];  Service: Endoscopy;  Laterality: N/A;  . LARYNGECTOMY  20-25 yrs ago  . PARTIAL HIP ARTHROPLASTY     rt   Family History  Problem Relation Age of Onset  . Cancer Mother        breast and ovarian   History  Sexual Activity  . Sexual activity: Not on file    Outpatient Encounter Prescriptions as of 12/26/2016  Medication Sig  . amLODipine (NORVASC) 10 MG tablet TAKE 1 TABLET (10 MG TOTAL) DAILY  . budesonide-formoterol (SYMBICORT) 160-4.5 MCG/ACT inhaler Inhale 2 puffs into the lungs 2 (two)  times daily as needed (Shortness of breath).   . cycloSPORINE (RESTASIS) 0.05 % ophthalmic emulsion Place 1 drop into both eyes 2 (two) times daily.  Marland Kitchen EPIPEN 2-PAK 0.3 MG/0.3ML SOAJ injection INJ UTD PRN  . ferrous sulfate 325 (65 FE) MG tablet Take 325 mg by mouth daily with breakfast.  . fexofenadine (ALLEGRA) 180 MG tablet Take 180 mg by mouth.  Marland Kitchen KLOR-CON M20 20 MEQ tablet TAKE 1 TABLET TWICE A DAY  . lisinopril-hydrochlorothiazide (PRINZIDE,ZESTORETIC) 20-12.5 MG tablet Take 1 tablet by mouth 2 (two) times daily.  . Magnesium 500 MG CAPS Take 500 mg by mouth daily.  . montelukast (SINGULAIR) 10 MG tablet Take 10 mg by mouth at bedtime.  Marland Kitchen omeprazole (PRILOSEC) 20 MG capsule Take 1 capsule (20 mg total) by mouth daily as needed (Acid Reflux).  . simvastatin (ZOCOR) 20 MG tablet Take 1 tablet (20 mg total) by mouth at bedtime.  . solifenacin (VESICARE) 5 MG tablet Take 1 tablet (5 mg total) by mouth daily.   No facility-administered encounter medications on file as of 12/26/2016.     Activities of Daily Living In your present state of health, do you have any difficulty performing the following activities: 12/26/2016  Hearing? N  Vision? N  Difficulty concentrating or making decisions? N  Walking or climbing stairs? N  Dressing or bathing? N  Doing errands, shopping? N  Preparing Food and eating ? N  Using the Toilet? N  In the past six months, have you accidently leaked urine? N  Comment has medicine but feels she is managing  Do you have problems with loss of bowel control? N  Managing your Medications? N  Managing your Finances? N  Housekeeping or managing your Housekeeping? N  Some recent data might be hidden    Patient Care Team: Panosh, Standley Brooking, MD as PCP - General (Internal Medicine) Jovita Kussmaul, MD (General Surgery) Mosetta Anis, MD (Allergy) Judeth Horn, MD as Referring Physician (Rheumatology)    Assessment:     Exercise Activities and Dietary  recommendations Current Exercise Habits: Structured exercise class, Time (Minutes): 45, Frequency (Times/Week): 3, Weekly Exercise (Minutes/Week): 135, Intensity: Moderate  Goals    . patient          Get injections to hip so she will feel better  Keep moving and swimming       Fall Risk Fall Risk  12/26/2016 09/20/2015 06/12/2013  Falls in the past year? No No No   Depression Screen PHQ 2/9 Scores 12/26/2016 09/20/2015 06/12/2013  PHQ - 2 Score 0 0 0     Cognitive Function MMSE - Mini Mental State Exam 12/26/2016  Not completed: (No Data)  Ad8 score reviewed for issues:  Issues making decisions:  Less interest in hobbies / activities:  Repeats questions, stories (family complaining):  Trouble using ordinary gadgets (microwave, computer, phone):  Forgets the month or year:   Mismanaging finances:   Remembering appts:  Daily problems with thinking and/or memory: Ad8 score is=0 Stopped playing cards due to people relocating  Encouraged to go watch friends bowl for social engagement  Losing some of her closest friends  Stays involved in [place] her friends in [place]; they get together [place]  Goes in the 1st 2 weeks in June       Immunization History  Administered Date(s) Administered  . Influenza Split 11/16/2011  . Influenza Whole 12/06/2006, 11/25/2007, 02/03/2009, 11/21/2009  . Influenza, High Dose Seasonal PF 11/23/2014, 12/22/2015  . Influenza,inj,Quad PF,6+ Mos 11/28/2012  . Influenza-Unspecified 12/03/2013, 12/17/2016  . Pneumococcal Conjugate-13 06/12/2013  . Pneumococcal Polysaccharide-23 03/06/2003  . Td 07/21/2001   Screening Tests Health Maintenance  Topic Date Due  . TETANUS/TDAP  07/22/2011  . INFLUENZA VACCINE  10/03/2016  . DEXA SCAN  Completed  . PNA vac Low Risk Adult  Completed      Plan:     PCP Notes  Health Maintenance Had taken flu vaccine at [place] Educated regarding shingrix Declined tdap but educated  Declined  Advanced Directive but does have a DNR in her will   Abnormal Screens  None   Referrals  None   Patient concerns; none  Nurse Concerns; Not eating as much. Has  Lost some weight. Is still fairly social and travels. Swims every day  Managing her life at this time  Going to [place] in June with friends  No risk noted at this time;  LT plan to move in with dtr when they return to the area and buy a town home   Next PCP apt [place] in August        I have personally reviewed and noted the following in the patient's chart:   . Medical and social history . Use of alcohol, tobacco or illicit drugs  . Current medications and supplements . Functional ability and status . Nutritional status . Physical activity . Advanced directives . List of other physicians . Hospitalizations, surgeries, and ER visits in previous 12 months . Vitals . Screenings to include cognitive, depression, and falls . Referrals and appointments  In addition, I have reviewed and discussed with patient certain preventive protocols, quality metrics, and best practice recommendations. A written personalized care plan for preventive services as well as general preventive health recommendations were provided to patient.     YIFOY,DXAJO, RN  12/26/2016   Above noted reviewed and agree. Lottie Dawson, MD

## 2016-12-26 ENCOUNTER — Ambulatory Visit (INDEPENDENT_AMBULATORY_CARE_PROVIDER_SITE_OTHER): Payer: Medicare Other

## 2016-12-26 VITALS — BP 118/80 | Ht 60.0 in | Wt 147.0 lb

## 2016-12-26 DIAGNOSIS — J3089 Other allergic rhinitis: Secondary | ICD-10-CM | POA: Diagnosis not present

## 2016-12-26 DIAGNOSIS — Z0000 Encounter for general adult medical examination without abnormal findings: Secondary | ICD-10-CM | POA: Diagnosis not present

## 2016-12-26 DIAGNOSIS — J301 Allergic rhinitis due to pollen: Secondary | ICD-10-CM | POA: Diagnosis not present

## 2016-12-26 NOTE — Patient Instructions (Addendum)
[name] , Thank you for taking time to come for your Medicare Wellness Visit. I appreciate your ongoing commitment to your health goals. Please review the following plan we discussed and let me know if I can assist you in the future.   A Tetanus is recommended every 10 years. Medicare covers a tetanus if you have a cut or wound; otherwise, there may be a charge. If you had not had a tetanus with pertusses, known as the Tdap, you can take this anytime.   Will try to complete AD; Given copy  Referred to [facility] for questions [facility] offers free advance directive forms, as well as assistance in completing the forms themselves. For assistance, contact the Spiritual Care Department at [phone], or the Clinical Social Work Department at [phone].  Shingrix is a vaccine for the prevention of Shingles in Adults 50 and older.  If you are on Medicare, you can request a prescription from your doctor to be filled at a pharmacy.  Please check with your benefits regarding applicable copays or out of pocket expenses.  The Shingrix is given in 2 vaccines approx 8 weeks apart. You must receive the 2nd dose prior to 6 months from receipt of the first.     These are the goals we discussed: Goals    . patient          Get injections to hip so she will feel better  Keep moving and swimming        This is a list of the screening recommended for you and due dates:  Health Maintenance  Topic Date Due  . Tetanus Vaccine  07/22/2011  . Flu Shot  10/03/2016  . DEXA scan (bone density measurement)  Completed  . Pneumonia vaccines  Completed        Fall Prevention in the Home Falls can cause injuries. They can happen to people of all ages. There are many things you can do to make your home safe and to help prevent falls. What can I do on the outside of my home?  Regularly fix the edges of walkways and driveways and fix any cracks.  Remove anything that might make you trip as you  walk through a door, such as a raised step or threshold.  Trim any bushes or trees on the path to your home.  Use bright outdoor lighting.  Clear any walking paths of anything that might make someone trip, such as rocks or tools.  Regularly check to see if handrails are loose or broken. Make sure that both sides of any steps have handrails.  Any raised decks and porches should have guardrails on the edges.  Have any leaves, snow, or ice cleared regularly.  Use sand or salt on walking paths during winter.  Clean up any spills in your garage right away. This includes oil or grease spills. What can I do in the bathroom?  Use night lights.  Install grab bars by the toilet and in the tub and shower. Do not use towel bars as grab bars.  Use non-skid mats or decals in the tub or shower.  If you need to sit down in the shower, use a plastic, non-slip stool.  Keep the floor dry. Clean up any water that spills on the floor as soon as it happens.  Remove soap buildup in the tub or shower regularly.  Attach bath mats securely with double-sided non-slip rug tape.  Do not have throw rugs and other things on the  floor that can make you trip. What can I do in the bedroom?  Use night lights.  Make sure that you have a light by your bed that is easy to reach.  Do not use any sheets or blankets that are too big for your bed. They should not hang down onto the floor.  Have a firm chair that has side arms. You can use this for support while you get dressed.  Do not have throw rugs and other things on the floor that can make you trip. What can I do in the kitchen?  Clean up any spills right away.  Avoid walking on wet floors.  Keep items that you use a lot in easy-to-reach places.  If you need to reach something above you, use a strong step stool that has a grab bar.  Keep electrical cords out of the way.  Do not use floor polish or wax that makes floors slippery. If you must use  wax, use non-skid floor wax.  Do not have throw rugs and other things on the floor that can make you trip. What can I do with my stairs?  Do not leave any items on the stairs.  Make sure that there are handrails on both sides of the stairs and use them. Fix handrails that are broken or loose. Make sure that handrails are as long as the stairways.  Check any carpeting to make sure that it is firmly attached to the stairs. Fix any carpet that is loose or worn.  Avoid having throw rugs at the top or bottom of the stairs. If you do have throw rugs, attach them to the floor with carpet tape.  Make sure that you have a light switch at the top of the stairs and the bottom of the stairs. If you do not have them, ask someone to add them for you. What else can I do to help prevent falls?  Wear shoes that: ? Do not have high heels. ? Have rubber bottoms. ? Are comfortable and fit you well. ? Are closed at the toe. Do not wear sandals.  If you use a stepladder: ? Make sure that it is fully opened. Do not climb a closed stepladder. ? Make sure that both sides of the stepladder are locked into place. ? Ask someone to hold it for you, if possible.  Clearly mark and make sure that you can see: ? Any grab bars or handrails. ? First and last steps. ? Where the edge of each step is.  Use tools that help you move around (mobility aids) if they are needed. These include: ? Canes. ? Walkers. ? Scooters. ? Crutches.  Turn on the lights when you go into a dark area. Replace any light bulbs as soon as they burn out.  Set up your furniture so you have a clear path. Avoid moving your furniture around.  If any of your floors are uneven, fix them.  If there are any pets around you, be aware of where they are.  Review your medicines with your doctor. Some medicines can make you feel dizzy. This can increase your chance of falling. Ask your doctor what other things that you can do to help prevent  falls. This information is not intended to replace advice given to you by your health care provider. Make sure you discuss any questions you have with your health care provider. Document Released: 12/16/2008 Document Revised: 07/28/2015 Document Reviewed: 03/26/2014 Elsevier Interactive Patient Education  2018  [employer]  Health Maintenance, Female Adopting a healthy lifestyle and getting preventive care can go a long way to promote health and wellness. Talk with your health care provider about what schedule of regular examinations is right for you. This is a good chance for you to check in with your provider about disease prevention and staying healthy. In between checkups, there are plenty of things you can do on your own. Experts have done a lot of research about which lifestyle changes and preventive measures are most likely to keep you healthy. Ask your health care provider for more information. Weight and diet Eat a healthy diet  Be sure to include plenty of vegetables, fruits, low-fat dairy products, and lean protein.  Do not eat a lot of foods high in solid fats, added sugars, or salt.  Get regular exercise. This is one of the most important things you can do for your health. ? Most adults should exercise for at least 150 minutes each week. The exercise should increase your heart rate and make you sweat (moderate-intensity exercise). ? Most adults should also do strengthening exercises at least twice a week. This is in addition to the moderate-intensity exercise.  Maintain a healthy weight  Body mass index (BMI) is a measurement that can be used to identify possible weight problems. It estimates body fat based on height and weight. Your health care provider can help determine your BMI and help you achieve or maintain a healthy weight.  For females 31 years of age and older: ? A BMI below 18.5 is considered underweight. ? A BMI of 18.5 to 24.9 is normal. ? A BMI of 25 to 29.9 is  considered overweight. ? A BMI of 30 and above is considered obese.  Watch levels of cholesterol and blood lipids  You should start having your blood tested for lipids and cholesterol at 81 years of age, then have this test every 5 years.  You may need to have your cholesterol levels checked more often if: ? Your lipid or cholesterol levels are high. ? You are older than 81 years of age. ? You are at high risk for heart disease.  Cancer screening Lung Cancer  Lung cancer screening is recommended for adults 55-36 years old who are at high risk for lung cancer because of a history of smoking.  A yearly low-dose CT scan of the lungs is recommended for people who: ? Currently smoke. ? Have quit within the past 15 years. ? Have at least a 30-pack-year history of smoking. A pack year is smoking an average of one pack of cigarettes a day for 1 year.  Yearly screening should continue until it has been 15 years since you quit.  Yearly screening should stop if you develop a health problem that would prevent you from having lung cancer treatment.  Breast Cancer  Practice breast self-awareness. This means understanding how your breasts normally appear and feel.  It also means doing regular breast self-exams. Let your health care provider know about any changes, no matter how small.  If you are in your 20s or 30s, you should have a clinical breast exam (CBE) by a health care provider every 1-3 years as part of a regular health exam.  If you are 42 or older, have a CBE every year. Also consider having a breast X-ray (mammogram) every year.  If you have a family history of breast cancer, talk to your health care provider about genetic screening.  If you are  at high risk for breast cancer, talk to your health care provider about having an MRI and a mammogram every year.  Breast cancer gene (BRCA) assessment is recommended for women who have family members with BRCA-related cancers.  BRCA-related cancers include: ? Breast. ? Ovarian. ? Tubal. ? Peritoneal cancers.  Results of the assessment will determine the need for genetic counseling and BRCA1 and BRCA2 testing.  Cervical Cancer Your health care provider may recommend that you be screened regularly for cancer of the pelvic organs (ovaries, uterus, and vagina). This screening involves a pelvic examination, including checking for microscopic changes to the surface of your cervix (Pap test). You may be encouraged to have this screening done every 3 years, beginning at age 10.  For women ages 47-65, health care providers may recommend pelvic exams and Pap testing every 3 years, or they may recommend the Pap and pelvic exam, combined with testing for human papilloma virus (HPV), every 5 years. Some types of HPV increase your risk of cervical cancer. Testing for HPV may also be done on women of any age with unclear Pap test results.  Other health care providers may not recommend any screening for nonpregnant women who are considered low risk for pelvic cancer and who do not have symptoms. Ask your health care provider if a screening pelvic exam is right for you.  If you have had past treatment for cervical cancer or a condition that could lead to cancer, you need Pap tests and screening for cancer for at least 20 years after your treatment. If Pap tests have been discontinued, your risk factors (such as having a new sexual partner) need to be reassessed to determine if screening should resume. Some women have medical problems that increase the chance of getting cervical cancer. In these cases, your health care provider may recommend more frequent screening and Pap tests.  Colorectal Cancer  This type of cancer can be detected and often prevented.  Routine colorectal cancer screening usually begins at 81 years of age and continues through 81 years of age.  Your health care provider may recommend screening at an earlier age if  you have risk factors for colon cancer.  Your health care provider may also recommend using home test kits to check for hidden blood in the stool.  A small camera at the end of a tube can be used to examine your colon directly (sigmoidoscopy or colonoscopy). This is done to check for the earliest forms of colorectal cancer.  Routine screening usually begins at age 28.  Direct examination of the colon should be repeated every 5-10 years through 81 years of age. However, you may need to be screened more often if early forms of precancerous polyps or small growths are found.  Skin Cancer  Check your skin from head to toe regularly.  Tell your health care provider about any new moles or changes in moles, especially if there is a change in a mole's shape or color.  Also tell your health care provider if you have a mole that is larger than the size of a pencil eraser.  Always use sunscreen. Apply sunscreen liberally and repeatedly throughout the day.  Protect yourself by wearing long sleeves, pants, a wide-brimmed hat, and sunglasses whenever you are outside.  Heart disease, diabetes, and high blood pressure  High blood pressure causes heart disease and increases the risk of stroke. High blood pressure is more likely to develop in: ? People who have blood pressure in the  high end of the normal range (130-139/85-89 mm Hg). ? People who are overweight or obese. ? People who are African American.  If you are 72-62 years of age, have your blood pressure checked every 3-5 years. If you are 63 years of age or older, have your blood pressure checked every year. You should have your blood pressure measured twice-once when you are at a hospital or clinic, and once when you are not at a hospital or clinic. Record the average of the two measurements. To check your blood pressure when you are not at a hospital or clinic, you can use: ? An automated blood pressure machine at a pharmacy. ? A home blood  pressure monitor.  If you are between 86 years and 25 years old, ask your health care provider if you should take aspirin to prevent strokes.  Have regular diabetes screenings. This involves taking a blood sample to check your fasting blood sugar level. ? If you are at a normal weight and have a low risk for diabetes, have this test once every three years after 81 years of age. ? If you are overweight and have a high risk for diabetes, consider being tested at a younger age or more often. Preventing infection Hepatitis B  If you have a higher risk for hepatitis B, you should be screened for this virus. You are considered at high risk for hepatitis B if: ? You were born in a country where hepatitis B is common. Ask your health care provider which countries are considered high risk. ? Your parents were born in a high-risk country, and you have not been immunized against hepatitis B (hepatitis B vaccine). ? You have HIV or AIDS. ? You use needles to inject street drugs. ? You live with someone who has hepatitis B. ? You have had sex with someone who has hepatitis B. ? You get hemodialysis treatment. ? You take certain medicines for conditions, including cancer, organ transplantation, and autoimmune conditions.  Hepatitis C  Blood testing is recommended for: ? Everyone born from [birth year] through 1963. ? Anyone with known risk factors for hepatitis C.  Sexually transmitted infections (STIs)  You should be screened for sexually transmitted infections (STIs) including gonorrhea and chlamydia if: ? You are sexually active and are younger than 81 years of age. ? You are older than 81 years of age and your health care provider tells you that you are at risk for this type of infection. ? Your sexual activity has changed since you were last screened and you are at an increased risk for chlamydia or gonorrhea. Ask your health care provider if you are at risk.  If you do not have HIV, but are at risk,  it may be recommended that you take a prescription medicine daily to prevent HIV infection. This is called pre-exposure prophylaxis (PrEP). You are considered at risk if: ? You are sexually active and do not regularly use condoms or know the HIV status of your partner(s). ? You take drugs by injection. ? You are sexually active with a partner who has HIV.  Talk with your health care provider about whether you are at high risk of being infected with HIV. If you choose to begin PrEP, you should first be tested for HIV. You should then be tested every 3 months for as long as you are taking PrEP. Pregnancy  If you are premenopausal and you may become pregnant, ask your health care provider about preconception counseling.  If  you may become pregnant, take 400 to 800 micrograms (mcg) of folic acid every day.  If you want to prevent pregnancy, talk to your health care provider about birth control (contraception). Osteoporosis and menopause  Osteoporosis is a disease in which the bones lose minerals and strength with aging. This can result in serious bone fractures. Your risk for osteoporosis can be identified using a bone density scan.  If you are 26 years of age or older, or if you are at risk for osteoporosis and fractures, ask your health care provider if you should be screened.  Ask your health care provider whether you should take a calcium or vitamin D supplement to lower your risk for osteoporosis.  Menopause may have certain physical symptoms and risks.  Hormone replacement therapy may reduce some of these symptoms and risks. Talk to your health care provider about whether hormone replacement therapy is right for you. Follow these instructions at home:  Schedule regular health, dental, and eye exams.  Stay current with your immunizations.  Do not use any tobacco products including cigarettes, chewing tobacco, or electronic cigarettes.  If you are pregnant, do not drink  alcohol.  If you are breastfeeding, limit how much and how often you drink alcohol.  Limit alcohol intake to no more than 1 drink per day for nonpregnant women. One drink equals 12 ounces of beer, 5 ounces of wine, or 1 ounces of hard liquor.  Do not use street drugs.  Do not share needles.  Ask your health care provider for help if you need support or information about quitting drugs.  Tell your health care provider if you often feel depressed.  Tell your health care provider if you have ever been abused or do not feel safe at home. This information is not intended to replace advice given to you by your health care provider. Make sure you discuss any questions you have with your health care provider. Document Released: 09/04/2010 Document Revised: 07/28/2015 Document Reviewed: 11/23/2014 Elsevier Interactive Patient Education  [employer].

## 2016-12-28 DIAGNOSIS — M1612 Unilateral primary osteoarthritis, left hip: Secondary | ICD-10-CM | POA: Diagnosis not present

## 2017-01-02 DIAGNOSIS — J3081 Allergic rhinitis due to animal (cat) (dog) hair and dander: Secondary | ICD-10-CM | POA: Diagnosis not present

## 2017-01-02 DIAGNOSIS — M1612 Unilateral primary osteoarthritis, left hip: Secondary | ICD-10-CM | POA: Diagnosis not present

## 2017-01-02 DIAGNOSIS — J3089 Other allergic rhinitis: Secondary | ICD-10-CM | POA: Diagnosis not present

## 2017-01-02 DIAGNOSIS — J301 Allergic rhinitis due to pollen: Secondary | ICD-10-CM | POA: Diagnosis not present

## 2017-01-08 DIAGNOSIS — J3081 Allergic rhinitis due to animal (cat) (dog) hair and dander: Secondary | ICD-10-CM | POA: Diagnosis not present

## 2017-01-08 DIAGNOSIS — J3089 Other allergic rhinitis: Secondary | ICD-10-CM | POA: Diagnosis not present

## 2017-01-08 DIAGNOSIS — J301 Allergic rhinitis due to pollen: Secondary | ICD-10-CM | POA: Diagnosis not present

## 2017-01-15 ENCOUNTER — Other Ambulatory Visit: Payer: Self-pay | Admitting: General Surgery

## 2017-01-15 DIAGNOSIS — Z853 Personal history of malignant neoplasm of breast: Secondary | ICD-10-CM

## 2017-01-15 DIAGNOSIS — J301 Allergic rhinitis due to pollen: Secondary | ICD-10-CM | POA: Diagnosis not present

## 2017-01-18 DIAGNOSIS — J301 Allergic rhinitis due to pollen: Secondary | ICD-10-CM | POA: Diagnosis not present

## 2017-01-18 DIAGNOSIS — J3081 Allergic rhinitis due to animal (cat) (dog) hair and dander: Secondary | ICD-10-CM | POA: Diagnosis not present

## 2017-01-18 DIAGNOSIS — J3089 Other allergic rhinitis: Secondary | ICD-10-CM | POA: Diagnosis not present

## 2017-01-28 ENCOUNTER — Other Ambulatory Visit: Payer: Self-pay | Admitting: Internal Medicine

## 2017-01-28 DIAGNOSIS — J301 Allergic rhinitis due to pollen: Secondary | ICD-10-CM | POA: Diagnosis not present

## 2017-01-28 DIAGNOSIS — J3089 Other allergic rhinitis: Secondary | ICD-10-CM | POA: Diagnosis not present

## 2017-01-28 DIAGNOSIS — J3081 Allergic rhinitis due to animal (cat) (dog) hair and dander: Secondary | ICD-10-CM | POA: Diagnosis not present

## 2017-01-28 MED ORDER — LISINOPRIL-HYDROCHLOROTHIAZIDE 20-12.5 MG PO TABS: 1.0000 | ORAL_TABLET | Freq: Two times a day (BID) | ORAL | 1 refills | Status: DC

## 2017-01-28 NOTE — Telephone Encounter (Signed)
Medication has been refilled.

## 2017-01-28 NOTE — Telephone Encounter (Signed)
Copied from [place] [id number]. Topic: Quick Communication - Rx Refill/Question >> Jan 28, 2017 12:41 PM Lennox Solders wrote: Has the patient contacted their pharmacy? { no (Agent: If no, request that the patient contact the pharmacy for the refill.)  Preferred Pharmacy (with phone number or street name  [place] [address] [phone]

## 2017-01-28 NOTE — Telephone Encounter (Signed)
Pt of Dr Regis Bill requesting that refill for Lisinopril be sent to [facility] [address], [place]; previously sent to [facility] mail order; see CRM [phone]

## 2017-02-04 DIAGNOSIS — J3081 Allergic rhinitis due to animal (cat) (dog) hair and dander: Secondary | ICD-10-CM | POA: Diagnosis not present

## 2017-02-04 DIAGNOSIS — J301 Allergic rhinitis due to pollen: Secondary | ICD-10-CM | POA: Diagnosis not present

## 2017-02-04 DIAGNOSIS — J3089 Other allergic rhinitis: Secondary | ICD-10-CM | POA: Diagnosis not present

## 2017-02-18 DIAGNOSIS — J3089 Other allergic rhinitis: Secondary | ICD-10-CM | POA: Diagnosis not present

## 2017-02-18 DIAGNOSIS — J3081 Allergic rhinitis due to animal (cat) (dog) hair and dander: Secondary | ICD-10-CM | POA: Diagnosis not present

## 2017-02-18 DIAGNOSIS — J301 Allergic rhinitis due to pollen: Secondary | ICD-10-CM | POA: Diagnosis not present

## 2017-02-28 DIAGNOSIS — J301 Allergic rhinitis due to pollen: Secondary | ICD-10-CM | POA: Diagnosis not present

## 2017-02-28 DIAGNOSIS — J3089 Other allergic rhinitis: Secondary | ICD-10-CM | POA: Diagnosis not present

## 2017-02-28 DIAGNOSIS — J3081 Allergic rhinitis due to animal (cat) (dog) hair and dander: Secondary | ICD-10-CM | POA: Diagnosis not present

## 2017-03-08 DIAGNOSIS — J3081 Allergic rhinitis due to animal (cat) (dog) hair and dander: Secondary | ICD-10-CM | POA: Diagnosis not present

## 2017-03-08 DIAGNOSIS — J301 Allergic rhinitis due to pollen: Secondary | ICD-10-CM | POA: Diagnosis not present

## 2017-03-08 DIAGNOSIS — J3089 Other allergic rhinitis: Secondary | ICD-10-CM | POA: Diagnosis not present

## 2017-03-09 DIAGNOSIS — M25552 Pain in left hip: Secondary | ICD-10-CM | POA: Diagnosis not present

## 2017-03-09 DIAGNOSIS — M1612 Unilateral primary osteoarthritis, left hip: Secondary | ICD-10-CM | POA: Diagnosis not present

## 2017-03-13 DIAGNOSIS — J3089 Other allergic rhinitis: Secondary | ICD-10-CM | POA: Diagnosis not present

## 2017-03-13 DIAGNOSIS — J3081 Allergic rhinitis due to animal (cat) (dog) hair and dander: Secondary | ICD-10-CM | POA: Diagnosis not present

## 2017-03-13 DIAGNOSIS — J301 Allergic rhinitis due to pollen: Secondary | ICD-10-CM | POA: Diagnosis not present

## 2017-03-19 DIAGNOSIS — J3081 Allergic rhinitis due to animal (cat) (dog) hair and dander: Secondary | ICD-10-CM | POA: Diagnosis not present

## 2017-03-19 DIAGNOSIS — J3089 Other allergic rhinitis: Secondary | ICD-10-CM | POA: Diagnosis not present

## 2017-03-20 DIAGNOSIS — J3081 Allergic rhinitis due to animal (cat) (dog) hair and dander: Secondary | ICD-10-CM | POA: Diagnosis not present

## 2017-03-20 DIAGNOSIS — J3089 Other allergic rhinitis: Secondary | ICD-10-CM | POA: Diagnosis not present

## 2017-03-20 DIAGNOSIS — J301 Allergic rhinitis due to pollen: Secondary | ICD-10-CM | POA: Diagnosis not present

## 2017-03-20 DIAGNOSIS — M25552 Pain in left hip: Secondary | ICD-10-CM | POA: Diagnosis not present

## 2017-04-01 DIAGNOSIS — J3081 Allergic rhinitis due to animal (cat) (dog) hair and dander: Secondary | ICD-10-CM | POA: Diagnosis not present

## 2017-04-01 DIAGNOSIS — J3089 Other allergic rhinitis: Secondary | ICD-10-CM | POA: Diagnosis not present

## 2017-04-01 DIAGNOSIS — J301 Allergic rhinitis due to pollen: Secondary | ICD-10-CM | POA: Diagnosis not present

## 2017-04-05 DIAGNOSIS — J301 Allergic rhinitis due to pollen: Secondary | ICD-10-CM | POA: Diagnosis not present

## 2017-04-05 DIAGNOSIS — J3081 Allergic rhinitis due to animal (cat) (dog) hair and dander: Secondary | ICD-10-CM | POA: Diagnosis not present

## 2017-04-05 DIAGNOSIS — J3089 Other allergic rhinitis: Secondary | ICD-10-CM | POA: Diagnosis not present

## 2017-04-10 DIAGNOSIS — J3089 Other allergic rhinitis: Secondary | ICD-10-CM | POA: Diagnosis not present

## 2017-04-10 DIAGNOSIS — J3081 Allergic rhinitis due to animal (cat) (dog) hair and dander: Secondary | ICD-10-CM | POA: Diagnosis not present

## 2017-04-10 DIAGNOSIS — J301 Allergic rhinitis due to pollen: Secondary | ICD-10-CM | POA: Diagnosis not present

## 2017-04-12 ENCOUNTER — Other Ambulatory Visit: Payer: Self-pay | Admitting: General Surgery

## 2017-04-12 ENCOUNTER — Other Ambulatory Visit: Payer: Self-pay

## 2017-04-12 DIAGNOSIS — Z853 Personal history of malignant neoplasm of breast: Secondary | ICD-10-CM

## 2017-04-15 ENCOUNTER — Telehealth: Payer: Self-pay | Admitting: Family Medicine

## 2017-04-15 ENCOUNTER — Ambulatory Visit
Admission: RE | Admit: 2017-04-15 | Discharge: 2017-04-15 | Disposition: A | Discharge status: Home / Self Care | Payer: Medicare Other | Source: Ambulatory Visit | Attending: General Surgery | Admitting: General Surgery

## 2017-04-15 DIAGNOSIS — R928 Other abnormal and inconclusive findings on diagnostic imaging of breast: Secondary | ICD-10-CM | POA: Diagnosis not present

## 2017-04-15 DIAGNOSIS — Z853 Personal history of malignant neoplasm of breast: Secondary | ICD-10-CM

## 2017-04-15 NOTE — Telephone Encounter (Signed)
Copied from [place]. Topic: Inquiry >> Apr 15, 2017  2:07 PM Pricilla Handler wrote: Reason for CRM: [name] from [place] [phone]) called wanting to confirm if we received a fax pertaining to our patient. [name] would like a return call at [phone]. [name] states that the paperwork in the fax needs to be completed and returned to [facility].       Thank You!!!

## 2017-04-17 ENCOUNTER — Telehealth: Payer: Self-pay | Admitting: Internal Medicine

## 2017-04-17 DIAGNOSIS — J301 Allergic rhinitis due to pollen: Secondary | ICD-10-CM | POA: Diagnosis not present

## 2017-04-17 DIAGNOSIS — J3081 Allergic rhinitis due to animal (cat) (dog) hair and dander: Secondary | ICD-10-CM | POA: Diagnosis not present

## 2017-04-17 DIAGNOSIS — J3089 Other allergic rhinitis: Secondary | ICD-10-CM | POA: Diagnosis not present

## 2017-04-17 NOTE — Telephone Encounter (Signed)
Fax received.  This is in Dr Regis Bill yellow folder to sign.

## 2017-04-17 NOTE — Telephone Encounter (Signed)
Copied from [place] [id number]. Topic: Inquiry >> Apr 17, 2017  9:34 AM Cecelia Byars, NT wrote: Reason for CRM: [name]  Medical  [name] called to follow up verify his original fax was received ,he is  also re faxing today to make sure  please call him at [phone] to confirm.

## 2017-04-19 NOTE — Telephone Encounter (Signed)
[name] from [facility] called - they would like the fax to be completed using the directions on the cover sheet.  Please fax the form to either [phone] or [phone].   cb number is [phone], ask for [facility]

## 2017-04-19 NOTE — Telephone Encounter (Signed)
Signed form  Not sure qualifies   She has venous insufficieny  And swelling But I haven't treated her for any ulcers

## 2017-04-23 DIAGNOSIS — J3081 Allergic rhinitis due to animal (cat) (dog) hair and dander: Secondary | ICD-10-CM | POA: Diagnosis not present

## 2017-04-23 DIAGNOSIS — J3089 Other allergic rhinitis: Secondary | ICD-10-CM | POA: Diagnosis not present

## 2017-04-23 DIAGNOSIS — J301 Allergic rhinitis due to pollen: Secondary | ICD-10-CM | POA: Diagnosis not present

## 2017-04-26 NOTE — Telephone Encounter (Signed)
See 04/17/17 phone note.  This is a duplicate

## 2017-04-29 NOTE — Progress Notes (Signed)
Chief Complaint  Patient presents with  . Follow-up    Pt fasting, possible hip surgery    HPI: [name] 84 y.o. comes in today for exam and  meds   visit .Since last visit.  Has seen ortho dr Rushie Nyhan and  May need hip replacement left ( hjx of right )  No falling but now using cane .  bp no syncope no chnaeg  No bleeding  Change in bowel habits over the last 6-9 months   Loose  Bid   Will be normal some days  Concern  About this  Could it be cancer ? Or other problem says not taking magnesium that is on the med list .  Still driving at this time  [facility] but slowing   doesn own bills   Problem only if  missplaced the paper bill but pays on line.  Lives alone   But daughter bought her house .  Still has leg edema but  No ulcers   Health Maintenance  Topic Date Due  . TETANUS/TDAP  12/21/2017 (Originally 07/22/2011)  . INFLUENZA VACCINE  Completed  . DEXA SCAN  Completed  . PNA vac Low Risk Adult  Completed    ADLS self care lives alone Memory .   ? Placing things    ROS:  GEN/ HEENT: No fever, significant weight changes sweats headaches vision problems hearing changes, CV/ PULM; No chest pain shortness of breath cough, syncope,change in exercise tolerance. GI /GU: No adominal pain, vomiting,  No blood in the stool. No significant GU symptoms. SKIN/HEME: ,no acute skin rashes suspicious lesions or bleeding. No lymphadenopathy, nodules, masses.  NEURO/ PSYCH:  No new neurologic signs such as weakness numbness. No depression anxiety. IMM/ Allergy: No unusual infections.  Allergy .   REST of 12 system review negative except as per HPI   Past Medical History:  Diagnosis Date  . Allergy   . Anemia 2011   Since last visit she has had a Gi evaluation for newer onset Iron deficiiency anemia that was felt secondary to avm in duodenum rx with laser ablation.  . Arthritis    in back  . Asthma   . Cancer ([place])    rt breast  . GERD (gastroesophageal reflux  disease)   . Hyperlipidemia   . Hypertension     Family History  Problem Relation Age of Onset  . Cancer Mother        breast and ovarian    Social History   Socioeconomic History  . Marital status: Widowed    Spouse name: None  . Number of children: None  . Years of education: None  . Highest education level: None  Social Needs  . Financial resource strain: None  . Food insecurity - worry: None  . Food insecurity - inability: None  . Transportation needs - medical: None  . Transportation needs - non-medical: None  Occupational History  . None  Tobacco Use  . Smoking status: Former Smoker    Packs/day: 0.25    Years: 4.00    Pack years: 1.00    Types: Cigarettes    Last attempt to quit: 03/05/1968    Years since quitting: 49.1  . Smokeless tobacco: Never Used  Substance and Sexual Activity  . Alcohol use: No  . Drug use: No  . Sexual activity: None  Other Topics Concern  . None  Social History Narrative   Widowed 2001    Ex smoker  HHof 1    Active in church and volunteer activities    Bowls a few times per week   Swimming 2 x per week.    Tobacco 49=58 less than 1ppd.              Outpatient Encounter Medications as of 04/30/2017  Medication Sig  . amLODipine (NORVASC) 10 MG tablet TAKE 1 TABLET (10 MG TOTAL) DAILY  . budesonide-formoterol (SYMBICORT) 160-4.5 MCG/ACT inhaler Inhale 2 puffs into the lungs 2 (two) times daily as needed (Shortness of breath).   . cycloSPORINE (RESTASIS) 0.05 % ophthalmic emulsion Place 1 drop into both eyes 2 (two) times daily.  Marland Kitchen EPIPEN 2-PAK 0.3 MG/0.3ML SOAJ injection INJ UTD PRN  . ferrous sulfate 325 (65 FE) MG tablet Take 325 mg by mouth daily with breakfast.  . fexofenadine (ALLEGRA) 180 MG tablet Take 180 mg by mouth.  Marland Kitchen KLOR-CON M20 20 MEQ tablet TAKE 1 TABLET TWICE A DAY  . lisinopril-hydrochlorothiazide (PRINZIDE,ZESTORETIC) 20-12.5 MG tablet Take 1 tablet by mouth 2 (two) times daily.  . Magnesium 500 MG CAPS  Take 500 mg by mouth daily.  . montelukast (SINGULAIR) 10 MG tablet Take 10 mg by mouth at bedtime.  Marland Kitchen omeprazole (PRILOSEC) 20 MG capsule Take 1 capsule (20 mg total) by mouth daily as needed (Acid Reflux).  . simvastatin (ZOCOR) 20 MG tablet Take 1 tablet (20 mg total) by mouth at bedtime.  . solifenacin (VESICARE) 5 MG tablet Take 1 tablet (5 mg total) by mouth daily.   No facility-administered encounter medications on file as of 04/30/2017.     EXAM:  BP 110/82 (BP Location: Right Arm, Patient Position: Sitting, Cuff Size: Normal)   Pulse 80   Temp 97.7 F (36.5 C) (Oral)   Ht 5' (1.524 m)   Wt 140 lb 12.8 oz (63.9 kg)   BMI 27.50 kg/m   Body mass index is 27.5 kg/m.  Physical Exam: Vital signs reviewed TKW:IOXB is a well-developed well-nourished alert cooperative   who appears stated age in no acute distress.  Walks with cane but independent favoring hip .  HEENT: normocephalic atraumatic , Eyes: PERRL EOM's full, conjunctiva clear, Nares: paten,t no deformity discharge or tenderness., Ears: no deformity EAC's clear TMs with normal landmarks. Mouth: clear OP, no lesions, edema.  Moist mucous membranes. Dentition in adequate repair. NECK: supple without masses, thyromegaly or bruits. CHEST/PULM:  Clear to auscultation and percussion breath sounds equal no wheeze , rales or rhonchi. Breast no acute findings  CV: PMI is nondisplaced, S1 S2 no gallops, murmurs, rubs. Peripheral pulses present without delay.No JVD .   2-3 edema ankles no ulcers  ABDOMEN: Bowel sounds normal nontender  No guard or rebound, no hepato splenomegal no CVA tenderness.   Extremtities:  No clubbing cyanosis  no acute joint swelling or redness no focal atrophy NEURO:  Oriented x3, cranial nerves 3-12 appear to be intact, no obvious focal weakness,gait  Antalgic   Independent with cane  SKIN: No acute rashes normal turgor, color, no bruising or petechiae. PSYCH: Oriented, good eye contact, no obvious  depression anxiety, cognition and judgment appear normal. LN: no cervical axillary iadenopathy No noted deficits in memory, attention, and speech.  Grossly noted    Lab Results  Component Value Date   WBC 7.0 10/05/2016   HGB 12.3 10/05/2016   HCT 39.0 10/05/2016   PLT 370.0 10/05/2016   GLUCOSE 78 10/05/2016   CHOL 216 (H) 04/05/2016   TRIG 105.0 04/05/2016  HDL 61.30 04/05/2016   LDLCALC 134 (H) 04/05/2016   ALT 12 04/05/2016   AST 22 04/05/2016   NA 140 10/05/2016   K 4.3 10/05/2016   CL 102 10/05/2016   CREATININE 0.71 10/05/2016   BUN 9 10/05/2016   CO2 31 10/05/2016   TSH 0.73 04/05/2016   HGBA1C 5.8 10/05/2016   Wt Readings from Last 3 Encounters:  04/30/17 140 lb 12.8 oz (63.9 kg)  12/26/16 147 lb (66.7 kg)  10/05/16 145 lb 12.8 oz (66.1 kg)     ASSESSMENT AND PLAN:  Discussed the following assessment and plan:  Well-controlled hypertension - Plan: Basic metabolic panel, CBC with Differential/Platelet, Hemoglobin A1c, Hepatic function panel, Lipid panel, TSH, T4, free, Magnesium  Medication management - Plan: Basic metabolic panel, CBC with Differential/Platelet, Hemoglobin A1c, Hepatic function panel, Lipid panel, TSH, T4, free, Magnesium  Edema, unspecified type - Plan: Basic metabolic panel, CBC with Differential/Platelet, Hemoglobin A1c, Hepatic function panel, Lipid panel, TSH, T4, free, Magnesium  Hyperlipidemia, unspecified hyperlipidemia type - Plan: Basic metabolic panel, CBC with Differential/Platelet, Hemoglobin A1c, Hepatic function panel, Lipid panel, TSH, T4, free, Magnesium  HX: breast cancer - Plan: Basic metabolic panel, CBC with Differential/Platelet, Hemoglobin A1c, Hepatic function panel, Lipid panel, TSH, T4, free, Magnesium  Change in bowel habits - Plan: Basic metabolic panel, CBC with Differential/Platelet, Hemoglobin A1c, Hepatic function panel, Lipid panel, TSH, T4, free, Magnesium  Loose stools - Plan: Basic metabolic panel, CBC  with Differential/Platelet, Hemoglobin A1c, Hepatic function panel, Lipid panel, TSH, T4, free, Magnesium  Elevated blood sugar level - Plan: Basic metabolic panel, CBC with Differential/Platelet, Hemoglobin A1c, Hepatic function panel, Lipid panel, TSH, T4, free, Magnesium  Hip arthritis   Gi referral about the change bowel habits   ? If from med  Not on mag   Concern about  Her Cancer as cause.   Will prob got TKA  In next 6 months based on her sx .   If cleared medically for surgery . Patient Care Team: Deshae Dickison, Standley Brooking, MD as PCP - General (Internal Medicine) Jovita Kussmaul, MD (General Surgery) Mosetta Anis, MD (Allergy) Judeth Horn, MD as Referring Physician (Rheumatology)  Patient Instructions  Labs today and   Will get a gi referral about the loose stools and change in  Bowel habits .   If blood pressure goes too low we will decrease your BP medication doseing  Magnesium can cause loose stools    And we sometimes advise this if potassium is too low     Make sure not taking magnesium  At this time .    Plan fu 6 months or as needed .        Standley Brooking. Bienvenido Proehl M.D.

## 2017-04-29 NOTE — Telephone Encounter (Signed)
Caller name: [name]  [facility] to [facility]B:[facility]  Call back number: [phone] fax # [phone] and [phone]    Reason for call:  Medcal necessity form received by [employer]. Please make the following corrections and fax back to GL; section B there's a small section;  dx code was not marked, please mark I87.2 (underneath dx code 5 questions listed,  question 1 and 2 please mark yes to correspond with the Dx code.) please fax back to both fax #s listed above and leave detail message on [name] VM informing updated form has been faxed as per he's request.

## 2017-04-30 ENCOUNTER — Encounter: Payer: Self-pay | Admitting: Internal Medicine

## 2017-04-30 ENCOUNTER — Ambulatory Visit (INDEPENDENT_AMBULATORY_CARE_PROVIDER_SITE_OTHER): Payer: Medicare Other | Admitting: Internal Medicine

## 2017-04-30 VITALS — BP 110/82 | HR 80 | Temp 97.7°F | Ht 60.0 in | Wt 140.8 lb

## 2017-04-30 DIAGNOSIS — Z79899 Other long term (current) drug therapy: Secondary | ICD-10-CM | POA: Diagnosis not present

## 2017-04-30 DIAGNOSIS — R609 Edema, unspecified: Secondary | ICD-10-CM | POA: Diagnosis not present

## 2017-04-30 DIAGNOSIS — R194 Change in bowel habit: Secondary | ICD-10-CM

## 2017-04-30 DIAGNOSIS — R739 Hyperglycemia, unspecified: Secondary | ICD-10-CM

## 2017-04-30 DIAGNOSIS — I10 Essential (primary) hypertension: Secondary | ICD-10-CM

## 2017-04-30 DIAGNOSIS — M1610 Unilateral primary osteoarthritis, unspecified hip: Secondary | ICD-10-CM

## 2017-04-30 DIAGNOSIS — Z853 Personal history of malignant neoplasm of breast: Secondary | ICD-10-CM

## 2017-04-30 DIAGNOSIS — R195 Other fecal abnormalities: Secondary | ICD-10-CM

## 2017-04-30 DIAGNOSIS — E785 Hyperlipidemia, unspecified: Secondary | ICD-10-CM | POA: Diagnosis not present

## 2017-04-30 DIAGNOSIS — J3089 Other allergic rhinitis: Secondary | ICD-10-CM | POA: Diagnosis not present

## 2017-04-30 DIAGNOSIS — J3081 Allergic rhinitis due to animal (cat) (dog) hair and dander: Secondary | ICD-10-CM | POA: Diagnosis not present

## 2017-04-30 DIAGNOSIS — J301 Allergic rhinitis due to pollen: Secondary | ICD-10-CM | POA: Diagnosis not present

## 2017-04-30 LAB — CBC WITH DIFFERENTIAL/PLATELET
Basophils Absolute: 0 10*3/uL (ref 0.0–0.1)
Basophils Relative: 0.4 % (ref 0.0–3.0)
Eosinophils Absolute: 0.1 10*3/uL (ref 0.0–0.7)
Eosinophils Relative: 2 % (ref 0.0–5.0)
HCT: 41 % (ref 36.0–46.0)
Hemoglobin: 13.4 g/dL (ref 12.0–15.0)
Lymphocytes Relative: 13.6 % (ref 12.0–46.0)
Lymphs Abs: 1 10*3/uL (ref 0.7–4.0)
MCHC: 32.8 g/dL (ref 30.0–36.0)
MCV: 84.6 fl (ref 78.0–100.0)
Monocytes Absolute: 0.6 10*3/uL (ref 0.1–1.0)
Monocytes Relative: 7.6 % (ref 3.0–12.0)
Neutro Abs: 5.7 10*3/uL (ref 1.4–7.7)
Neutrophils Relative %: 76.4 % (ref 43.0–77.0)
Platelets: 378 10*3/uL (ref 150.0–400.0)
RBC: 4.84 Mil/uL (ref 3.87–5.11)
RDW: 15.2 % (ref 11.5–15.5)
WBC: 7.5 10*3/uL (ref 4.0–10.5)

## 2017-04-30 LAB — HEPATIC FUNCTION PANEL
ALT: 12 U/L (ref 0–35)
AST: 15 U/L (ref 0–37)
Albumin: 3.8 g/dL (ref 3.5–5.2)
Alkaline Phosphatase: 63 U/L (ref 39–117)
Bilirubin, Direct: 0.1 mg/dL (ref 0.0–0.3)
Total Bilirubin: 0.4 mg/dL (ref 0.2–1.2)
Total Protein: 7.2 g/dL (ref 6.0–8.3)

## 2017-04-30 LAB — BASIC METABOLIC PANEL
BUN: 10 mg/dL (ref 6–23)
CO2: 32 mEq/L (ref 19–32)
Calcium: 10 mg/dL (ref 8.4–10.5)
Chloride: 101 mEq/L (ref 96–112)
Creatinine, Ser: 0.68 mg/dL (ref 0.40–1.20)
GFR: 105.79 mL/min (ref >60.00)
Glucose, Bld: 77 mg/dL (ref 70–99)
Potassium: 4 mEq/L (ref 3.5–5.1)
Sodium: 140 mEq/L (ref 135–145)

## 2017-04-30 LAB — LIPID PANEL
Cholesterol: 260 mg/dL — ABNORMAL HIGH (ref 0–200)
HDL: 72 mg/dL (ref >39.00)
LDL Cholesterol: 163 mg/dL — ABNORMAL HIGH (ref 0–99)
NonHDL: 187.97
Total CHOL/HDL Ratio: 4
Triglycerides: 127 mg/dL (ref 0.0–149.0)
VLDL: 25.4 mg/dL (ref 0.0–40.0)

## 2017-04-30 LAB — HEMOGLOBIN A1C: Hgb A1c MFr Bld: 5.6 % (ref 4.6–6.5)

## 2017-04-30 LAB — TSH: TSH: 0.19 u[IU]/mL — ABNORMAL LOW (ref 0.35–4.50)

## 2017-04-30 LAB — MAGNESIUM: Magnesium: 2.1 mg/dL (ref 1.5–2.5)

## 2017-04-30 LAB — T4, FREE: Free T4: 0.66 ng/dL (ref 0.60–1.60)

## 2017-04-30 NOTE — Patient Instructions (Addendum)
Labs today and   Will get a gi referral about the loose stools and change in  Bowel habits .   If blood pressure goes too low we will decrease your BP medication doseing  Magnesium can cause loose stools    And we sometimes advise this if potassium is too low     Make sure not taking magnesium  At this time .    Plan fu 6 months or as needed .

## 2017-05-01 NOTE — Telephone Encounter (Signed)
LM for [name] making aware that I got his message and I wanted to know what exactly is needed on the forms.  Per Dr Regis Bill and her notes on the form she cannot check YES for the questions #1 and 2 as the patient does not have the diagnosis associated.  Will hold in my box to wait for call back from [place] at [employer].

## 2017-05-03 ENCOUNTER — Encounter: Payer: Self-pay | Admitting: Gastroenterology

## 2017-05-03 ENCOUNTER — Ambulatory Visit (INDEPENDENT_AMBULATORY_CARE_PROVIDER_SITE_OTHER): Payer: Medicare Other | Admitting: Gastroenterology

## 2017-05-03 VITALS — BP 130/60 | HR 72 | Ht 59.0 in | Wt 142.0 lb

## 2017-05-03 DIAGNOSIS — K529 Noninfective gastroenteritis and colitis, unspecified: Secondary | ICD-10-CM | POA: Diagnosis not present

## 2017-05-03 NOTE — Progress Notes (Signed)
GI Progress Note  Chief Complaint: diarrhea  Subjective  History: This is an 84 year old woman sent back to me by her primary care doctor for intermittent diarrhea.  I last saw her a year ago for throat pain that did not sound GI in nature.  She is here today for perhaps 8 months of diarrhea.  However, she says she does not really consider it diarrhea, and she is a poor historian.  She also seems to have some difficulty with her memory.  She is uncertain whether or not she is still taking certain things that are listed as her active medicines.  She believes she is no longer taking magnesium, is not certain if she still takes a PPI, but is definitely taking iron.  She says she has done this for many years because without it she will become anemic. Vantasia reports a single semi-formed to loose bowel movement perhaps 15-30 minutes after having her morning meal of cereal or oatmeal with milk.  She has no more BMs the remainder of the day, no known diarrhea, no fecal incontinence, no bleeding, no nausea or vomiting.  She says her appetite has only been fair for years, she has to remind herself to eat later in the day and food does not have much taste.  She was uncertain if she had lost any weight, she appears to be down 5 pounds from last October.  ROS: Cardiovascular:  no chest pain Respiratory: no dyspnea Remainder systems are negative as near as can be determined. The patient's Past Medical, Family and Social History were reviewed and are on file in the EMR.  Objective:  Med list reviewed  Current Outpatient Medications:  .  amLODipine (NORVASC) 10 MG tablet, TAKE 1 TABLET (10 MG TOTAL) DAILY, Disp: 90 tablet, Rfl: 1 .  budesonide-formoterol (SYMBICORT) 160-4.5 MCG/ACT inhaler, Inhale 2 puffs into the lungs 2 (two) times daily as needed (Shortness of breath). , Disp: , Rfl:  .  cycloSPORINE (RESTASIS) 0.05 % ophthalmic emulsion, Place 1 drop into both eyes 2 (two) times  daily., Disp: , Rfl:  .  EPIPEN 2-PAK 0.3 MG/0.3ML SOAJ injection, INJ UTD PRN, Disp: , Rfl: 1 .  ferrous sulfate 325 (65 FE) MG tablet, Take 325 mg by mouth daily with breakfast., Disp: , Rfl:  .  fexofenadine (ALLEGRA) 180 MG tablet, Take 180 mg by mouth., Disp: , Rfl:  .  KLOR-CON M20 20 MEQ tablet, TAKE 1 TABLET TWICE A DAY, Disp: 180 tablet, Rfl: 0 .  lisinopril-hydrochlorothiazide (PRINZIDE,ZESTORETIC) 20-12.5 MG tablet, Take 1 tablet by mouth 2 (two) times daily., Disp: 180 tablet, Rfl: 1 .  Magnesium 500 MG CAPS, Take 500 mg by mouth daily., Disp: , Rfl:  .  montelukast (SINGULAIR) 10 MG tablet, Take 10 mg by mouth at bedtime., Disp: , Rfl:  .  omeprazole (PRILOSEC) 20 MG capsule, Take 1 capsule (20 mg total) by mouth daily as needed (Acid Reflux)., Disp: 90 capsule, Rfl: 2 .  simvastatin (ZOCOR) 20 MG tablet, Take 1 tablet (20 mg total) by mouth at bedtime., Disp: 90 tablet, Rfl: 3 .  solifenacin (VESICARE) 5 MG tablet, Take 1 tablet (5 mg total) by mouth daily., Disp: 90 tablet, Rfl: 3   Vital signs in last 24 hrs: Vitals:   05/03/17 1337  BP: 130/60  Pulse: 72    Physical Exam  Elderly woman, no muscle wasting.  Gets on exam table slowly but without assistance.  HEENT: sclera anicteric, oral mucosa moist  without lesions  Neck: supple, no thyromegaly, JVD or lymphadenopathy  Cardiac: RRR without murmurs, S1S2 heard, mild ankle peripheral edema  Pulm: clear to auscultation bilaterally, normal RR and effort noted  Abdomen: soft, no tenderness, with active bowel sounds. No guarding or palpable hepatosplenomegaly.  No bruit  Skin; warm and dry, no jaundice or rash  Recent Labs:  CBC Latest Ref Rng & Units 04/30/2017 10/05/2016 09/02/2014  WBC 4.0 - 10.5 K/uL 7.5 7.0 9.0  Hemoglobin 12.0 - 15.0 g/dL 13.4 12.3 13.0  Hematocrit 36.0 - 46.0 % 41.0 39.0 40.0  Platelets 150.0 - 400.0 K/uL 378.0 370.0 311.0   CMP Latest Ref Rng & Units 04/30/2017 10/05/2016 04/05/2016  Glucose 70 -  99 mg/dL 77 78 85  BUN 6 - 23 mg/dL 10 9 11   Creatinine 0.40 - 1.20 mg/dL 0.68 0.71 0.64  Sodium 135 - 145 mEq/L 140 140 143  Potassium 3.5 - 5.1 mEq/L 4.0 4.3 3.6  Chloride 96 - 112 mEq/L 101 102 103  CO2 19 - 32 mEq/L 32 31 32  Calcium 8.4 - 10.5 mg/dL 10.0 9.6 9.4  Total Protein 6.0 - 8.3 g/dL 7.2 - 6.8  Total Bilirubin 0.2 - 1.2 mg/dL 0.4 - 0.4  Alkaline Phos 39 - 117 U/L 63 - 75  AST 0 - 37 U/L 15 - 22  ALT 0 - 35 U/L 12 - 12   TSH low at 0.19, Free T4 low normal at 0.66  (similar to her prior values)  Radiologic studies: None for review   @ASSESSMENTPLANBEGIN @ Assessment: Encounter Diagnosis  Name Primary?  . Chronic diarrhea Yes   This diarrhea is very difficult to characterize based on the history she is able to give.  It is also uncertain whether this modest weight loss is related to it or from age and perhaps memory related decrease in oral intake.  It does not sound typical for malabsorption, only occurring after her morning meal.  I still wonder if there may be a side effect of medicines, and it is not certain why she still needs iron.  I suggested just a week off of that would not cause her to become anemic and might help determine if it is related.  She also thinks it may have something to do with the milk she has in her cereal.  Perhaps she has become somewhat lactose intolerant, so I recommended a week of either lactose-free milk or off dairy altogether. She does not have risk factors for small bowel bacterial overgrowth or pancreatic insufficiency.  There is no abdominal pain rectal bleeding or anemia to suggest a harmful cause for the symptoms. I would prefer not to put her through an invasive endoscopic workup with her age and condition and she agrees.  Recommendations as above, also add a daily fiber supplement since she may not be getting enough in her limited diet.  I will be glad to revisit this as the need arises, especially should if she develops progressive  weight loss, rectal bleeding, abdominal pain, anemia or other alarming symptoms.  Total time 30 minutes, over half spent in counseling and coordination of care.   [name]

## 2017-05-03 NOTE — Patient Instructions (Signed)
If you are age 82 or older, your body mass index should be between 23-30. Your Body mass index is 28.68 kg/m. If this is out of the aforementioned range listed, please consider follow up with your Primary Care Provider.  If you are age 69 or younger, your body mass index should be between 19-25. Your Body mass index is 28.68 kg/m. If this is out of the aformentioned range listed, please consider follow up with your Primary Care Provider.   Follow up as needed. [phone]  Thank you for choosing [facility]  Dr Wilfrid Lund III

## 2017-05-04 DIAGNOSIS — M25552 Pain in left hip: Secondary | ICD-10-CM | POA: Diagnosis not present

## 2017-05-06 DIAGNOSIS — J3089 Other allergic rhinitis: Secondary | ICD-10-CM | POA: Diagnosis not present

## 2017-05-06 DIAGNOSIS — J3081 Allergic rhinitis due to animal (cat) (dog) hair and dander: Secondary | ICD-10-CM | POA: Diagnosis not present

## 2017-05-06 DIAGNOSIS — J301 Allergic rhinitis due to pollen: Secondary | ICD-10-CM | POA: Diagnosis not present

## 2017-05-09 NOTE — Telephone Encounter (Signed)
Will send to Dr Regis Bill to review form in red folder. Note attached with specifications from [place] at [employer].  If unable to answer check YES for the questions #1 and #2, who do we need to defer the forms to complete them as requested?

## 2017-05-09 NOTE — Telephone Encounter (Signed)
dont know  But she has seen vascular  Specialist in past   May be in the ehr.

## 2017-05-14 DIAGNOSIS — J3089 Other allergic rhinitis: Secondary | ICD-10-CM | POA: Diagnosis not present

## 2017-05-14 DIAGNOSIS — J3081 Allergic rhinitis due to animal (cat) (dog) hair and dander: Secondary | ICD-10-CM | POA: Diagnosis not present

## 2017-05-14 DIAGNOSIS — J301 Allergic rhinitis due to pollen: Secondary | ICD-10-CM | POA: Diagnosis not present

## 2017-05-16 NOTE — Telephone Encounter (Signed)
[name] aware that we are unable to change our answers on the form to meet their request.

## 2017-05-20 DIAGNOSIS — J301 Allergic rhinitis due to pollen: Secondary | ICD-10-CM | POA: Diagnosis not present

## 2017-05-20 DIAGNOSIS — J3089 Other allergic rhinitis: Secondary | ICD-10-CM | POA: Diagnosis not present

## 2017-05-20 DIAGNOSIS — M1612 Unilateral primary osteoarthritis, left hip: Secondary | ICD-10-CM | POA: Diagnosis not present

## 2017-05-20 DIAGNOSIS — J3081 Allergic rhinitis due to animal (cat) (dog) hair and dander: Secondary | ICD-10-CM | POA: Diagnosis not present

## 2017-05-28 DIAGNOSIS — J301 Allergic rhinitis due to pollen: Secondary | ICD-10-CM | POA: Diagnosis not present

## 2017-05-28 DIAGNOSIS — J3089 Other allergic rhinitis: Secondary | ICD-10-CM | POA: Diagnosis not present

## 2017-05-28 DIAGNOSIS — J3081 Allergic rhinitis due to animal (cat) (dog) hair and dander: Secondary | ICD-10-CM | POA: Diagnosis not present

## 2017-05-29 ENCOUNTER — Other Ambulatory Visit: Payer: Self-pay | Admitting: Internal Medicine

## 2017-06-04 DIAGNOSIS — J3089 Other allergic rhinitis: Secondary | ICD-10-CM | POA: Diagnosis not present

## 2017-06-04 DIAGNOSIS — J301 Allergic rhinitis due to pollen: Secondary | ICD-10-CM | POA: Diagnosis not present

## 2017-06-04 DIAGNOSIS — J3081 Allergic rhinitis due to animal (cat) (dog) hair and dander: Secondary | ICD-10-CM | POA: Diagnosis not present

## 2017-06-14 DIAGNOSIS — J3081 Allergic rhinitis due to animal (cat) (dog) hair and dander: Secondary | ICD-10-CM | POA: Diagnosis not present

## 2017-06-14 DIAGNOSIS — J301 Allergic rhinitis due to pollen: Secondary | ICD-10-CM | POA: Diagnosis not present

## 2017-06-14 DIAGNOSIS — J3089 Other allergic rhinitis: Secondary | ICD-10-CM | POA: Diagnosis not present

## 2017-06-19 DIAGNOSIS — J3081 Allergic rhinitis due to animal (cat) (dog) hair and dander: Secondary | ICD-10-CM | POA: Diagnosis not present

## 2017-06-19 DIAGNOSIS — J301 Allergic rhinitis due to pollen: Secondary | ICD-10-CM | POA: Diagnosis not present

## 2017-06-19 DIAGNOSIS — J3089 Other allergic rhinitis: Secondary | ICD-10-CM | POA: Diagnosis not present

## 2017-06-26 DIAGNOSIS — J3081 Allergic rhinitis due to animal (cat) (dog) hair and dander: Secondary | ICD-10-CM | POA: Diagnosis not present

## 2017-06-26 DIAGNOSIS — J301 Allergic rhinitis due to pollen: Secondary | ICD-10-CM | POA: Diagnosis not present

## 2017-06-26 DIAGNOSIS — J3089 Other allergic rhinitis: Secondary | ICD-10-CM | POA: Diagnosis not present

## 2017-07-03 DIAGNOSIS — J301 Allergic rhinitis due to pollen: Secondary | ICD-10-CM | POA: Diagnosis not present

## 2017-07-03 DIAGNOSIS — J3081 Allergic rhinitis due to animal (cat) (dog) hair and dander: Secondary | ICD-10-CM | POA: Diagnosis not present

## 2017-07-03 DIAGNOSIS — J3089 Other allergic rhinitis: Secondary | ICD-10-CM | POA: Diagnosis not present

## 2017-07-12 DIAGNOSIS — J301 Allergic rhinitis due to pollen: Secondary | ICD-10-CM | POA: Diagnosis not present

## 2017-07-12 DIAGNOSIS — J3081 Allergic rhinitis due to animal (cat) (dog) hair and dander: Secondary | ICD-10-CM | POA: Diagnosis not present

## 2017-07-12 DIAGNOSIS — J3089 Other allergic rhinitis: Secondary | ICD-10-CM | POA: Diagnosis not present

## 2017-07-25 ENCOUNTER — Other Ambulatory Visit: Payer: Self-pay | Admitting: Internal Medicine

## 2017-07-25 DIAGNOSIS — J301 Allergic rhinitis due to pollen: Secondary | ICD-10-CM | POA: Diagnosis not present

## 2017-07-25 DIAGNOSIS — J3081 Allergic rhinitis due to animal (cat) (dog) hair and dander: Secondary | ICD-10-CM | POA: Diagnosis not present

## 2017-08-01 DIAGNOSIS — J3089 Other allergic rhinitis: Secondary | ICD-10-CM | POA: Diagnosis not present

## 2017-08-01 DIAGNOSIS — J3081 Allergic rhinitis due to animal (cat) (dog) hair and dander: Secondary | ICD-10-CM | POA: Diagnosis not present

## 2017-08-01 DIAGNOSIS — J301 Allergic rhinitis due to pollen: Secondary | ICD-10-CM | POA: Diagnosis not present

## 2017-08-07 DIAGNOSIS — M25559 Pain in unspecified hip: Secondary | ICD-10-CM | POA: Diagnosis not present

## 2017-08-07 DIAGNOSIS — M25519 Pain in unspecified shoulder: Secondary | ICD-10-CM | POA: Diagnosis not present

## 2017-08-07 DIAGNOSIS — M2550 Pain in unspecified joint: Secondary | ICD-10-CM | POA: Diagnosis not present

## 2017-08-07 DIAGNOSIS — M549 Dorsalgia, unspecified: Secondary | ICD-10-CM | POA: Diagnosis not present

## 2017-08-07 DIAGNOSIS — M1990 Unspecified osteoarthritis, unspecified site: Secondary | ICD-10-CM | POA: Diagnosis not present

## 2017-08-09 DIAGNOSIS — J3089 Other allergic rhinitis: Secondary | ICD-10-CM | POA: Diagnosis not present

## 2017-08-09 DIAGNOSIS — J301 Allergic rhinitis due to pollen: Secondary | ICD-10-CM | POA: Diagnosis not present

## 2017-08-16 DIAGNOSIS — J3089 Other allergic rhinitis: Secondary | ICD-10-CM | POA: Diagnosis not present

## 2017-08-16 DIAGNOSIS — J3081 Allergic rhinitis due to animal (cat) (dog) hair and dander: Secondary | ICD-10-CM | POA: Diagnosis not present

## 2017-08-16 DIAGNOSIS — J301 Allergic rhinitis due to pollen: Secondary | ICD-10-CM | POA: Diagnosis not present

## 2017-08-22 DIAGNOSIS — J301 Allergic rhinitis due to pollen: Secondary | ICD-10-CM | POA: Diagnosis not present

## 2017-08-22 DIAGNOSIS — J3089 Other allergic rhinitis: Secondary | ICD-10-CM | POA: Diagnosis not present

## 2017-08-29 DIAGNOSIS — J3089 Other allergic rhinitis: Secondary | ICD-10-CM | POA: Diagnosis not present

## 2017-08-29 DIAGNOSIS — J301 Allergic rhinitis due to pollen: Secondary | ICD-10-CM | POA: Diagnosis not present

## 2017-09-03 DIAGNOSIS — J301 Allergic rhinitis due to pollen: Secondary | ICD-10-CM | POA: Diagnosis not present

## 2017-09-03 DIAGNOSIS — J3089 Other allergic rhinitis: Secondary | ICD-10-CM | POA: Diagnosis not present

## 2017-09-18 DIAGNOSIS — J301 Allergic rhinitis due to pollen: Secondary | ICD-10-CM | POA: Diagnosis not present

## 2017-09-18 DIAGNOSIS — J3089 Other allergic rhinitis: Secondary | ICD-10-CM | POA: Diagnosis not present

## 2017-09-26 DIAGNOSIS — M7989 Other specified soft tissue disorders: Secondary | ICD-10-CM | POA: Diagnosis not present

## 2017-10-02 ENCOUNTER — Telehealth: Payer: Self-pay | Admitting: Family Medicine

## 2017-10-02 DIAGNOSIS — J3089 Other allergic rhinitis: Secondary | ICD-10-CM | POA: Diagnosis not present

## 2017-10-02 DIAGNOSIS — J301 Allergic rhinitis due to pollen: Secondary | ICD-10-CM | POA: Diagnosis not present

## 2017-10-02 NOTE — Telephone Encounter (Signed)
Copied from [place] [id number]. Topic: General - Other >> Oct 02, 2017  2:38 PM Keene Breath wrote: Reason for CRM: Patient's daughter called to verify if Dr. Regis Bill received the results of her mother's ultrasound from 09/26/17.  Test was done at [facility] by Dr. Ace Gins.  Please confirm if the results have been received.  CB# [phone].

## 2017-10-04 ENCOUNTER — Telehealth: Payer: Self-pay | Admitting: Family Medicine

## 2017-10-04 NOTE — Telephone Encounter (Signed)
Duplicate message See 06/16/21 open encounter

## 2017-10-04 NOTE — Telephone Encounter (Signed)
Copied from [place] [phone]. Topic: Quick Communication - See Telephone Encounter >> Oct 04, 2017 10:12 AM Marja Kays F wrote: Pt is calling to see about ultrasound resuts   Best number [phone]

## 2017-10-07 NOTE — Telephone Encounter (Addendum)
Results loaded in [place]. I had to go through some steps to get authorization for the records to flow over so that we could view them. Pt daughter Lowella Bandy aware via voicemail.  Please advise Dr Regis Bill, thanks.

## 2017-10-08 ENCOUNTER — Telehealth: Payer: Self-pay | Admitting: Family Medicine

## 2017-10-08 NOTE — Telephone Encounter (Signed)
Copied from [place] [id number]. Topic: General - Other >> Oct 02, 2017  2:38 PM Keene Breath wrote: Reason for CRM: Patient's daughter called to verify if Dr. Regis Bill received the results of her mother's ultrasound from 09/26/17.  Test was done at [facility] by Dr. Ace Gins.  Please confirm if the results have been received.  CB# [phone].  >> Oct 08, 2017 11:46 AM Yvette Rack wrote: Pt calling to see if Dr Regis Bill had received pt xrays of her legs from Dr Sherlene Shams office that she had done last week

## 2017-10-08 NOTE — Telephone Encounter (Signed)
See Previous note which is still open - this message will need to be closed as it is a duplicate.  Pt daughter has been notified that we do have the results in [place].   Please call daughter back and relay this message. Thanks.

## 2017-10-08 NOTE — Telephone Encounter (Signed)
I left a voice message, yes we have received the results.

## 2017-10-11 DIAGNOSIS — J3089 Other allergic rhinitis: Secondary | ICD-10-CM | POA: Diagnosis not present

## 2017-10-11 DIAGNOSIS — J301 Allergic rhinitis due to pollen: Secondary | ICD-10-CM | POA: Diagnosis not present

## 2017-10-16 DIAGNOSIS — J301 Allergic rhinitis due to pollen: Secondary | ICD-10-CM | POA: Diagnosis not present

## 2017-10-16 DIAGNOSIS — J3089 Other allergic rhinitis: Secondary | ICD-10-CM | POA: Diagnosis not present

## 2017-10-16 NOTE — Telephone Encounter (Addendum)
Per Dr Regis Bill, needs to schedule an OV to discuss [name] relayed this message to patient per Dr Regis Bill.  Scheduled with Dr Regis Bill on 10/18/17 at 915am  Nothing further needed.

## 2017-10-16 NOTE — Telephone Encounter (Signed)
I see that the [place] of her legs show no DVTS . And there are notes of dr Nadene Rubins.    Please advise  If  She  is concerned that  that we need to  Reassess   Status .   Also last  Labs shoed  Her thryoid was off   Did she  See an endocrinologist yet?  If not labs will need to be repeated    Notes recorded by Matilde Sprang, RN on 05/14/2017 at 3:19 PM EDT Pt given lab results per notes of Dr. Regis Bill on 05/08/17. Pt verbalized understanding. She says "I don't remember who the endocrinologist was, but I want to think about this referral and I will call you back."   ------  Notes recorded by Virl Cagey, CMA on 05/09/2017 at 12:26 PM EST LM x 1 ------  Notes recorded by Burnis Medin, MD on 05/08/2017 at 6:20 PM EST Blood work  Cholesterol is not as good As in past  And thyroid seem to be off again  Sn overactive   It is possible that thyroid is causing a bit of weight loss but not sure. Advise we have her see endocrinologist again and am not sure who she saw in the past. Please do referral to endocrine of choice  For Low tsh

## 2017-10-18 ENCOUNTER — Encounter: Payer: Self-pay | Admitting: Internal Medicine

## 2017-10-18 ENCOUNTER — Ambulatory Visit (INDEPENDENT_AMBULATORY_CARE_PROVIDER_SITE_OTHER): Payer: Medicare Other | Admitting: Internal Medicine

## 2017-10-18 VITALS — BP 142/70 | HR 78 | Wt 140.8 lb

## 2017-10-18 DIAGNOSIS — R609 Edema, unspecified: Secondary | ICD-10-CM | POA: Diagnosis not present

## 2017-10-18 DIAGNOSIS — M25552 Pain in left hip: Secondary | ICD-10-CM

## 2017-10-18 DIAGNOSIS — E0590 Thyrotoxicosis, unspecified without thyrotoxic crisis or storm: Secondary | ICD-10-CM

## 2017-10-18 LAB — BASIC METABOLIC PANEL
BUN: 15 mg/dL (ref 6–23)
CO2: 32 mEq/L (ref 19–32)
Calcium: 9.7 mg/dL (ref 8.4–10.5)
Chloride: 103 mEq/L (ref 96–112)
Creatinine, Ser: 0.68 mg/dL (ref 0.40–1.20)
GFR: 105.67 mL/min (ref >60.00)
Glucose, Bld: 63 mg/dL — ABNORMAL LOW (ref 70–99)
Potassium: 3.7 mEq/L (ref 3.5–5.1)
Sodium: 142 mEq/L (ref 135–145)

## 2017-10-18 LAB — POC URINALSYSI DIPSTICK (AUTOMATED)
Bilirubin, UA: NEGATIVE
Blood, UA: NEGATIVE
Glucose, UA: NEGATIVE
Ketones, UA: NEGATIVE
Nitrite, UA: NEGATIVE
Protein, UA: NEGATIVE
Spec Grav, UA: 1.015 (ref 1.010–1.025)
Urobilinogen, UA: 0.2 E.U./dL
pH, UA: 7.5 (ref 5.0–8.0)

## 2017-10-18 LAB — HEPATIC FUNCTION PANEL
ALT: 14 U/L (ref 0–35)
AST: 18 U/L (ref 0–37)
Albumin: 4 g/dL (ref 3.5–5.2)
Alkaline Phosphatase: 74 U/L (ref 39–117)
Bilirubin, Direct: 0.1 mg/dL (ref 0.0–0.3)
Total Bilirubin: 0.3 mg/dL (ref 0.2–1.2)
Total Protein: 6.8 g/dL (ref 6.0–8.3)

## 2017-10-18 LAB — T3, FREE: T3, Free: 3.5 pg/mL (ref 2.3–4.2)

## 2017-10-18 LAB — TSH: TSH: 0.5 u[IU]/mL (ref 0.35–4.50)

## 2017-10-18 LAB — T4, FREE: Free T4: 0.84 ng/dL (ref 0.60–1.60)

## 2017-10-18 NOTE — Patient Instructions (Addendum)
I agree that your swelling is probably hymphedema that you have had since a young age  And no evidence of heart or organ failure. .  Still consider  Compression during the day .   Rechecking thryoid liver etc to be sure nothing else treatable is going on .   If all ok then yearly  check as planned.

## 2017-10-18 NOTE — Progress Notes (Signed)
Chief Complaint  Patient presents with  . Follow-up    review u/s legs     HPI: [name] 84 y.o. come in forcocnern about swelling in legs   Saw dr Ace Gins for leg and hip opain and was noted to have edema( chornic)   Had  venous US neg dvt of parts seen and patient   requested what follow up.    See last visit   Went to dr Ace Gins cause she does   Stem cell  For joints  . In past  Saw dr Rushie Nyhan for left hip pain has been problematic   And not want to do surgery cause of age and risk .  Wants to get back to dr B about evaluation   Pt satses has had edema in feet ankles since her 30s and hasnt really chnages ith differnet meds  And  Has no change  In meds or other helps   No cp sob  Left hip pain the mores problematic   doesnt want to go to sleep apnea eval.  But has been told she stops breathing in sleep  Lives alone walks with cane but planning to move  Locally to  Better place with walk in shower  To help with ADLS says memory good enough and self and independent  Dose own bills  Etc .  ROS: See pertinent positives and negatives per HPI.  Past Medical History:  Diagnosis Date  . Allergy   . Anemia 2011   Since last visit she has had a Gi evaluation for newer onset Iron deficiiency anemia that was felt secondary to avm in duodenum rx with laser ablation.  . Arthritis    in back  . Asthma   . Cancer ([place])    rt breast  . GERD (gastroesophageal reflux disease)   . Hyperlipidemia   . Hypertension     Family History  Problem Relation Age of Onset  . Cancer Mother        breast and ovarian    Social History   Socioeconomic History  . Marital status: Widowed    Spouse name: Not on file  . Number of children: Not on file  . Years of education: Not on file  . Highest education level: Not on file  Occupational History  . Not on file  Social Needs  . Financial resource strain: Not on file  . Food insecurity:    Worry: Not on file    Inability: Not on file    . Transportation needs:    Medical: Not on file    Non-medical: Not on file  Tobacco Use  . Smoking status: Former Smoker    Packs/day: 0.25    Years: 4.00    Pack years: 1.00    Types: Cigarettes    Last attempt to quit: 03/05/1968    Years since quitting: 49.6  . Smokeless tobacco: Never Used  Substance and Sexual Activity  . Alcohol use: No  . Drug use: No  . Sexual activity: Not on file  Lifestyle  . Physical activity:    Days per week: Not on file    Minutes per session: Not on file  . Stress: Not on file  Relationships  . Social connections:    Talks on phone: Not on file    Gets together: Not on file    Attends religious service: Not on file    Active member of club or organization: Not on file  Attends meetings of clubs or organizations: Not on file    Relationship status: Not on file  Other Topics Concern  . Not on file  Social History Narrative   Widowed 2001    Ex smoker   HHof 1    Active in church and volunteer activities    [place] a few times per week   Swimming 2 x per week.    Tobacco 49=58 less than 1ppd.              Outpatient Medications Prior to Visit  Medication Sig Dispense Refill  . amLODipine (NORVASC) 10 MG tablet TAKE ONE TABLET BY MOUTH DAILY 90 tablet 1  . budesonide-formoterol (SYMBICORT) 160-4.5 MCG/ACT inhaler Inhale 2 puffs into the lungs 2 (two) times daily as needed (Shortness of breath).     . cycloSPORINE (RESTASIS) 0.05 % ophthalmic emulsion Place 1 drop into both eyes 2 (two) times daily.    Marland Kitchen EPIPEN 2-PAK 0.3 MG/0.3ML SOAJ injection INJ UTD PRN  1  . ferrous sulfate 325 (65 FE) MG tablet Take 325 mg by mouth daily with breakfast.    . fexofenadine (ALLEGRA) 180 MG tablet Take 180 mg by mouth.    Marland Kitchen KLOR-CON M20 20 MEQ tablet TAKE 1 TABLET TWICE A DAY 180 tablet 0  . lisinopril-hydrochlorothiazide (PRINZIDE,ZESTORETIC) 20-12.5 MG tablet TAKE ONE TABLET BY MOUTH TWO TIMES A DAY 180 tablet 0  . Magnesium 500 MG CAPS Take 500  mg by mouth daily.    . montelukast (SINGULAIR) 10 MG tablet Take 10 mg by mouth at bedtime.    Marland Kitchen omeprazole (PRILOSEC) 20 MG capsule Take 1 capsule (20 mg total) by mouth daily as needed (Acid Reflux). 90 capsule 2  . simvastatin (ZOCOR) 20 MG tablet Take 1 tablet (20 mg total) by mouth at bedtime. 90 tablet 3  . solifenacin (VESICARE) 5 MG tablet Take 1 tablet (5 mg total) by mouth daily. 90 tablet 3   No facility-administered medications prior to visit.      EXAM:  BP (!) 142/70 (BP Location: Right Arm, Patient Position: Sitting, Cuff Size: Normal)   Pulse 78   Wt 140 lb 12.8 oz (63.9 kg)   BMI 28.44 kg/m   Body mass index is 28.44 kg/m.  GENERAL: vitals reviewed and listed above, alert, oriented, appears well hydrated and in no acute distress HEENT: atraumatic, conjunctiva  clear, no obvious abnormalities on inspection of external nose and ears  NECK: no obvious masses on inspection palpation  LUNGS: clear to auscultation bilaterally, no wheezes, rales or rhonchi, good air movement CV: HRRR short sem lusb no radiation o clubbing cyanosis  nl cap refill  Both feet  Above ankles down down with edema no lesions    Walks antalgic  Cane hip left problematic   PSYCH: pleasant and cooperative, no obvious depression or anxiety Lab Results  Component Value Date   WBC 7.5 04/30/2017   HGB 13.4 04/30/2017   HCT 41.0 04/30/2017   PLT 378.0 04/30/2017   GLUCOSE 63 (L) 10/18/2017   CHOL 260 (H) 04/30/2017   TRIG 127.0 04/30/2017   HDL 72.00 04/30/2017   LDLCALC 163 (H) 04/30/2017   ALT 14 10/18/2017   AST 18 10/18/2017   NA 142 10/18/2017   K 3.7 10/18/2017   CL 103 10/18/2017   CREATININE 0.68 10/18/2017   BUN 15 10/18/2017   CO2 32 10/18/2017   TSH 0.50 10/18/2017   HGBA1C 5.6 04/30/2017   BP Readings from Last 3  Encounters:  10/18/17 (!) 142/70  05/03/17 130/60  04/30/17 110/82  .last  ASSESSMENT AND PLAN:  Discussed the following assessment and plan:  Edema,  unspecified type - ongoing years pt doesnt think from meds hx of vascular consult in past  compression st hard to get on disc strategies  - Plan: TSH, T4, free, T3, free, POCT Urinalysis Dipstick (Automated), Basic metabolic panel, Hepatic function panel  Subclinical hyperthyroidism - no fu  will repeat today follow u[p reluctant  to do fu eval.  - Plan: TSH, T4, free, T3, free, POCT Urinalysis Dipstick (Automated), Basic metabolic panel, Hepatic function panel  Pain of left hip joint - most bothersome now to her quality of life  -Patient advised to return or notify health care team  if  new concerns arise.  Patient Instructions  I agree that your swelling is probably hymphedema that you have had since a young age  And no evidence of heart or organ failure. .  Still consider  Compression during the day .   Rechecking thryoid liver etc to be sure nothing else treatable is going on .   If all ok then yearly  check as planned.    Standley Brooking. Agastya Meister M.D.

## 2017-10-21 ENCOUNTER — Telehealth: Payer: Self-pay

## 2017-10-21 DIAGNOSIS — M25476 Effusion, unspecified foot: Principal | ICD-10-CM

## 2017-10-21 DIAGNOSIS — M25473 Effusion, unspecified ankle: Principal | ICD-10-CM

## 2017-10-21 DIAGNOSIS — M25475 Effusion, left foot: Secondary | ICD-10-CM

## 2017-10-21 DIAGNOSIS — M25472 Effusion, left ankle: Secondary | ICD-10-CM

## 2017-10-21 NOTE — Telephone Encounter (Signed)
Dr. Regis Bill,  Please advise on making the referral per the pts request.  Thanks

## 2017-10-21 NOTE — Telephone Encounter (Signed)
Copied from [place] [phone]. Topic: Referral - Request >> Oct 21, 2017  1:20 PM Lennox Solders wrote: Reason for CRM: pt saw dr Regis Bill on 10-18-17 and would like a referral to vascular and vein  specialist on [address]. Pt would like the refer for swelling in her legs/ankles

## 2017-10-22 NOTE — Telephone Encounter (Signed)
Ok to doreferral as requested .  alseo see result note lab.

## 2017-10-23 DIAGNOSIS — J3089 Other allergic rhinitis: Secondary | ICD-10-CM | POA: Diagnosis not present

## 2017-10-23 DIAGNOSIS — J301 Allergic rhinitis due to pollen: Secondary | ICD-10-CM | POA: Diagnosis not present

## 2017-10-24 NOTE — Telephone Encounter (Signed)
Referral placed as requested.  Pt saw the Vein Specialist on 10/23/17  Nothing further needed.

## 2017-10-25 ENCOUNTER — Other Ambulatory Visit: Payer: Self-pay | Admitting: Internal Medicine

## 2017-10-30 DIAGNOSIS — J3089 Other allergic rhinitis: Secondary | ICD-10-CM | POA: Diagnosis not present

## 2017-10-30 DIAGNOSIS — J301 Allergic rhinitis due to pollen: Secondary | ICD-10-CM | POA: Diagnosis not present

## 2017-11-07 DIAGNOSIS — J3089 Other allergic rhinitis: Secondary | ICD-10-CM | POA: Diagnosis not present

## 2017-11-07 DIAGNOSIS — J301 Allergic rhinitis due to pollen: Secondary | ICD-10-CM | POA: Diagnosis not present

## 2017-11-11 ENCOUNTER — Telehealth: Payer: Self-pay | Admitting: Internal Medicine

## 2017-11-11 DIAGNOSIS — J301 Allergic rhinitis due to pollen: Secondary | ICD-10-CM | POA: Diagnosis not present

## 2017-11-11 DIAGNOSIS — J3089 Other allergic rhinitis: Secondary | ICD-10-CM | POA: Diagnosis not present

## 2017-11-11 DIAGNOSIS — J3081 Allergic rhinitis due to animal (cat) (dog) hair and dander: Secondary | ICD-10-CM | POA: Diagnosis not present

## 2017-11-11 NOTE — Telephone Encounter (Signed)
Copied from [place] [id number]. Topic: Quick Communication - See Telephone Encounter >> Nov 11, 2017  1:03 PM Blase Mess A wrote: CRM for notification. See Telephone encounter for: 11/11/17.

## 2017-11-12 NOTE — Telephone Encounter (Deleted)
Copied from [place] [id number]. Topic: Inquiry >> Nov 12, 2017  9:34 AM Berneta Levins wrote: Reason for CRM:   Tye Maryland from [facility] calling to see if a fax was received from them on this pt in regards to a cancer screen. Tye Maryland can be reached at [phone]

## 2017-11-12 NOTE — Telephone Encounter (Signed)
Copied from [place] [id number]. Topic: Inquiry >> Nov 12, 2017  9:34 AM Berneta Levins wrote: Reason for CRM:   Tye Maryland from [facility] calling to see if a fax was received from them on this pt in regards to a cancer screen. Tye Maryland can be reached at [phone]

## 2017-11-12 NOTE — Telephone Encounter (Signed)
Called [facility] back, Spoke with Wells Guiles, made aware that Cancer Screening for was received.  Placed in Dr Regis Bill red folder.

## 2017-11-13 NOTE — Telephone Encounter (Signed)
I do  not order genetic testing labs  So not signing this .  But I refer  To genetic counselors  who give me  A report and  Order  appropriate testing with counseling  For the patient .   Contact the patient and tell her that we can do a referral to genetic counseling at the cancer center  Here   Or other referral  Of her choice . For cancer risk testing.

## 2017-11-14 NOTE — Telephone Encounter (Signed)
Thanks  You for checking

## 2017-11-14 NOTE — Telephone Encounter (Signed)
Pt states that she has not signed up for anything regarding genetic testing. Pt states that she has never heard of this location and is not interested in genetic testing. Faxed back "DENIED" Form to be shredded.  Will send to Dr Regis Bill as Juluis Rainier.

## 2017-11-18 DIAGNOSIS — J301 Allergic rhinitis due to pollen: Secondary | ICD-10-CM | POA: Diagnosis not present

## 2017-11-18 DIAGNOSIS — J3089 Other allergic rhinitis: Secondary | ICD-10-CM | POA: Diagnosis not present

## 2017-11-21 DIAGNOSIS — Z23 Encounter for immunization: Secondary | ICD-10-CM | POA: Diagnosis not present

## 2017-11-22 DIAGNOSIS — J3081 Allergic rhinitis due to animal (cat) (dog) hair and dander: Secondary | ICD-10-CM | POA: Diagnosis not present

## 2017-11-22 DIAGNOSIS — J3089 Other allergic rhinitis: Secondary | ICD-10-CM | POA: Diagnosis not present

## 2017-11-22 DIAGNOSIS — J4530 Mild persistent asthma, uncomplicated: Secondary | ICD-10-CM | POA: Diagnosis not present

## 2017-11-22 DIAGNOSIS — J301 Allergic rhinitis due to pollen: Secondary | ICD-10-CM | POA: Diagnosis not present

## 2017-11-27 ENCOUNTER — Other Ambulatory Visit: Payer: Self-pay | Admitting: Internal Medicine

## 2017-11-28 DIAGNOSIS — J3081 Allergic rhinitis due to animal (cat) (dog) hair and dander: Secondary | ICD-10-CM | POA: Diagnosis not present

## 2017-11-28 DIAGNOSIS — J301 Allergic rhinitis due to pollen: Secondary | ICD-10-CM | POA: Diagnosis not present

## 2017-11-28 DIAGNOSIS — J3089 Other allergic rhinitis: Secondary | ICD-10-CM | POA: Diagnosis not present

## 2017-12-04 DIAGNOSIS — J301 Allergic rhinitis due to pollen: Secondary | ICD-10-CM | POA: Diagnosis not present

## 2017-12-04 DIAGNOSIS — J3089 Other allergic rhinitis: Secondary | ICD-10-CM | POA: Diagnosis not present

## 2017-12-09 DIAGNOSIS — M47816 Spondylosis without myelopathy or radiculopathy, lumbar region: Secondary | ICD-10-CM | POA: Diagnosis not present

## 2017-12-09 DIAGNOSIS — M5136 Other intervertebral disc degeneration, lumbar region: Secondary | ICD-10-CM | POA: Diagnosis not present

## 2017-12-17 DIAGNOSIS — J3089 Other allergic rhinitis: Secondary | ICD-10-CM | POA: Diagnosis not present

## 2017-12-17 DIAGNOSIS — J301 Allergic rhinitis due to pollen: Secondary | ICD-10-CM | POA: Diagnosis not present

## 2017-12-23 ENCOUNTER — Encounter (HOSPITAL_COMMUNITY): Payer: Self-pay | Admitting: Emergency Medicine

## 2017-12-23 ENCOUNTER — Emergency Department (HOSPITAL_COMMUNITY): Payer: Medicare Other

## 2017-12-23 ENCOUNTER — Other Ambulatory Visit: Payer: Self-pay

## 2017-12-23 ENCOUNTER — Inpatient Hospital Stay (HOSPITAL_COMMUNITY)
Admission: EM | Admit: 2017-12-23 | Discharge: 2017-12-26 | DRG: 558 | Disposition: A | Discharge status: Home Health | Payer: Medicare Other | Attending: Internal Medicine | Admitting: Internal Medicine

## 2017-12-23 DIAGNOSIS — E785 Hyperlipidemia, unspecified: Secondary | ICD-10-CM | POA: Diagnosis present

## 2017-12-23 DIAGNOSIS — J45909 Unspecified asthma, uncomplicated: Secondary | ICD-10-CM | POA: Diagnosis present

## 2017-12-23 DIAGNOSIS — M6282 Rhabdomyolysis: Principal | ICD-10-CM | POA: Diagnosis present

## 2017-12-23 DIAGNOSIS — Z79899 Other long term (current) drug therapy: Secondary | ICD-10-CM | POA: Diagnosis not present

## 2017-12-23 DIAGNOSIS — W19XXXA Unspecified fall, initial encounter: Secondary | ICD-10-CM | POA: Diagnosis not present

## 2017-12-23 DIAGNOSIS — T796XXA Traumatic ischemia of muscle, initial encounter: Secondary | ICD-10-CM

## 2017-12-23 DIAGNOSIS — I5030 Unspecified diastolic (congestive) heart failure: Secondary | ICD-10-CM | POA: Diagnosis not present

## 2017-12-23 DIAGNOSIS — R404 Transient alteration of awareness: Secondary | ICD-10-CM | POA: Diagnosis not present

## 2017-12-23 DIAGNOSIS — D72829 Elevated white blood cell count, unspecified: Secondary | ICD-10-CM | POA: Diagnosis present

## 2017-12-23 DIAGNOSIS — R339 Retention of urine, unspecified: Secondary | ICD-10-CM | POA: Diagnosis present

## 2017-12-23 DIAGNOSIS — I110 Hypertensive heart disease with heart failure: Secondary | ICD-10-CM | POA: Diagnosis present

## 2017-12-23 DIAGNOSIS — R748 Abnormal levels of other serum enzymes: Secondary | ICD-10-CM

## 2017-12-23 DIAGNOSIS — E876 Hypokalemia: Secondary | ICD-10-CM | POA: Diagnosis present

## 2017-12-23 DIAGNOSIS — K219 Gastro-esophageal reflux disease without esophagitis: Secondary | ICD-10-CM | POA: Diagnosis not present

## 2017-12-23 DIAGNOSIS — E860 Dehydration: Secondary | ICD-10-CM | POA: Diagnosis not present

## 2017-12-23 DIAGNOSIS — D509 Iron deficiency anemia, unspecified: Secondary | ICD-10-CM | POA: Diagnosis present

## 2017-12-23 DIAGNOSIS — S199XXA Unspecified injury of neck, initial encounter: Secondary | ICD-10-CM | POA: Diagnosis not present

## 2017-12-23 DIAGNOSIS — R51 Headache: Secondary | ICD-10-CM | POA: Diagnosis not present

## 2017-12-23 DIAGNOSIS — Y92009 Unspecified place in unspecified non-institutional (private) residence as the place of occurrence of the external cause: Secondary | ICD-10-CM | POA: Diagnosis not present

## 2017-12-23 DIAGNOSIS — R55 Syncope and collapse: Secondary | ICD-10-CM | POA: Diagnosis not present

## 2017-12-23 DIAGNOSIS — N179 Acute kidney failure, unspecified: Secondary | ICD-10-CM | POA: Diagnosis present

## 2017-12-23 DIAGNOSIS — M542 Cervicalgia: Secondary | ICD-10-CM | POA: Diagnosis not present

## 2017-12-23 DIAGNOSIS — R531 Weakness: Secondary | ICD-10-CM

## 2017-12-23 DIAGNOSIS — D72823 Leukemoid reaction: Secondary | ICD-10-CM | POA: Diagnosis not present

## 2017-12-23 DIAGNOSIS — Z87891 Personal history of nicotine dependence: Secondary | ICD-10-CM

## 2017-12-23 DIAGNOSIS — I10 Essential (primary) hypertension: Secondary | ICD-10-CM | POA: Diagnosis not present

## 2017-12-23 DIAGNOSIS — Z853 Personal history of malignant neoplasm of breast: Secondary | ICD-10-CM

## 2017-12-23 DIAGNOSIS — W010XXA Fall on same level from slipping, tripping and stumbling without subsequent striking against object, initial encounter: Secondary | ICD-10-CM

## 2017-12-23 DIAGNOSIS — R410 Disorientation, unspecified: Secondary | ICD-10-CM | POA: Diagnosis not present

## 2017-12-23 DIAGNOSIS — I5032 Chronic diastolic (congestive) heart failure: Secondary | ICD-10-CM | POA: Diagnosis not present

## 2017-12-23 DIAGNOSIS — C50919 Malignant neoplasm of unspecified site of unspecified female breast: Secondary | ICD-10-CM | POA: Diagnosis present

## 2017-12-23 DIAGNOSIS — S0990XA Unspecified injury of head, initial encounter: Secondary | ICD-10-CM | POA: Diagnosis not present

## 2017-12-23 DIAGNOSIS — S7002XA Contusion of left hip, initial encounter: Secondary | ICD-10-CM | POA: Diagnosis not present

## 2017-12-23 DIAGNOSIS — S40011A Contusion of right shoulder, initial encounter: Secondary | ICD-10-CM | POA: Diagnosis not present

## 2017-12-23 LAB — BASIC METABOLIC PANEL
Anion gap: 14 (ref 5–15)
BUN: 26 mg/dL — ABNORMAL HIGH (ref 8–23)
CO2: 20 mmol/L — ABNORMAL LOW (ref 22–32)
Calcium: 8.8 mg/dL — ABNORMAL LOW (ref 8.9–10.3)
Chloride: 104 mmol/L (ref 98–111)
Creatinine, Ser: 0.88 mg/dL (ref 0.44–1.00)
GFR calc Af Amer: >60 mL/min (ref >60)
GFR calc non Af Amer: 58 mL/min — ABNORMAL LOW (ref >60)
Glucose, Bld: 106 mg/dL — ABNORMAL HIGH (ref 70–99)
Potassium: 5.1 mmol/L (ref 3.5–5.1)
Sodium: 138 mmol/L (ref 135–145)

## 2017-12-23 LAB — CBC WITH DIFFERENTIAL/PLATELET
Abs Immature Granulocytes: 0.12 10*3/uL — ABNORMAL HIGH (ref 0.00–0.07)
Basophils Absolute: 0 10*3/uL (ref 0.0–0.1)
Basophils Relative: 0 %
Eosinophils Absolute: 0 10*3/uL (ref 0.0–0.5)
Eosinophils Relative: 0 %
HCT: 38.9 % (ref 36.0–46.0)
Hemoglobin: 11.9 g/dL — ABNORMAL LOW (ref 12.0–15.0)
Immature Granulocytes: 1 %
Lymphocytes Relative: 6 %
Lymphs Abs: 1.1 10*3/uL (ref 0.7–4.0)
MCH: 24.8 pg — ABNORMAL LOW (ref 26.0–34.0)
MCHC: 30.6 g/dL (ref 30.0–36.0)
MCV: 81 fL (ref 80.0–100.0)
Monocytes Absolute: 1.2 10*3/uL — ABNORMAL HIGH (ref 0.1–1.0)
Monocytes Relative: 7 %
Neutro Abs: 15.3 10*3/uL — ABNORMAL HIGH (ref 1.7–7.7)
Neutrophils Relative %: 86 %
Platelets: 355 10*3/uL (ref 150–400)
RBC: 4.8 MIL/uL (ref 3.87–5.11)
RDW: 15.9 % — ABNORMAL HIGH (ref 11.5–15.5)
WBC: 17.8 10*3/uL — ABNORMAL HIGH (ref 4.0–10.5)
nRBC: 0 % (ref 0.0–0.2)

## 2017-12-23 LAB — URINALYSIS, ROUTINE W REFLEX MICROSCOPIC
Bacteria, UA: NONE SEEN
Bilirubin Urine: NEGATIVE
Glucose, UA: NEGATIVE mg/dL
Hgb urine dipstick: NEGATIVE
Ketones, ur: 80 mg/dL — AB
Leukocytes, UA: NEGATIVE
Nitrite: NEGATIVE
Protein, ur: 30 mg/dL — AB
Specific Gravity, Urine: 1.019 (ref 1.005–1.030)
pH: 7 (ref 5.0–8.0)

## 2017-12-23 LAB — CK: Total CK: 1886 U/L — ABNORMAL HIGH (ref 38–234)

## 2017-12-23 MED ORDER — SODIUM CHLORIDE 0.9 % IV BOLUS
1000.0000 mL | Freq: Once | INTRAVENOUS | Status: AC
  Administered 2017-12-23: 1000 mL via INTRAVENOUS

## 2017-12-23 MED ORDER — AMLODIPINE BESYLATE 10 MG PO TABS
10.0000 mg | ORAL_TABLET | Freq: Every day | ORAL | Status: DC
  Administered 2017-12-25 – 2017-12-26 (×2): 10 mg via ORAL
  Filled 2017-12-23 (×3): qty 1

## 2017-12-23 MED ORDER — DOCUSATE SODIUM 100 MG PO CAPS
100.0000 mg | ORAL_CAPSULE | Freq: Two times a day (BID) | ORAL | Status: DC
  Administered 2017-12-24 – 2017-12-26 (×5): 100 mg via ORAL
  Filled 2017-12-23 (×6): qty 1

## 2017-12-23 MED ORDER — ONDANSETRON HCL 4 MG PO TABS: 4.0000 mg | ORAL_TABLET | Freq: Four times a day (QID) | ORAL | Status: DC | PRN

## 2017-12-23 MED ORDER — FERROUS SULFATE 325 (65 FE) MG PO TABS
325.0000 mg | ORAL_TABLET | Freq: Every day | ORAL | Status: DC
  Administered 2017-12-24 – 2017-12-26 (×3): 325 mg via ORAL
  Filled 2017-12-23 (×3): qty 1

## 2017-12-23 MED ORDER — MAGNESIUM OXIDE 400 (241.3 MG) MG PO TABS
400.0000 mg | ORAL_TABLET | Freq: Every day | ORAL | Status: DC
  Administered 2017-12-24 – 2017-12-26 (×2): 400 mg via ORAL
  Filled 2017-12-23 (×3): qty 1

## 2017-12-23 MED ORDER — CYCLOSPORINE 0.05 % OP EMUL
1.0000 [drp] | Freq: Two times a day (BID) | OPHTHALMIC | Status: DC
  Administered 2017-12-24 – 2017-12-26 (×4): 1 [drp] via OPHTHALMIC
  Filled 2017-12-23 (×5): qty 1

## 2017-12-23 MED ORDER — DARIFENACIN HYDROBROMIDE ER 7.5 MG PO TB24
7.5000 mg | ORAL_TABLET | Freq: Every day | ORAL | Status: DC
  Administered 2017-12-24 – 2017-12-26 (×3): 7.5 mg via ORAL
  Filled 2017-12-23 (×3): qty 1

## 2017-12-23 MED ORDER — SIMVASTATIN 10 MG PO TABS
20.0000 mg | ORAL_TABLET | Freq: Every day | ORAL | Status: DC
  Administered 2017-12-24 – 2017-12-25 (×3): 20 mg via ORAL
  Filled 2017-12-23 (×3): qty 2

## 2017-12-23 MED ORDER — LISINOPRIL-HYDROCHLOROTHIAZIDE 20-12.5 MG PO TABS: 1.0000 | ORAL_TABLET | Freq: Two times a day (BID) | ORAL | Status: DC

## 2017-12-23 MED ORDER — SODIUM CHLORIDE 0.9 % IV BOLUS
500.0000 mL | Freq: Once | INTRAVENOUS | Status: AC
  Administered 2017-12-23: 500 mL via INTRAVENOUS

## 2017-12-23 MED ORDER — MOMETASONE FURO-FORMOTEROL FUM 200-5 MCG/ACT IN AERO
2.0000 | INHALATION_SPRAY | Freq: Two times a day (BID) | RESPIRATORY_TRACT | Status: DC
  Administered 2017-12-24 – 2017-12-26 (×4): 2 via RESPIRATORY_TRACT
  Filled 2017-12-23: qty 8.8

## 2017-12-23 MED ORDER — MONTELUKAST SODIUM 10 MG PO TABS
10.0000 mg | ORAL_TABLET | Freq: Every day | ORAL | Status: DC
  Administered 2017-12-24 – 2017-12-25 (×3): 10 mg via ORAL
  Filled 2017-12-23 (×3): qty 1

## 2017-12-23 MED ORDER — POTASSIUM CHLORIDE CRYS ER 20 MEQ PO TBCR
20.0000 meq | EXTENDED_RELEASE_TABLET | Freq: Two times a day (BID) | ORAL | Status: DC
  Administered 2017-12-24 – 2017-12-26 (×6): 20 meq via ORAL
  Filled 2017-12-23 (×6): qty 1

## 2017-12-23 MED ORDER — LORATADINE 10 MG PO TABS
10.0000 mg | ORAL_TABLET | Freq: Every day | ORAL | Status: DC
  Administered 2017-12-24 – 2017-12-26 (×3): 10 mg via ORAL
  Filled 2017-12-23 (×3): qty 1

## 2017-12-23 MED ORDER — SODIUM CHLORIDE 0.9 % IV SOLN
INTRAVENOUS | Status: DC
  Administered 2017-12-24 – 2017-12-25 (×4): via INTRAVENOUS

## 2017-12-23 MED ORDER — PANTOPRAZOLE SODIUM 40 MG PO TBEC
40.0000 mg | DELAYED_RELEASE_TABLET | Freq: Every day | ORAL | Status: DC
  Administered 2017-12-24 – 2017-12-26 (×3): 40 mg via ORAL
  Filled 2017-12-23 (×3): qty 1

## 2017-12-23 MED ORDER — ENOXAPARIN SODIUM 40 MG/0.4ML ~~LOC~~ SOLN
40.0000 mg | Freq: Every day | SUBCUTANEOUS | Status: DC
  Administered 2017-12-24 – 2017-12-25 (×3): 40 mg via SUBCUTANEOUS
  Filled 2017-12-23 (×3): qty 0.4

## 2017-12-23 MED ORDER — ONDANSETRON HCL 4 MG/2ML IJ SOLN: 4.0000 mg | Freq: Four times a day (QID) | INTRAMUSCULAR | Status: DC | PRN

## 2017-12-23 NOTE — ED Provider Notes (Signed)
[facility] DEPT Provider Note   CSN: [id number] Arrival date & time: 12/23/17  1918     History   Chief Complaint Chief Complaint  Patient presents with  . Fall    HPI [name] is a 84 y.o. female.  Patient presents from home s/p fall at some point since 17 yesterday. At baseline, lives independently - family last talked to yesterday at 20 pm. Today pt would not answer phone, family went to check on her, and found her near back door, on floor. Pt indicated to them she fell out of bed, but family states bed is in the other room. Pt very poor historian - level 5 caveat - pt unsure how long on floor. Pt with contusion/bruising to right shoulder. Pt denies other specific pain or injury.   The history is provided by the patient, a relative and the EMS personnel. The history is limited by the condition of the patient.  Fall  Pertinent negatives include no chest pain, no abdominal pain, no headaches and no shortness of breath.    Past Medical History:  Diagnosis Date  . Allergy   . Anemia 2011   Since last visit she has had a Gi evaluation for newer onset Iron deficiiency anemia that was felt secondary to avm in duodenum rx with laser ablation.  . Arthritis    in back  . Asthma   . Cancer ([place])    rt breast  . GERD (gastroesophageal reflux disease)   . Hyperlipidemia   . Hypertension     Patient Active Problem List   Diagnosis Date Noted  . Microscopic hematuria 09/27/2014  . HX: breast cancer 08/25/2014  . AVM (arteriovenous malformation) of colon 09/14/2013  . Chest tightness 01/24/2012  . Subclinical hyperthyroidism 09/15/2011  . Visit for preventive health examination 04/28/2011  . Hypokalemia 04/24/2011  . Abnormal thyroid stimulating hormone level 04/24/2011  . Gallstones 05/17/2010  . Fatty infiltration of liver 05/17/2010  . CARCINOMA, BREAST 03/17/2010  . EDEMA 11/29/2009  . ANGIODYSPLASIA OF INTESTINE WITH  HEMORRHAGE 04/07/2009  . DIVERTICULOSIS OF COLON 08/27/2008  . ANEMIA, IRON DEFICIENCY 05/24/2008  . VITAMIN D DEFICIENCY 04/13/2008  . OVERACTIVE BLADDER 03/26/2008  . OSTEOPENIA 03/26/2008  . DYSPNEA ON EXERTION 03/26/2008  . HYPERLIPIDEMIA 12/06/2006  . HYPERTENSION 12/06/2006  . ALLERGIC RHINITIS 12/06/2006  . ASTHMA 12/06/2006  . GERD 12/06/2006  . OSTEOARTHRITIS 12/06/2006    Past Surgical History:  Procedure Laterality Date  . BREAST LUMPECTOMY  04/26/2010   right  . COLONOSCOPY WITH PROPOFOL N/A 09/14/2013   Procedure: COLONOSCOPY WITH PROPOFOL;  Surgeon: Inda Castle, MD;  Location: [facility];  Service: Endoscopy;  Laterality: N/A;  . HOT HEMOSTASIS N/A 09/14/2013   Procedure: HOT HEMOSTASIS (ARGON PLASMA COAGULATION/BICAP);  Surgeon: Inda Castle, MD;  Location: [facility];  Service: Endoscopy;  Laterality: N/A;  . LARYNGECTOMY  20-25 yrs ago  . PARTIAL HIP ARTHROPLASTY     rt     OB History   None      Home Medications    Prior to Admission medications   Medication Sig Start Date End Date Taking? Authorizing Provider  amLODipine (NORVASC) 10 MG tablet TAKE ONE TABLET BY MOUTH DAILY 11/27/17   Panosh, Standley Brooking, MD  budesonide-formoterol (SYMBICORT) 160-4.5 MCG/ACT inhaler Inhale 2 puffs into the lungs 2 (two) times daily as needed (Shortness of breath).     [provider]  cycloSPORINE (RESTASIS) 0.05 % ophthalmic emulsion Place 1 drop  into both eyes 2 (two) times daily.    [provider]  EPIPEN 2-PAK 0.3 MG/0.3ML SOAJ injection Inject 0.3 mg into the muscle once.  08/18/14   [provider]  ferrous sulfate 325 (65 FE) MG tablet Take 325 mg by mouth daily with breakfast.    [provider]  fexofenadine (ALLEGRA) 180 MG tablet Take 180 mg by mouth.    [provider]  KLOR-CON M20 20 MEQ tablet TAKE 1 TABLET TWICE A DAY 05/09/15   Panosh, Standley Brooking, MD  lisinopril-hydrochlorothiazide (PRINZIDE,ZESTORETIC) 20-12.5  MG tablet TAKE ONE TABLET BY MOUTH TWO TIMES A DAY 10/25/17   Panosh, Standley Brooking, MD  Magnesium 500 MG CAPS Take 500 mg by mouth daily.    [provider]  montelukast (SINGULAIR) 10 MG tablet Take 10 mg by mouth at bedtime.    [provider]  omeprazole (PRILOSEC) 20 MG capsule Take 1 capsule (20 mg total) by mouth daily as needed (Acid Reflux). 11/02/14   Panosh, Standley Brooking, MD  simvastatin (ZOCOR) 20 MG tablet Take 1 tablet (20 mg total) by mouth at bedtime. 06/12/13   Panosh, Standley Brooking, MD  solifenacin (VESICARE) 5 MG tablet Take 1 tablet (5 mg total) by mouth daily. 06/12/13   Panosh, Standley Brooking, MD    Family History Family History  Problem Relation Age of Onset  . Cancer Mother        breast and ovarian    Social History Social History   Tobacco Use  . Smoking status: Former Smoker    Packs/day: 0.25    Years: 4.00    Pack years: 1.00    Types: Cigarettes    Last attempt to quit: 03/05/1968    Years since quitting: 49.8  . Smokeless tobacco: Never Used  Substance Use Topics  . Alcohol use: No  . Drug use: No     Allergies   Patient has no known allergies.   Review of Systems Review of Systems  Constitutional: Negative for fever.  HENT: Negative for sore throat.   Eyes: Negative for redness.  Respiratory: Negative for cough and shortness of breath.   Cardiovascular: Negative for chest pain.  Gastrointestinal: Negative for abdominal pain.  Genitourinary: Negative for flank pain.  Musculoskeletal: Negative for back pain and neck pain.  Skin: Negative for wound.  Neurological: Negative for headaches.  Hematological: Does not bruise/bleed easily.  Psychiatric/Behavioral: Negative for confusion.     Physical Exam Updated Vital Signs BP (!) 146/61   Pulse 78   Temp 98.1 F (36.7 C) (Oral)   Resp 19   SpO2 97%   Physical Exam  Constitutional: She appears well-developed and well-nourished.  HENT:  Tenderness posterior scalp ?contusion.   Eyes: Pupils  are equal, round, and reactive to light. Conjunctivae are normal. No scleral icterus.  Neck: Neck supple. No tracheal deviation present.  Cardiovascular: Normal rate, regular rhythm, normal heart sounds and intact distal pulses. Exam reveals no gallop and no friction rub.  No murmur heard. Pulmonary/Chest: Effort normal and breath sounds normal. No respiratory distress. She exhibits no tenderness.  Abdominal: Soft. Normal appearance and bowel sounds are normal. She exhibits no distension and no mass. There is no tenderness. There is no rebound and no guarding. No hernia.  Genitourinary:  Genitourinary Comments: No cva tenderness  Musculoskeletal: She exhibits no edema.  Neurological: She is alert.  Motor/sens grossly intact bil. Speech very quiet but not grossly dysarthric.   Skin: Skin is warm and  dry. No rash noted.  Psychiatric: She has a normal mood and affect.  Nursing note and vitals reviewed.    ED Treatments / Results  Labs (all labs ordered are listed, but only abnormal results are displayed) Results for orders placed or performed during the hospital encounter of 43/15/40  Basic metabolic panel  Result Value Ref Range   Sodium 138 135 - 145 mmol/L   Potassium 5.1 3.5 - 5.1 mmol/L   Chloride 104 98 - 111 mmol/L   CO2 20 (L) 22 - 32 mmol/L   Glucose, Bld 106 (H) 70 - 99 mg/dL   BUN 26 (H) 8 - 23 mg/dL   Creatinine, Ser 0.88 0.44 - 1.00 mg/dL   Calcium 8.8 (L) 8.9 - 10.3 mg/dL   GFR calc non Af Amer 58 (L) >60 mL/min   GFR calc Af Amer >60 >60 mL/min   Anion gap 14 5 - 15  Urinalysis, Routine w reflex microscopic  Result Value Ref Range   Color, Urine YELLOW YELLOW   APPearance CLEAR CLEAR   Specific Gravity, Urine 1.019 1.005 - 1.030   pH 7.0 5.0 - 8.0   Glucose, UA NEGATIVE NEGATIVE mg/dL   Hgb urine dipstick NEGATIVE NEGATIVE   Bilirubin Urine NEGATIVE NEGATIVE   Ketones, ur 80 (A) NEGATIVE mg/dL   Protein, ur 30 (A) NEGATIVE mg/dL   Nitrite NEGATIVE NEGATIVE    Leukocytes, UA NEGATIVE NEGATIVE   RBC / HPF 0-5 0 - 5 RBC/hpf   WBC, UA 0-5 0 - 5 WBC/hpf   Bacteria, UA NONE SEEN NONE SEEN   Mucus PRESENT   CK  Result Value Ref Range   Total CK 1,886 (H) 38 - 234 U/L   Dg Shoulder Right  Result Date: 12/23/2017 CLINICAL DATA:  Fall, contusion EXAM: RIGHT SHOULDER - 2+ VIEW COMPARISON:  None. FINDINGS: Moderate AC joint degenerative change. Mild to moderate glenohumeral degenerative change. No fracture or dislocation. IMPRESSION: No acute osseous abnormality. Electronically Signed   By: Donavan Foil M.D.   On: 12/23/2017 21:10   Ct Head Wo Contrast  Result Date: 12/23/2017 CLINICAL DATA:  Found down.  Left side pain EXAM: CT HEAD WITHOUT CONTRAST CT CERVICAL SPINE WITHOUT CONTRAST TECHNIQUE: Multidetector CT imaging of the head and cervical spine was performed following the standard protocol without intravenous contrast. Multiplanar CT image reconstructions of the cervical spine were also generated. COMPARISON:  None FINDINGS: CT HEAD FINDINGS Brain: There is atrophy and chronic small vessel disease changes. No acute intracranial abnormality. Specifically, no hemorrhage, hydrocephalus, mass lesion, acute infarction, or significant intracranial injury. Vascular: No hyperdense vessel or unexpected calcification. Skull: No acute calvarial abnormality. Sinuses/Orbits: Mucosal thickening throughout the paranasal sinuses. Complete opacification of the right maxillary sinus. Other: None CT CERVICAL SPINE FINDINGS Alignment: No subluxation Skull base and vertebrae: No acute fracture. No primary bone lesion or focal pathologic process. Soft tissues and spinal canal: No prevertebral fluid or swelling. No visible canal hematoma. Disc levels: Diffuse degenerative disc disease with disc space narrowing and spurring. Diffuse bilateral degenerative facet disease. Upper chest: No acute findings Other: Bilateral carotid bulb calcifications. IMPRESSION: Atrophy, chronic small  vessel disease. Diffuse degenerative disc and facet disease throughout the cervical spine. No acute bony abnormality. Electronically Signed   By: Rolm Baptise M.D.   On: 12/23/2017 21:30   Ct Cervical Spine Wo Contrast  Result Date: 12/23/2017 CLINICAL DATA:  Found down.  Left side pain EXAM: CT HEAD WITHOUT CONTRAST CT CERVICAL SPINE WITHOUT CONTRAST TECHNIQUE:  Multidetector CT imaging of the head and cervical spine was performed following the standard protocol without intravenous contrast. Multiplanar CT image reconstructions of the cervical spine were also generated. COMPARISON:  None FINDINGS: CT HEAD FINDINGS Brain: There is atrophy and chronic small vessel disease changes. No acute intracranial abnormality. Specifically, no hemorrhage, hydrocephalus, mass lesion, acute infarction, or significant intracranial injury. Vascular: No hyperdense vessel or unexpected calcification. Skull: No acute calvarial abnormality. Sinuses/Orbits: Mucosal thickening throughout the paranasal sinuses. Complete opacification of the right maxillary sinus. Other: None CT CERVICAL SPINE FINDINGS Alignment: No subluxation Skull base and vertebrae: No acute fracture. No primary bone lesion or focal pathologic process. Soft tissues and spinal canal: No prevertebral fluid or swelling. No visible canal hematoma. Disc levels: Diffuse degenerative disc disease with disc space narrowing and spurring. Diffuse bilateral degenerative facet disease. Upper chest: No acute findings Other: Bilateral carotid bulb calcifications. IMPRESSION: Atrophy, chronic small vessel disease. Diffuse degenerative disc and facet disease throughout the cervical spine. No acute bony abnormality. Electronically Signed   By: Rolm Baptise M.D.   On: 12/23/2017 21:30   Dg Hip Unilat W Or W/o Pelvis 2-3 Views Left  Result Date: 12/23/2017 CLINICAL DATA:  Fall, contusion EXAM: DG HIP (WITH OR WITHOUT PELVIS) 2-3V LEFT COMPARISON:  None. FINDINGS: Pubic symphysis  and rami are intact. Prior right hip replacement with normal alignment. Severe arthritis of the left hip with obliteration of joint space, subarticular sclerosis and cyst formation. The left femoral neck is poorly evaluated due to bony summation. IMPRESSION: 1. Advanced arthritis of the left hip. Limited evaluation of left femoral neck due to bony superimposition. 2. Prior right hip replacement without acute abnormality. Electronically Signed   By: Donavan Foil M.D.   On: 12/23/2017 21:11    EKG EKG Interpretation  Date/Time:  Monday December 23 2017 19:41:31 EDT Ventricular Rate:  86 PR Interval:    QRS Duration: 108 QT Interval:  395 QTC Calculation: 473 R Axis:   20 Text Interpretation:  Sinus rhythm Nonspecific T wave abnormality No significant change since last tracing Confirmed by Lajean Saver [phone]) on 12/23/2017 8:33:25 PM   Radiology Dg Shoulder Right  Result Date: 12/23/2017 CLINICAL DATA:  Fall, contusion EXAM: RIGHT SHOULDER - 2+ VIEW COMPARISON:  None. FINDINGS: Moderate AC joint degenerative change. Mild to moderate glenohumeral degenerative change. No fracture or dislocation. IMPRESSION: No acute osseous abnormality. Electronically Signed   By: Donavan Foil M.D.   On: 12/23/2017 21:10   Ct Head Wo Contrast  Result Date: 12/23/2017 CLINICAL DATA:  Found down.  Left side pain EXAM: CT HEAD WITHOUT CONTRAST CT CERVICAL SPINE WITHOUT CONTRAST TECHNIQUE: Multidetector CT imaging of the head and cervical spine was performed following the standard protocol without intravenous contrast. Multiplanar CT image reconstructions of the cervical spine were also generated. COMPARISON:  None FINDINGS: CT HEAD FINDINGS Brain: There is atrophy and chronic small vessel disease changes. No acute intracranial abnormality. Specifically, no hemorrhage, hydrocephalus, mass lesion, acute infarction, or significant intracranial injury. Vascular: No hyperdense vessel or unexpected calcification.  Skull: No acute calvarial abnormality. Sinuses/Orbits: Mucosal thickening throughout the paranasal sinuses. Complete opacification of the right maxillary sinus. Other: None CT CERVICAL SPINE FINDINGS Alignment: No subluxation Skull base and vertebrae: No acute fracture. No primary bone lesion or focal pathologic process. Soft tissues and spinal canal: No prevertebral fluid or swelling. No visible canal hematoma. Disc levels: Diffuse degenerative disc disease with disc space narrowing and spurring. Diffuse bilateral degenerative facet disease. Upper chest:  No acute findings Other: Bilateral carotid bulb calcifications. IMPRESSION: Atrophy, chronic small vessel disease. Diffuse degenerative disc and facet disease throughout the cervical spine. No acute bony abnormality. Electronically Signed   By: Rolm Baptise M.D.   On: 12/23/2017 21:30   Ct Cervical Spine Wo Contrast  Result Date: 12/23/2017 CLINICAL DATA:  Found down.  Left side pain EXAM: CT HEAD WITHOUT CONTRAST CT CERVICAL SPINE WITHOUT CONTRAST TECHNIQUE: Multidetector CT imaging of the head and cervical spine was performed following the standard protocol without intravenous contrast. Multiplanar CT image reconstructions of the cervical spine were also generated. COMPARISON:  None FINDINGS: CT HEAD FINDINGS Brain: There is atrophy and chronic small vessel disease changes. No acute intracranial abnormality. Specifically, no hemorrhage, hydrocephalus, mass lesion, acute infarction, or significant intracranial injury. Vascular: No hyperdense vessel or unexpected calcification. Skull: No acute calvarial abnormality. Sinuses/Orbits: Mucosal thickening throughout the paranasal sinuses. Complete opacification of the right maxillary sinus. Other: None CT CERVICAL SPINE FINDINGS Alignment: No subluxation Skull base and vertebrae: No acute fracture. No primary bone lesion or focal pathologic process. Soft tissues and spinal canal: No prevertebral fluid or swelling.  No visible canal hematoma. Disc levels: Diffuse degenerative disc disease with disc space narrowing and spurring. Diffuse bilateral degenerative facet disease. Upper chest: No acute findings Other: Bilateral carotid bulb calcifications. IMPRESSION: Atrophy, chronic small vessel disease. Diffuse degenerative disc and facet disease throughout the cervical spine. No acute bony abnormality. Electronically Signed   By: Rolm Baptise M.D.   On: 12/23/2017 21:30   Dg Hip Unilat W Or W/o Pelvis 2-3 Views Left  Result Date: 12/23/2017 CLINICAL DATA:  Fall, contusion EXAM: DG HIP (WITH OR WITHOUT PELVIS) 2-3V LEFT COMPARISON:  None. FINDINGS: Pubic symphysis and rami are intact. Prior right hip replacement with normal alignment. Severe arthritis of the left hip with obliteration of joint space, subarticular sclerosis and cyst formation. The left femoral neck is poorly evaluated due to bony summation. IMPRESSION: 1. Advanced arthritis of the left hip. Limited evaluation of left femoral neck due to bony superimposition. 2. Prior right hip replacement without acute abnormality. Electronically Signed   By: Donavan Foil M.D.   On: 12/23/2017 21:11    Procedures Procedures (including critical care time)  Medications Ordered in ED Medications  sodium chloride 0.9 % bolus 500 mL (has no administration in time range)     Initial Impression / Assessment and Plan / ED Course  I have reviewed the triage vital signs and the nursing notes.  Pertinent labs & imaging results that were available during my care of the patient were reviewed by me and considered in my medical decision making (see chart for details).  Iv ns bolus. Labs. Ct.   Reviewed nursing notes and prior charts for additional history.   Labs reviewed - ck elevated. ua w ketones. Mild aki.   Additional iv ns bolus.   hospitalists consulted for admission.  cts reviewed - no acute process.    Final Clinical Impressions(s) / ED Diagnoses    Final diagnoses:  None    ED Discharge Orders    None       Lajean Saver, MD 12/23/17 [id number]

## 2017-12-23 NOTE — ED Notes (Signed)
Bed: YT11 Expected date:  Expected time:  Means of arrival:  Comments: 84 yo f on the floor for unknown amount of time

## 2017-12-23 NOTE — ED Triage Notes (Signed)
Pt arriving via [facility] from home for a fall. Family reports pt was found on the floor and time on floor is unknown. Pt lives alone. Pt A&O x3. Complaining of left side pain. C-collar present as precaution.

## 2017-12-23 NOTE — ED Notes (Signed)
ED TO INPATIENT HANDOFF REPORT  Name/Age/Gender [name] 84 y.o. female  Code Status   Home/SNF/Other Home  Chief Complaint fall  Level of Care/Admitting Diagnosis ED Disposition    ED Disposition Condition [facility] Area: [place] [[id number]]  Level of Care: Med-Surg [16]  Diagnosis: Fall [290176]  Admitting Physician: Elwyn Reach [[id number]]  Attending Physician: Elwyn Reach [[id number]]  Estimated length of stay: past midnight tomorrow  Certification:: I certify this patient will need inpatient services for at least 2 midnights  PT Class (Do Not Modify): Inpatient [101]  PT Acc Code (Do Not Modify): Private [1]       Medical History Past Medical History:  Diagnosis Date  . Allergy   . Anemia 2011   Since last visit she has had a Gi evaluation for newer onset Iron deficiiency anemia that was felt secondary to avm in duodenum rx with laser ablation.  . Arthritis    in back  . Asthma   . Cancer ([place])    rt breast  . GERD (gastroesophageal reflux disease)   . Hyperlipidemia   . Hypertension     Allergies No Known Allergies  IV Location/Drains/Wounds Patient Lines/Drains/Airways Status   Active Line/Drains/Airways    Name:   Placement date:   Placement time:   Site:   Days:   Peripheral IV 12/23/17 Left Antecubital   12/23/17    1920    Antecubital   less than 1          Labs/Imaging Results for orders placed or performed during the hospital encounter of 12/23/17 (from the past 48 hour(s))  Basic metabolic panel     Status: Abnormal   Collection Time: 12/23/17  7:48 PM  Result Value Ref Range   Sodium 138 135 - 145 mmol/L   Potassium 5.1 3.5 - 5.1 mmol/L   Chloride 104 98 - 111 mmol/L   CO2 20 (L) 22 - 32 mmol/L   Glucose, Bld 106 (H) 70 - 99 mg/dL   BUN 26 (H) 8 - 23 mg/dL   Creatinine, Ser 0.88 0.44 - 1.00 mg/dL   Calcium 8.8 (L) 8.9 - 10.3 mg/dL   GFR calc non Af Amer 58 (L) >60 mL/min   GFR calc  Af Amer >60 >60 mL/min    Comment: (NOTE) The eGFR has been calculated using the CKD EPI equation. This calculation has not been validated in all clinical situations. eGFR's persistently <60 mL/min signify possible Chronic Kidney Disease.    Anion gap 14 5 - 15    Comment: Performed at [facility], [place] [address] [place], [place] [address]  Urinalysis, Routine w reflex microscopic     Status: Abnormal   Collection Time: 12/23/17  7:48 PM  Result Value Ref Range   Color, Urine YELLOW YELLOW   APPearance CLEAR CLEAR   Specific Gravity, Urine 1.019 1.005 - 1.030   pH 7.0 5.0 - 8.0   Glucose, UA NEGATIVE NEGATIVE mg/dL   Hgb urine dipstick NEGATIVE NEGATIVE   Bilirubin Urine NEGATIVE NEGATIVE   Ketones, ur 80 (A) NEGATIVE mg/dL   Protein, ur 30 (A) NEGATIVE mg/dL   Nitrite NEGATIVE NEGATIVE   Leukocytes, UA NEGATIVE NEGATIVE   RBC / HPF 0-5 0 - 5 RBC/hpf   WBC, UA 0-5 0 - 5 WBC/hpf   Bacteria, UA NONE SEEN NONE SEEN   Mucus PRESENT     Comment: Performed at [facility],  [place] [address] [place], [place] [address]  CK     Status: Abnormal   Collection Time: 12/23/17  7:48 PM  Result Value Ref Range   Total CK 1,886 (H) 38 - 234 U/L    Comment: Performed at [facility], [place] [address], [place], [place] [address]  CBC with Differential/Platelet     Status: Abnormal   Collection Time: 12/23/17  9:01 PM  Result Value Ref Range   WBC 17.8 (H) 4.0 - 10.5 K/uL   RBC 4.80 3.87 - 5.11 MIL/uL   Hemoglobin 11.9 (L) 12.0 - 15.0 g/dL   HCT 38.9 36.0 - 46.0 %   MCV 81.0 80.0 - 100.0 fL   MCH 24.8 (L) 26.0 - 34.0 pg   MCHC 30.6 30.0 - 36.0 g/dL   RDW 15.9 (H) 11.5 - 15.5 %   Platelets 355 150 - 400 K/uL   nRBC 0.0 0.0 - 0.2 %   Neutrophils Relative % 86 %   Neutro Abs 15.3 (H) 1.7 - 7.7 K/uL   Lymphocytes Relative 6 %   Lymphs Abs 1.1 0.7 - 4.0 K/uL   Monocytes Relative 7 %   Monocytes Absolute 1.2 (H) 0.1 - 1.0 K/uL    Eosinophils Relative 0 %   Eosinophils Absolute 0.0 0.0 - 0.5 K/uL   Basophils Relative 0 %   Basophils Absolute 0.0 0.0 - 0.1 K/uL   Immature Granulocytes 1 %   Abs Immature Granulocytes 0.12 (H) 0.00 - 0.07 K/uL    Comment: Performed at [facility], [place] [address] [place], [place] [address]   Dg Shoulder Right  Result Date: 12/23/2017 CLINICAL DATA:  Fall, contusion EXAM: RIGHT SHOULDER - 2+ VIEW COMPARISON:  None. FINDINGS: Moderate AC joint degenerative change. Mild to moderate glenohumeral degenerative change. No fracture or dislocation. IMPRESSION: No acute osseous abnormality. Electronically Signed   By: Donavan Foil M.D.   On: 12/23/2017 21:10   Ct Head Wo Contrast  Result Date: 12/23/2017 CLINICAL DATA:  Found down.  Left side pain EXAM: CT HEAD WITHOUT CONTRAST CT CERVICAL SPINE WITHOUT CONTRAST TECHNIQUE: Multidetector CT imaging of the head and cervical spine was performed following the standard protocol without intravenous contrast. Multiplanar CT image reconstructions of the cervical spine were also generated. COMPARISON:  None FINDINGS: CT HEAD FINDINGS Brain: There is atrophy and chronic small vessel disease changes. No acute intracranial abnormality. Specifically, no hemorrhage, hydrocephalus, mass lesion, acute infarction, or significant intracranial injury. Vascular: No hyperdense vessel or unexpected calcification. Skull: No acute calvarial abnormality. Sinuses/Orbits: Mucosal thickening throughout the paranasal sinuses. Complete opacification of the right maxillary sinus. Other: None CT CERVICAL SPINE FINDINGS Alignment: No subluxation Skull base and vertebrae: No acute fracture. No primary bone lesion or focal pathologic process. Soft tissues and spinal canal: No prevertebral fluid or swelling. No visible canal hematoma. Disc levels: Diffuse degenerative disc disease with disc space narrowing and spurring. Diffuse bilateral degenerative facet disease. Upper  chest: No acute findings Other: Bilateral carotid bulb calcifications. IMPRESSION: Atrophy, chronic small vessel disease. Diffuse degenerative disc and facet disease throughout the cervical spine. No acute bony abnormality. Electronically Signed   By: Rolm Baptise M.D.   On: 12/23/2017 21:30   Ct Cervical Spine Wo Contrast  Result Date: 12/23/2017 CLINICAL DATA:  Found down.  Left side pain EXAM: CT HEAD WITHOUT CONTRAST CT CERVICAL SPINE WITHOUT CONTRAST TECHNIQUE: Multidetector CT imaging of the head and cervical spine was performed following the standard protocol without intravenous contrast.  Multiplanar CT image reconstructions of the cervical spine were also generated. COMPARISON:  None FINDINGS: CT HEAD FINDINGS Brain: There is atrophy and chronic small vessel disease changes. No acute intracranial abnormality. Specifically, no hemorrhage, hydrocephalus, mass lesion, acute infarction, or significant intracranial injury. Vascular: No hyperdense vessel or unexpected calcification. Skull: No acute calvarial abnormality. Sinuses/Orbits: Mucosal thickening throughout the paranasal sinuses. Complete opacification of the right maxillary sinus. Other: None CT CERVICAL SPINE FINDINGS Alignment: No subluxation Skull base and vertebrae: No acute fracture. No primary bone lesion or focal pathologic process. Soft tissues and spinal canal: No prevertebral fluid or swelling. No visible canal hematoma. Disc levels: Diffuse degenerative disc disease with disc space narrowing and spurring. Diffuse bilateral degenerative facet disease. Upper chest: No acute findings Other: Bilateral carotid bulb calcifications. IMPRESSION: Atrophy, chronic small vessel disease. Diffuse degenerative disc and facet disease throughout the cervical spine. No acute bony abnormality. Electronically Signed   By: Rolm Baptise M.D.   On: 12/23/2017 21:30   Dg Hip Unilat W Or W/o Pelvis 2-3 Views Left  Result Date: 12/23/2017 CLINICAL DATA:   Fall, contusion EXAM: DG HIP (WITH OR WITHOUT PELVIS) 2-3V LEFT COMPARISON:  None. FINDINGS: Pubic symphysis and rami are intact. Prior right hip replacement with normal alignment. Severe arthritis of the left hip with obliteration of joint space, subarticular sclerosis and cyst formation. The left femoral neck is poorly evaluated due to bony summation. IMPRESSION: 1. Advanced arthritis of the left hip. Limited evaluation of left femoral neck due to bony superimposition. 2. Prior right hip replacement without acute abnormality. Electronically Signed   By: Donavan Foil M.D.   On: 12/23/2017 21:11   EKG Interpretation  Date/Time:  Monday December 23 2017 19:41:31 EDT Ventricular Rate:  86 PR Interval:    QRS Duration: 108 QT Interval:  395 QTC Calculation: 473 R Axis:   20 Text Interpretation:  Sinus rhythm Nonspecific T wave abnormality No significant change since last tracing Confirmed by Lajean Saver [phone]) on 12/23/2017 8:33:25 PM   Pending Labs Unresulted Labs (From admission, onward)    Start     Ordered   12/23/17 1938  CBC with Differential  STAT,   STAT     12/23/17 1937   Signed and Held  CBC  (enoxaparin (LOVENOX)    CrCl >/= 30 ml/min)  Once,   R    Comments:  Baseline for enoxaparin therapy IF NOT ALREADY DRAWN.  Notify MD if PLT < 100 K.    Signed and Held   Signed and Held  Creatinine, serum  (enoxaparin (LOVENOX)    CrCl >/= 30 ml/min)  Once,   R    Comments:  Baseline for enoxaparin therapy IF NOT ALREADY DRAWN.    Signed and Held   Signed and Held  Creatinine, serum  (enoxaparin (LOVENOX)    CrCl >/= 30 ml/min)  Weekly,   R    Comments:  while on enoxaparin therapy    Signed and Held   Signed and Held  Comprehensive metabolic panel  Tomorrow morning,   R     Signed and Held   Signed and Held  CBC  Tomorrow morning,   R     Signed and Held          Vitals/Pain Today's Vitals   12/23/17 1941 12/23/17 2000 12/23/17 2030 12/23/17 2200  BP: 129/68 (!) 146/61  (!) 160/74 (!) 157/72  Pulse: 87 78 80 75  Resp: 14 19 (!) 25 14  Temp:  98.1 F (36.7 C)     TempSrc: Oral     SpO2: 98% 97% 98% 98%    Isolation Precautions No active isolations  Medications Medications  sodium chloride 0.9 % bolus 1,000 mL (1,000 mLs Intravenous New Bag/Given 12/23/17 2245)  sodium chloride 0.9 % bolus 500 mL (500 mLs Intravenous New Bag/Given 12/23/17 2145)    Mobility walks with device

## 2017-12-23 NOTE — H&P (Signed)
History and Physical   AMELIANNA MELLER BPZ:[id number] DOB: [birth year] DOA: 12/23/2017  Referring MD/NP/PA: Lajean Saver, MD  PCP: Burnis Medin, MD   Patient coming from: Home  Chief Complaint: Fall and confusion  HPI: [name] is a 84 y.o. female with medical history significant of hypertension, hyperlipidemia, GERD, history of breast cancer remotely who lives at home independently.  Patient uses a cane to walk around.  She is usually active in her weight.  Daughter talked to patient yesterday around 4:30 PM.  They tried to call her this morning did not get any answer.  She was found around 6 PM today laying down around the couch.  Patient was confused and not sure of how long she has been there.  Her drapes were drawn and she usually opened them off in the mornings so suspected that she must have fallen either at night or early in the morning.  She is sustained a bruise under her right shoulder.  Patient is not able to give adequate history.  She has a life alarm but did not use it.  She told family she fell out of bed but she was found in the living room.  At this point she appears dehydrated and minimally elevated CPK.  Patient is being admitted for work-up and evaluation for safety reasons.  Also has acute kidney injury..  ED Course: Temperature is 98.1 blood pressure 160/74 pulse 87 respiratory 25 oxygen sat 98% room air.  She has a white count of 17.8 hemoglobin 11.9 and platelet 355.  Sodium 138 potassium 5.1 chloride 104.  CO2 of 20 with a BUN 26 and creatinine 0.88.  Calcium 8.8 and glucose 106.  Urinalysis is negative.  CT his head with C-spine as well as x-ray of the right shoulder and hips were all within normal.  No evidence of fractures.  CPK is elevated at [place].  Patient has received fluid in the ER and is being admitted for further work-up.  Review of Systems: As per HPI otherwise 10 point review of systems negative.    Past Medical History:  Diagnosis Date  .  Allergy   . Anemia 2011   Since last visit she has had a Gi evaluation for newer onset Iron deficiiency anemia that was felt secondary to avm in duodenum rx with laser ablation.  . Arthritis    in back  . Asthma   . Cancer ([place])    rt breast  . GERD (gastroesophageal reflux disease)   . Hyperlipidemia   . Hypertension     Past Surgical History:  Procedure Laterality Date  . BREAST LUMPECTOMY  04/26/2010   right  . COLONOSCOPY WITH PROPOFOL N/A 09/14/2013   Procedure: COLONOSCOPY WITH PROPOFOL;  Surgeon: Inda Castle, MD;  Location: [facility]COPY;  Service: Endoscopy;  Laterality: N/A;  . HOT HEMOSTASIS N/A 09/14/2013   Procedure: HOT HEMOSTASIS (ARGON PLASMA COAGULATION/BICAP);  Surgeon: Inda Castle, MD;  Location: [facility];  Service: Endoscopy;  Laterality: N/A;  . LARYNGECTOMY  20-25 yrs ago  . PARTIAL HIP ARTHROPLASTY     rt     reports that she quit smoking about 49 years ago. Her smoking use included cigarettes. She has a 1.00 pack-year smoking history. She has never used smokeless tobacco. She reports that she does not drink alcohol or use drugs.  No Known Allergies  Family History  Problem Relation Age of Onset  . Cancer Mother        breast  and ovarian     Prior to Admission medications   Medication Sig Start Date End Date Taking? Authorizing Provider  amLODipine (NORVASC) 10 MG tablet TAKE ONE TABLET BY MOUTH DAILY 11/27/17  Yes Panosh, Standley Brooking, MD  EPIPEN 2-PAK 0.3 MG/0.3ML SOAJ injection Inject 0.3 mg into the muscle once.  08/18/14  Yes [provider]  lisinopril-hydrochlorothiazide (PRINZIDE,ZESTORETIC) 20-12.5 MG tablet TAKE ONE TABLET BY MOUTH TWO TIMES A DAY 10/25/17  Yes Panosh, Standley Brooking, MD  simvastatin (ZOCOR) 20 MG tablet Take 1 tablet (20 mg total) by mouth at bedtime. 06/12/13  Yes Panosh, Standley Brooking, MD  budesonide-formoterol [facility]) 160-4.5 MCG/ACT inhaler Inhale 2 puffs into the lungs 2 (two) times daily as needed (Shortness of breath).      [provider]  cycloSPORINE (RESTASIS) 0.05 % ophthalmic emulsion Place 1 drop into both eyes 2 (two) times daily.    [provider]  ferrous sulfate 325 (65 FE) MG tablet Take 325 mg by mouth daily with breakfast.    [provider]  fexofenadine (ALLEGRA) 180 MG tablet Take 180 mg by mouth.    [provider]  KLOR-CON M20 20 MEQ tablet TAKE 1 TABLET TWICE A DAY 05/09/15   Panosh, Standley Brooking, MD  Magnesium 500 MG CAPS Take 500 mg by mouth daily.    [provider]  montelukast (SINGULAIR) 10 MG tablet Take 10 mg by mouth at bedtime.    [provider]  omeprazole (PRILOSEC) 20 MG capsule Take 1 capsule (20 mg total) by mouth daily as needed (Acid Reflux). 11/02/14   Panosh, Standley Brooking, MD  solifenacin (VESICARE) 5 MG tablet Take 1 tablet (5 mg total) by mouth daily. 06/12/13   Burnis Medin, MD    Physical Exam: Vitals:   12/23/17 1941 12/23/17 2000 12/23/17 2030 12/23/17 2200  BP: 129/68 (!) 146/61 (!) 160/74 (!) 157/72  Pulse: 87 78 80 75  Resp: 14 19 (!) 25 14  Temp: 98.1 F (36.7 C)     TempSrc: Oral     SpO2: 98% 97% 98% 98%      Constitutional: NAD, calm, comfortable Vitals:   12/23/17 1941 12/23/17 2000 12/23/17 2030 12/23/17 2200  BP: 129/68 (!) 146/61 (!) 160/74 (!) 157/72  Pulse: 87 78 80 75  Resp: 14 19 (!) 25 14  Temp: 98.1 F (36.7 C)     TempSrc: Oral     SpO2: 98% 97% 98% 98%   Eyes: PERRL, lids and conjunctivae normal ENMT: Mucous membranes are moist. Posterior pharynx clear of any exudate or lesions.Normal dentition.  Neck: normal, supple, no masses, no thyromegaly Respiratory: clear to auscultation bilaterally, no wheezing, no crackles. Normal respiratory effort. No accessory muscle use.  Cardiovascular: Regular rate and rhythm, no murmurs / rubs / gallops. No extremity edema. 2+ pedal pulses. No carotid bruits.  Abdomen: no tenderness, no masses palpated. No hepatosplenomegaly. Bowel sounds positive.    Musculoskeletal: no clubbing / cyanosis. No joint deformity upper and lower extremities. Good ROM, no contractures. Normal muscle tone.  Skin: Patient has bruises on the right shoulder right hip ,no rashes, lesions, ulcers. No induration Neurologic: CN 2-12 grossly intact. Sensation intact, DTR normal. Strength 5/5 in all 4.  Psychiatric: Slightly confused but arousable and then alert and oriented x 3. .     Labs on Admission: I have personally reviewed following labs and imaging studies  CBC: Recent Labs  Lab 12/23/17 2101  WBC 17.8*  NEUTROABS 15.3*  HGB  11.9*  HCT 38.9  MCV 81.0  PLT 937   Basic Metabolic Panel: Recent Labs  Lab 12/23/17 1948  NA 138  K 5.1  CL 104  CO2 20*  GLUCOSE 106*  BUN 26*  CREATININE 0.88  CALCIUM 8.8*   GFR: CrCl cannot be calculated (Unknown ideal weight.). Liver Function Tests: No results for input(s): AST, ALT, ALKPHOS, BILITOT, PROT, ALBUMIN in the last 168 hours. No results for input(s): LIPASE, AMYLASE in the last 168 hours. No results for input(s): AMMONIA in the last 168 hours. Coagulation Profile: No results for input(s): INR, PROTIME in the last 168 hours. Cardiac Enzymes: Recent Labs  Lab 12/23/17 1948  CKTOTAL 1,886*   BNP (last 3 results) No results for input(s): PROBNP in the last 8760 hours. HbA1C: No results for input(s): HGBA1C in the last 72 hours. CBG: No results for input(s): GLUCAP in the last 168 hours. Lipid Profile: No results for input(s): CHOL, HDL, LDLCALC, TRIG, CHOLHDL, LDLDIRECT in the last 72 hours. Thyroid Function Tests: No results for input(s): TSH, T4TOTAL, FREET4, T3FREE, THYROIDAB in the last 72 hours. Anemia Panel: No results for input(s): VITAMINB12, FOLATE, FERRITIN, TIBC, IRON, RETICCTPCT in the last 72 hours. Urine analysis:    Component Value Date/Time   COLORURINE YELLOW 12/23/2017 1948   APPEARANCEUR CLEAR 12/23/2017 1948   LABSPEC 1.019 12/23/2017 1948   PHURINE 7.0 12/23/2017  1948   GLUCOSEU NEGATIVE 12/23/2017 1948   GLUCOSEU NEGATIVE 09/14/2014 1706   HGBUR NEGATIVE 12/23/2017 1948   HGBUR 1+ 01/30/2010 1130   BILIRUBINUR NEGATIVE 12/23/2017 1948   BILIRUBINUR n 10/18/2017 1003   KETONESUR 80 (A) 12/23/2017 1948   PROTEINUR 30 (A) 12/23/2017 1948   UROBILINOGEN 0.2 10/18/2017 1003   UROBILINOGEN 0.2 09/14/2014 1706   NITRITE NEGATIVE 12/23/2017 1948   LEUKOCYTESUR NEGATIVE 12/23/2017 1948   Sepsis Labs: @LABRCNTIP (procalcitonin:4,lacticidven:4) )No results found for this or any previous visit (from the past 240 hour(s)).   Radiological Exams on Admission: Dg Shoulder Right  Result Date: 12/23/2017 CLINICAL DATA:  Fall, contusion EXAM: RIGHT SHOULDER - 2+ VIEW COMPARISON:  None. FINDINGS: Moderate AC joint degenerative change. Mild to moderate glenohumeral degenerative change. No fracture or dislocation. IMPRESSION: No acute osseous abnormality. Electronically Signed   By: Donavan Foil M.D.   On: 12/23/2017 21:10   Ct Head Wo Contrast  Result Date: 12/23/2017 CLINICAL DATA:  Found down.  Left side pain EXAM: CT HEAD WITHOUT CONTRAST CT CERVICAL SPINE WITHOUT CONTRAST TECHNIQUE: Multidetector CT imaging of the head and cervical spine was performed following the standard protocol without intravenous contrast. Multiplanar CT image reconstructions of the cervical spine were also generated. COMPARISON:  None FINDINGS: CT HEAD FINDINGS Brain: There is atrophy and chronic small vessel disease changes. No acute intracranial abnormality. Specifically, no hemorrhage, hydrocephalus, mass lesion, acute infarction, or significant intracranial injury. Vascular: No hyperdense vessel or unexpected calcification. Skull: No acute calvarial abnormality. Sinuses/Orbits: Mucosal thickening throughout the paranasal sinuses. Complete opacification of the right maxillary sinus. Other: None CT CERVICAL SPINE FINDINGS Alignment: No subluxation Skull base and vertebrae: No acute  fracture. No primary bone lesion or focal pathologic process. Soft tissues and spinal canal: No prevertebral fluid or swelling. No visible canal hematoma. Disc levels: Diffuse degenerative disc disease with disc space narrowing and spurring. Diffuse bilateral degenerative facet disease. Upper chest: No acute findings Other: Bilateral carotid bulb calcifications. IMPRESSION: Atrophy, chronic small vessel disease. Diffuse degenerative disc and facet disease throughout the cervical spine. No acute bony abnormality. Electronically  Signed   By: Rolm Baptise M.D.   On: 12/23/2017 21:30   Ct Cervical Spine Wo Contrast  Result Date: 12/23/2017 CLINICAL DATA:  Found down.  Left side pain EXAM: CT HEAD WITHOUT CONTRAST CT CERVICAL SPINE WITHOUT CONTRAST TECHNIQUE: Multidetector CT imaging of the head and cervical spine was performed following the standard protocol without intravenous contrast. Multiplanar CT image reconstructions of the cervical spine were also generated. COMPARISON:  None FINDINGS: CT HEAD FINDINGS Brain: There is atrophy and chronic small vessel disease changes. No acute intracranial abnormality. Specifically, no hemorrhage, hydrocephalus, mass lesion, acute infarction, or significant intracranial injury. Vascular: No hyperdense vessel or unexpected calcification. Skull: No acute calvarial abnormality. Sinuses/Orbits: Mucosal thickening throughout the paranasal sinuses. Complete opacification of the right maxillary sinus. Other: None CT CERVICAL SPINE FINDINGS Alignment: No subluxation Skull base and vertebrae: No acute fracture. No primary bone lesion or focal pathologic process. Soft tissues and spinal canal: No prevertebral fluid or swelling. No visible canal hematoma. Disc levels: Diffuse degenerative disc disease with disc space narrowing and spurring. Diffuse bilateral degenerative facet disease. Upper chest: No acute findings Other: Bilateral carotid bulb calcifications. IMPRESSION: Atrophy,  chronic small vessel disease. Diffuse degenerative disc and facet disease throughout the cervical spine. No acute bony abnormality. Electronically Signed   By: Rolm Baptise M.D.   On: 12/23/2017 21:30   Dg Hip Unilat W Or W/o Pelvis 2-3 Views Left  Result Date: 12/23/2017 CLINICAL DATA:  Fall, contusion EXAM: DG HIP (WITH OR WITHOUT PELVIS) 2-3V LEFT COMPARISON:  None. FINDINGS: Pubic symphysis and rami are intact. Prior right hip replacement with normal alignment. Severe arthritis of the left hip with obliteration of joint space, subarticular sclerosis and cyst formation. The left femoral neck is poorly evaluated due to bony summation. IMPRESSION: 1. Advanced arthritis of the left hip. Limited evaluation of left femoral neck due to bony superimposition. 2. Prior right hip replacement without acute abnormality. Electronically Signed   By: Donavan Foil M.D.   On: 12/23/2017 21:11    EKG: Independently reviewed.  It shows sinus rhythm with a rate of 86, no significant ST changes.  Assessment/Plan Principal Problem:   Fall Active Problems:   Essential hypertension   GERD   Malignant neoplasm of female breast ([place])   Rhabdomyolysis   Leucocytosis     #1 status post fall: Patient had unobserved fall.  This could be mechanical or syncopal in nature.  With her dehydration and mild rhabdo from the 4 patient could have had mild CVA.  Head CT is negative.  We will admit the patient and get PT and OT evaluation.  Consider MRI of the brain as well.  Hydrate patient.  Discussed with family regarding disposition based on PT and OT evaluation.  #2 rhabdomyolysis: Mild secondary to fall.  Hydrate and monitor CPK in the morning.  #3 leukocytosis: Probably due to demargination from dehydration.  Continue monitoring.  #4 hypertension: Blood pressure at this point is stable.  Continue home medications except diuretics.  #5 GERD: Continue with PPIs.   DVT prophylaxis: Heparin Code Status: Full Family  Communication: Daughter and son-in-law in the room Disposition Plan: To be determined Consults called: Physical therapy and Occupational Therapy Admission status: Inpatient  Severity of Illness: The appropriate patient status for this patient is INPATIENT. Inpatient status is judged to be reasonable and necessary in order to provide the required intensity of service to ensure the patient's safety. The patient's presenting symptoms, physical exam findings, and initial radiographic  and laboratory data in the context of their chronic comorbidities is felt to place them at high risk for further clinical deterioration. Furthermore, it is not anticipated that the patient will be medically stable for discharge from the hospital within 2 midnights of admission. The following factors support the patient status of inpatient.   " The patient's presenting symptoms include fall with confusion. " The worrisome physical exam findings include skin rashes and evidence of fall. " The initial radiographic and laboratory data are worrisome because of elevated CPK. " The chronic co-morbidities include hypertension and GERD.   * I certify that at the point of admission it is my clinical judgment that the patient will require inpatient hospital care spanning beyond 2 midnights from the point of admission due to high intensity of service, high risk for further deterioration and high frequency of surveillance required.Barbette Merino MD [facility] Pager [phone]  If 7PM-7AM, please contact night-coverage [email or web address] Password [email or web address]  12/23/2017, 10:55 PM

## 2017-12-24 ENCOUNTER — Inpatient Hospital Stay (HOSPITAL_COMMUNITY): Payer: Medicare Other

## 2017-12-24 DIAGNOSIS — I10 Essential (primary) hypertension: Secondary | ICD-10-CM

## 2017-12-24 DIAGNOSIS — D72829 Elevated white blood cell count, unspecified: Secondary | ICD-10-CM | POA: Diagnosis present

## 2017-12-24 DIAGNOSIS — I5030 Unspecified diastolic (congestive) heart failure: Secondary | ICD-10-CM

## 2017-12-24 LAB — CBC
HCT: 32.5 % — ABNORMAL LOW (ref 36.0–46.0)
Hemoglobin: 9.9 g/dL — ABNORMAL LOW (ref 12.0–15.0)
MCH: 25.1 pg — ABNORMAL LOW (ref 26.0–34.0)
MCHC: 30.5 g/dL (ref 30.0–36.0)
MCV: 82.3 fL (ref 80.0–100.0)
Platelets: 322 10*3/uL (ref 150–400)
RBC: 3.95 MIL/uL (ref 3.87–5.11)
RDW: 15.9 % — ABNORMAL HIGH (ref 11.5–15.5)
WBC: 15.7 10*3/uL — ABNORMAL HIGH (ref 4.0–10.5)
nRBC: 0 % (ref 0.0–0.2)

## 2017-12-24 LAB — COMPREHENSIVE METABOLIC PANEL
ALT: 26 U/L (ref 0–44)
AST: 55 U/L — ABNORMAL HIGH (ref 15–41)
Albumin: 2.8 g/dL — ABNORMAL LOW (ref 3.5–5.0)
Alkaline Phosphatase: 52 U/L (ref 38–126)
Anion gap: 8 (ref 5–15)
BUN: 22 mg/dL (ref 8–23)
CO2: 23 mmol/L (ref 22–32)
Calcium: 8.1 mg/dL — ABNORMAL LOW (ref 8.9–10.3)
Chloride: 111 mmol/L (ref 98–111)
Creatinine, Ser: 0.59 mg/dL (ref 0.44–1.00)
GFR calc Af Amer: >60 mL/min (ref >60)
GFR calc non Af Amer: >60 mL/min (ref >60)
Glucose, Bld: 78 mg/dL (ref 70–99)
Potassium: 3 mmol/L — ABNORMAL LOW (ref 3.5–5.1)
Sodium: 142 mmol/L (ref 135–145)
Total Bilirubin: 0.9 mg/dL (ref 0.3–1.2)
Total Protein: 5.7 g/dL — ABNORMAL LOW (ref 6.5–8.1)

## 2017-12-24 LAB — TSH: TSH: 0.214 u[IU]/mL — ABNORMAL LOW (ref 0.350–4.500)

## 2017-12-24 LAB — CK: Total CK: 1171 U/L — ABNORMAL HIGH (ref 38–234)

## 2017-12-24 LAB — T4, FREE: Free T4: 1 ng/dL (ref 0.82–1.77)

## 2017-12-24 LAB — ECHOCARDIOGRAM COMPLETE

## 2017-12-24 MED ORDER — HYDROCHLOROTHIAZIDE 12.5 MG PO CAPS
12.5000 mg | ORAL_CAPSULE | Freq: Two times a day (BID) | ORAL | Status: DC
  Administered 2017-12-24 – 2017-12-26 (×6): 12.5 mg via ORAL
  Filled 2017-12-24 (×6): qty 1

## 2017-12-24 MED ORDER — ACETAMINOPHEN 325 MG PO TABS
650.0000 mg | ORAL_TABLET | Freq: Four times a day (QID) | ORAL | Status: DC | PRN
  Administered 2017-12-24: 650 mg via ORAL
  Filled 2017-12-24: qty 2

## 2017-12-24 MED ORDER — TRAMADOL HCL 50 MG PO TABS
50.0000 mg | ORAL_TABLET | Freq: Four times a day (QID) | ORAL | Status: DC | PRN
  Administered 2017-12-24 – 2017-12-25 (×3): 50 mg via ORAL
  Filled 2017-12-24 (×3): qty 1

## 2017-12-24 MED ORDER — LISINOPRIL 20 MG PO TABS
20.0000 mg | ORAL_TABLET | Freq: Two times a day (BID) | ORAL | Status: DC
  Administered 2017-12-24 – 2017-12-26 (×5): 20 mg via ORAL
  Filled 2017-12-24 (×6): qty 1

## 2017-12-24 NOTE — Evaluation (Addendum)
Occupational Therapy Evaluation Patient Details Name: [name] MRN: [id number] DOB: 11-Jun-1930 Today's Date: 12/24/2017    History of Present Illness [name] is a 84 y.o. year old female with medical history significant for HTN, HLD, GERD, history of breast cancer at home independently PTA who presented on 12/23/2017 with unwitnessed fall.    Clinical Impression   With some encouragement, pt agreeable to up to [facility] and recliner this visit. Pt initially hesitant due to L LE soreness and L shoulder also 5/10 pain and feeling weak per her report. Informed nursing of L shoulder pain also. Daughter present for eval and stated they have assistance available between several family members at d/c. Hopeful that if pt can return to a level that family can safely manage, then pt can d/c home. Will continue to monitor. Pt will benefit from OT to progress ADL independence.     Follow Up Recommendations  Supervision/Assistance - 24 hour;Home health OT    Equipment Recommendations  None recommended by OT    Recommendations for Other Services       Precautions / Restrictions Precautions Precautions: Fall Restrictions Weight Bearing Restrictions: No      Mobility Bed Mobility Overal bed mobility: Needs Assistance Bed Mobility: Supine to Sit     Supine to sit: Mod assist;HOB elevated     General bed mobility comments: assist for trunk to upright and bilateral LEs over to EOB. Cues for hand placement.   Transfers Overall transfer level: Needs assistance Equipment used: None Transfers: Sit to/from [facility] Sit to Stand: Mod assist Stand pivot transfers: Mod assist       General transfer comment: cues for hand placement and forward weight shift for standing.     Balance Overall balance assessment: Needs assistance Sitting-balance support: Bilateral upper extremity supported Sitting balance-Leahy Scale: Fair     Standing balance support:  Bilateral upper extremity supported Standing balance-Leahy Scale: Poor                             ADL either performed or assessed with clinical judgement   ADL Overall ADL's : Needs assistance/impaired Eating/Feeding: Set up;Sitting   Grooming: Wash/dry hands;Set up;Sitting   Upper Body Bathing: Moderate assistance;Sitting Upper Body Bathing Details (indicate cue type and reason): due to soreness/pain in L shoulder.  Lower Body Bathing: Maximal assistance;Sit to/from stand   Upper Body Dressing : Moderate assistance;Sitting Upper Body Dressing Details (indicate cue type and reason): mod assist for sit to stand aspect.  Lower Body Dressing: Maximal assistance;Sit to/from stand   Toilet Transfer: Moderate assistance;Stand-pivot;BSC   Toileting- Clothing Manipulation and Hygiene: Maximal assistance;Sit to/from stand Toileting - Clothing Manipulation Details (indicate cue type and reason): Pt able to help perform toilet hygiene once in standing but mod assist for balance support.        General ADL Comments: Daughter present for session. Pt hesitant to get up initially but did agree to up to [facility] and recliner. Pt stating L LE feeling weak and sore and also the L shoulder with 5/10 pain with trying to raise it up off pillow. Informed nursing of L shoulder pain.      Vision Patient Visual Report: No change from baseline       Perception     Praxis      Pertinent Vitals/Pain Pain Assessment: 0-10 Pain Score: 5  Pain Location: L shoulder and L LE Pain Descriptors / Indicators: Sore  Pain Intervention(s): Monitored during session;Repositioned     Hand Dominance Left   Extremity/Trunk Assessment Upper Extremity Assessment Upper Extremity Assessment: LUE deficits/detail LUE Deficits / Details: Pt complaining of soreness/pain in L shoulder. Pt currently only able to minimally raise L shoulder off the pillow. Elbow, wrist and hand WFL. Informed nursing of L shoulder  pain.  Pt stated she noticed the weakness with trying to feed herself as she is L handed.            Communication Communication Communication: No difficulties   Cognition Arousal/Alertness: Awake/alert Behavior During Therapy: WFL for tasks assessed/performed Overall Cognitive Status: Within Functional Limits for tasks assessed                                     General Comments       Exercises     Shoulder Instructions      Home Living Family/patient expects to be discharged to:: Private residence Living Arrangements: Alone Available Help at Discharge: Family;Available 24 hours/day Type of Home: Apartment Home Access: Level entry     Home Layout: One level     Bathroom Shower/Tub: Teacher, early years/pre: Standard     Home Equipment: Cane - single point;Walker - 2 wheels;Toilet riser;Grab bars - tub/shower;Bedside commode          Prior Functioning/Environment Level of Independence: Independent with assistive device(s)        Comments: pt used cane to ambulate in/out of apt. She was cooking own meals and managing own laundry and running errands.         OT Problem List: Decreased strength;Decreased knowledge of use of DME or AE;Pain      OT Treatment/Interventions: Self-care/ADL training;DME and/or AE instruction;Therapeutic activities;Patient/family education    OT Goals(Current goals can be found in the care plan section) Acute Rehab OT Goals Patient Stated Goal: to move better OT Goal Formulation: With patient Time For Goal Achievement: 01/07/18 Potential to Achieve Goals: Good ADL Goals Pt Will Perform Grooming: with min guard assist;standing Pt Will Perform Lower Body Dressing: with min guard assist;sit to/from stand Pt Will Transfer to Toilet: with min guard assist;ambulating;bedside commode Pt Will Perform Toileting - Clothing Manipulation and hygiene: with min guard assist;sit to/from stand  OT Frequency: Min  2X/week   Barriers to D/C:            Co-evaluation              AM-PAC PT "6 Clicks" Daily Activity     Outcome Measure Help from another person eating meals?: A Little Help from another person taking care of personal grooming?: A Little Help from another person toileting, which includes using toliet, bedpan, or urinal?: A Lot Help from another person bathing (including washing, rinsing, drying)?: A Lot Help from another person to put on and taking off regular upper body clothing?: A Lot Help from another person to put on and taking off regular lower body clothing?: A Lot 6 Click Score: 14   End of Session Equipment Utilized During Treatment: Gait belt  Activity Tolerance: Patient limited by pain Patient left: in chair;with call bell/phone within reach;with chair alarm set  OT Visit Diagnosis: Muscle weakness (generalized) (M62.81);Unsteadiness on feet (R26.81);Pain Pain - Right/Left: Left Pain - part of body: Shoulder                Time: 1220-1250 OT Time Calculation (  min): 30 min Charges:  OT General Charges $OT Visit: 1 Visit OT Evaluation $OT Eval Moderate Complexity: 1 Mod OT Treatments $Therapeutic Activity: 8-22 mins    Pauline Aus OTR/L Acute Rehabilitation [phone] office number

## 2017-12-24 NOTE — Progress Notes (Signed)
  Echocardiogram 2D Echocardiogram has been performed.  [name] 12/24/2017, 2:05 PM

## 2017-12-24 NOTE — Progress Notes (Signed)
PT Cancellation Note  Patient Details Name: [name] MRN: [id number] DOB: [birth year]   Cancelled Treatment:    Reason Eval/Treat Not Completed: Patient at procedure or test/unavailable(pt is having cardiac ultrasound at present. Will follow. )   Philomena Doheny PT 12/24/2017  Acute Rehabilitation Services Pager [phone] Office [phone]

## 2017-12-24 NOTE — Progress Notes (Signed)
Patient bladder scanned since patient still has not had any urine output; she has 203 ml on bladder scan. Will continue to monitor patient and notify MD. Patient is not complaining of any discomfort.

## 2017-12-24 NOTE — Progress Notes (Signed)
Patient on purewick with no urine output since 1 am this morning on admision, patient denies any urge to urinate,bladder scanner was perform and 400 cc urine output was noted, patient was then  transferred to MRI for a scan and put back to her room and report was given to the day shift nurse about patient is still due to void. As of now patient still denies urge to urinate and stated  "this is unusual to me" . We will continue to monitor.

## 2017-12-24 NOTE — Progress Notes (Signed)
PT Cancellation Note  Patient Details Name: [name] MRN: [id number] DOB: 01/13/1931   Cancelled Treatment:    Reason Eval/Treat Not Completed: Pain limiting ability to participate;Fatigue/lethargy limiting ability to participate(pt pleasantly declined PT, she stated she's in pain and is fatigued. Notified RN of pt request for pain medicaiton. Will follow. )   Philomena Doheny PT 12/24/2017  Acute Rehabilitation Services Pager [phone] Office [phone]

## 2017-12-24 NOTE — Progress Notes (Addendum)
PROGRESS NOTE  [name]:[id number] DOB: 05-05-1930 DOA: 12/23/2017 PCP: Burnis Medin, MD  HPI/Brief Narrative  [name] is a 84 y.o. year old female with medical history significant for HTN, HLD, GERD, history of breast cancer with home independently who presented on 12/23/2017 with unwitnessed fall.  Unclear history daughter reports last known normal 4:26 PM (Monday afternoon) but did report a fall earlier that day in her room while in front of her chest drawer. She was found down in front of her screen door lying prone which family states is close to lots of furniture which she can trip on.    Subjective Can not remember fall. No complaints this morning  Assessment/Plan:  #Unwitnessed fall, suspect mechanical fall.    Work-up for syncope so far has been negative: MRI/MRA negative for acute stroke, XR and CT imaging of spine shows no acute fractures,EKG within normal limits, infection less likely with no normal ua remains afebrile,  No new medications, no electrolyte abnormalities, normal kidney function.  Patient is on several blood pressure medications so will check orthostatic vitals.  #Rhabdomyolysis, secondary to fall, improving. Continue IVF. Monitor kidney function  # Stress Leukocytosis, improving. Likely secondary stress response to fall. No signs of infection, afebrile and workup negative so far. Improving with IVF, will monitor  #Hypokalemia. Unclear etiology no GI losses. S/p oral repletion. Repeat BMP in am  #Urine Retention? Reports of in and out cath in ED. Monitor output here. Scheduled bladder scans q4 h and prn in and out  #HTN. Actually SBP in 160s earlier with no signs of soft BP. Will continue home BP meds: amlodipine, lisinopril, and HCTZ.   #Normocytic Anemia. Hgb 11.9 on admission now down to 9.9. Has history of iron deficiency Previous baseline in 04/2017 was 13.4. Maybe some element of hemoconcentration no admission. No gross signs or  symptoms of bleeding. Monitor daily cbc, continue home iron  #GERD. Continue protonix  #HLD. Continue zocor  Code Status: Full Code   Family Communication: daughter Mateo Flow and her husband Jenny Reichmann updated at bedside   Disposition Plan: IVF x 24 hours, PT/OT eval and treat.     Consultants:  none     Procedures:  none   Antimicrobials: Anti-infectives (From admission, onward)   None         Cultures:  none  Telemetry:none  DVT prophylaxis: lovenox   Objective: Vitals:   12/23/17 2300 12/23/17 2330 12/23/17 2359 12/24/17 0524  BP: (!) 148/63 (!) 146/65 (!) 161/77 (!) 115/50  Pulse: 81 86 89 79  Resp: 14 15 18 20   Temp:   97.8 F (36.6 C) 98.1 F (36.7 C)  TempSrc:   Oral Oral  SpO2: 98% 98% 97% 98%    Intake/Output Summary (Last 24 hours) at 12/24/2017 1023 Last data filed at 12/24/2017 0600 Gross per 24 hour  Intake 2325.74 ml  Output 1000 ml  Net 1325.74 ml   There were no vitals filed for this visit.  Exam:  Constitutional:elderly female Eyes: EOMI, anicteric, normal conjunctivae ENMT: Oropharynx with moist mucous membranes, normal dentition Cardiovascular: RRR no MRGs, with no peripheral edema Respiratory: Normal respiratory effort on room air, clear breath sounds  Abdomen: Soft,non-tender,  Skin: No rash ulcers, or lesions. Without skin tenting  Neurologic: Grossly no focal neuro deficit. Psychiatric:Appropriate affect, and mood. Mental status AAOx3  Data Reviewed: CBC: Recent Labs  Lab 12/23/17 2101 12/24/17 0548  WBC 17.8* 15.7*  NEUTROABS 15.3*  --   HGB  11.9* 9.9*  HCT 38.9 32.5*  MCV 81.0 82.3  PLT 355 619   Basic Metabolic Panel: Recent Labs  Lab 12/23/17 1948 12/24/17 0548  NA 138 142  K 5.1 3.0*  CL 104 111  CO2 20* 23  GLUCOSE 106* 78  BUN 26* 22  CREATININE 0.88 0.59  CALCIUM 8.8* 8.1*   GFR: CrCl cannot be calculated (Unknown ideal weight.). Liver Function Tests: Recent Labs  Lab 12/24/17 0548  AST  55*  ALT 26  ALKPHOS 52  BILITOT 0.9  PROT 5.7*  ALBUMIN 2.8*   No results for input(s): LIPASE, AMYLASE in the last 168 hours. No results for input(s): AMMONIA in the last 168 hours. Coagulation Profile: No results for input(s): INR, PROTIME in the last 168 hours. Cardiac Enzymes: Recent Labs  Lab 12/23/17 1948 12/24/17 0548  CKTOTAL 1,886* 1,171*   BNP (last 3 results) No results for input(s): PROBNP in the last 8760 hours. HbA1C: No results for input(s): HGBA1C in the last 72 hours. CBG: No results for input(s): GLUCAP in the last 168 hours. Lipid Profile: No results for input(s): CHOL, HDL, LDLCALC, TRIG, CHOLHDL, LDLDIRECT in the last 72 hours. Thyroid Function Tests: No results for input(s): TSH, T4TOTAL, FREET4, T3FREE, THYROIDAB in the last 72 hours. Anemia Panel: No results for input(s): VITAMINB12, FOLATE, FERRITIN, TIBC, IRON, RETICCTPCT in the last 72 hours. Urine analysis:    Component Value Date/Time   COLORURINE YELLOW 12/23/2017 1948   APPEARANCEUR CLEAR 12/23/2017 1948   LABSPEC 1.019 12/23/2017 1948   PHURINE 7.0 12/23/2017 1948   GLUCOSEU NEGATIVE 12/23/2017 1948   GLUCOSEU NEGATIVE 09/14/2014 1706   HGBUR NEGATIVE 12/23/2017 1948   HGBUR 1+ 01/30/2010 1130   BILIRUBINUR NEGATIVE 12/23/2017 1948   BILIRUBINUR n 10/18/2017 1003   KETONESUR 80 (A) 12/23/2017 1948   PROTEINUR 30 (A) 12/23/2017 1948   UROBILINOGEN 0.2 10/18/2017 1003   UROBILINOGEN 0.2 09/14/2014 1706   NITRITE NEGATIVE 12/23/2017 1948   LEUKOCYTESUR NEGATIVE 12/23/2017 1948   Sepsis Labs: @LABRCNTIP (procalcitonin:4,lacticidven:4)  )No results found for this or any previous visit (from the past 240 hour(s)).    Studies: Dg Shoulder Right  Result Date: 12/23/2017 CLINICAL DATA:  Fall, contusion EXAM: RIGHT SHOULDER - 2+ VIEW COMPARISON:  None. FINDINGS: Moderate AC joint degenerative change. Mild to moderate glenohumeral degenerative change. No fracture or dislocation.  IMPRESSION: No acute osseous abnormality. Electronically Signed   By: Donavan Foil M.D.   On: 12/23/2017 21:10   Ct Head Wo Contrast  Result Date: 12/23/2017 CLINICAL DATA:  Found down.  Left side pain EXAM: CT HEAD WITHOUT CONTRAST CT CERVICAL SPINE WITHOUT CONTRAST TECHNIQUE: Multidetector CT imaging of the head and cervical spine was performed following the standard protocol without intravenous contrast. Multiplanar CT image reconstructions of the cervical spine were also generated. COMPARISON:  None FINDINGS: CT HEAD FINDINGS Brain: There is atrophy and chronic small vessel disease changes. No acute intracranial abnormality. Specifically, no hemorrhage, hydrocephalus, mass lesion, acute infarction, or significant intracranial injury. Vascular: No hyperdense vessel or unexpected calcification. Skull: No acute calvarial abnormality. Sinuses/Orbits: Mucosal thickening throughout the paranasal sinuses. Complete opacification of the right maxillary sinus. Other: None CT CERVICAL SPINE FINDINGS Alignment: No subluxation Skull base and vertebrae: No acute fracture. No primary bone lesion or focal pathologic process. Soft tissues and spinal canal: No prevertebral fluid or swelling. No visible canal hematoma. Disc levels: Diffuse degenerative disc disease with disc space narrowing and spurring. Diffuse bilateral degenerative facet disease. Upper chest: No acute  findings Other: Bilateral carotid bulb calcifications. IMPRESSION: Atrophy, chronic small vessel disease. Diffuse degenerative disc and facet disease throughout the cervical spine. No acute bony abnormality. Electronically Signed   By: Rolm Baptise M.D.   On: 12/23/2017 21:30   Ct Cervical Spine Wo Contrast  Result Date: 12/23/2017 CLINICAL DATA:  Found down.  Left side pain EXAM: CT HEAD WITHOUT CONTRAST CT CERVICAL SPINE WITHOUT CONTRAST TECHNIQUE: Multidetector CT imaging of the head and cervical spine was performed following the standard protocol  without intravenous contrast. Multiplanar CT image reconstructions of the cervical spine were also generated. COMPARISON:  None FINDINGS: CT HEAD FINDINGS Brain: There is atrophy and chronic small vessel disease changes. No acute intracranial abnormality. Specifically, no hemorrhage, hydrocephalus, mass lesion, acute infarction, or significant intracranial injury. Vascular: No hyperdense vessel or unexpected calcification. Skull: No acute calvarial abnormality. Sinuses/Orbits: Mucosal thickening throughout the paranasal sinuses. Complete opacification of the right maxillary sinus. Other: None CT CERVICAL SPINE FINDINGS Alignment: No subluxation Skull base and vertebrae: No acute fracture. No primary bone lesion or focal pathologic process. Soft tissues and spinal canal: No prevertebral fluid or swelling. No visible canal hematoma. Disc levels: Diffuse degenerative disc disease with disc space narrowing and spurring. Diffuse bilateral degenerative facet disease. Upper chest: No acute findings Other: Bilateral carotid bulb calcifications. IMPRESSION: Atrophy, chronic small vessel disease. Diffuse degenerative disc and facet disease throughout the cervical spine. No acute bony abnormality. Electronically Signed   By: Rolm Baptise M.D.   On: 12/23/2017 21:30   Mr [name] Head Wo Contrast  Result Date: 12/24/2017 CLINICAL DATA:  84 year old female found down yesterday. Confusion. Ataxia. EXAM: MRI HEAD WITHOUT CONTRAST MRA HEAD WITHOUT CONTRAST TECHNIQUE: Multiplanar, multiecho pulse sequences of the brain and surrounding structures were obtained without intravenous contrast. Angiographic images of the head were obtained using MRA technique without contrast. COMPARISON:  Head and cervical spine CT 12/23/2017. FINDINGS: MRI HEAD FINDINGS Brain: No restricted diffusion or evidence of acute infarction. Small chronic infarcts in both cerebellar hemispheres. Confluent bilateral cerebral white matter T2 and FLAIR  hyperintensity with moderate similar T2 heterogeneity throughout the deep gray matter nuclei. Cortical encephalomalacia. No chronic cerebral blood products. No midline shift, mass effect, evidence of mass lesion, ventriculomegaly, extra-axial collection or acute intracranial hemorrhage. Cervicomedullary junction and pituitary are within normal limits. Vascular: Major intracranial vascular flow voids are preserved. Skull and upper cervical spine: Negative visible cervical spine. Normal bone marrow signal. Sinuses/Orbits: Normal orbits soft tissues. Opacified right maxillary sinus with mucoperiosteal thickening. Scattered sphenoid and right frontal sinus opacification as demonstrated by CT. Other: Mastoid air cells are clear. Grossly normal visible internal auditory structures. Scalp and face soft tissues appear negative. MRA HEAD FINDINGS Antegrade flow in the posterior circulation with codominant distal vertebral arteries. No distal vertebral stenosis. PICA origins are patent. Patent basilar artery without stenosis. AICA and SCA origins are normal. Fetal type bilateral PCA origins, more so the right. Bilateral PCA branches are within normal limits. Antegrade flow in both ICA siphons. No siphon stenosis. Normal ophthalmic and posterior communicating artery origins. Hypoplastic right ICA terminus with azygos ACA anatomy supplied from the left ICA. Normal left ACA origin. Visible ACA branches are within normal limits. Normal MCA origins. Left MCA M1, trifurcation and visible left MCA branches are within normal limits. Right MCA M1, bifurcation, and visible right MCA branches are within normal limits. IMPRESSION: 1.  No acute intracranial abnormality. 2. Confluent signal changes in the cerebral white matter and deep gray matter with small  superimposed chronic cerebellar infarcts compatible with moderately advanced chronic small vessel disease. 3. Negative intracranial MRA; normal anatomic variants including azygos ACA,  hypoplastic right ICA terminus, and fetal type bilateral PCA origins. Electronically Signed   By: Genevie Ann M.D.   On: 12/24/2017 07:28   Mr Brain Wo Contrast  Result Date: 12/24/2017 CLINICAL DATA:  84 year old female found down yesterday. Confusion. Ataxia. EXAM: MRI HEAD WITHOUT CONTRAST MRA HEAD WITHOUT CONTRAST TECHNIQUE: Multiplanar, multiecho pulse sequences of the brain and surrounding structures were obtained without intravenous contrast. Angiographic images of the head were obtained using MRA technique without contrast. COMPARISON:  Head and cervical spine CT 12/23/2017. FINDINGS: MRI HEAD FINDINGS Brain: No restricted diffusion or evidence of acute infarction. Small chronic infarcts in both cerebellar hemispheres. Confluent bilateral cerebral white matter T2 and FLAIR hyperintensity with moderate similar T2 heterogeneity throughout the deep gray matter nuclei. Cortical encephalomalacia. No chronic cerebral blood products. No midline shift, mass effect, evidence of mass lesion, ventriculomegaly, extra-axial collection or acute intracranial hemorrhage. Cervicomedullary junction and pituitary are within normal limits. Vascular: Major intracranial vascular flow voids are preserved. Skull and upper cervical spine: Negative visible cervical spine. Normal bone marrow signal. Sinuses/Orbits: Normal orbits soft tissues. Opacified right maxillary sinus with mucoperiosteal thickening. Scattered sphenoid and right frontal sinus opacification as demonstrated by CT. Other: Mastoid air cells are clear. Grossly normal visible internal auditory structures. Scalp and face soft tissues appear negative. MRA HEAD FINDINGS Antegrade flow in the posterior circulation with codominant distal vertebral arteries. No distal vertebral stenosis. PICA origins are patent. Patent basilar artery without stenosis. AICA and SCA origins are normal. Fetal type bilateral PCA origins, more so the right. Bilateral PCA branches are within  normal limits. Antegrade flow in both ICA siphons. No siphon stenosis. Normal ophthalmic and posterior communicating artery origins. Hypoplastic right ICA terminus with azygos ACA anatomy supplied from the left ICA. Normal left ACA origin. Visible ACA branches are within normal limits. Normal MCA origins. Left MCA M1, trifurcation and visible left MCA branches are within normal limits. Right MCA M1, bifurcation, and visible right MCA branches are within normal limits. IMPRESSION: 1.  No acute intracranial abnormality. 2. Confluent signal changes in the cerebral white matter and deep gray matter with small superimposed chronic cerebellar infarcts compatible with moderately advanced chronic small vessel disease. 3. Negative intracranial MRA; normal anatomic variants including azygos ACA, hypoplastic right ICA terminus, and fetal type bilateral PCA origins. Electronically Signed   By: Genevie Ann M.D.   On: 12/24/2017 07:28   Dg Hip Unilat W Or W/o Pelvis 2-3 Views Left  Result Date: 12/23/2017 CLINICAL DATA:  Fall, contusion EXAM: DG HIP (WITH OR WITHOUT PELVIS) 2-3V LEFT COMPARISON:  None. FINDINGS: Pubic symphysis and rami are intact. Prior right hip replacement with normal alignment. Severe arthritis of the left hip with obliteration of joint space, subarticular sclerosis and cyst formation. The left femoral neck is poorly evaluated due to bony summation. IMPRESSION: 1. Advanced arthritis of the left hip. Limited evaluation of left femoral neck due to bony superimposition. 2. Prior right hip replacement without acute abnormality. Electronically Signed   By: Donavan Foil M.D.   On: 12/23/2017 21:11    Scheduled Meds: . amLODipine  10 mg Oral Daily  . cycloSPORINE  1 drop Both Eyes BID  . darifenacin  7.5 mg Oral Daily  . docusate sodium  100 mg Oral BID  . enoxaparin (LOVENOX) injection  40 mg Subcutaneous QHS  . ferrous sulfate  325 mg Oral Q breakfast  . hydrochlorothiazide  12.5 mg Oral BID  .  lisinopril  20 mg Oral BID  . loratadine  10 mg Oral Daily  . magnesium oxide  400 mg Oral Daily  . mometasone-formoterol  2 puff Inhalation BID  . montelukast  10 mg Oral QHS  . pantoprazole  40 mg Oral Daily  . potassium chloride SA  20 mEq Oral BID  . simvastatin  20 mg Oral QHS    Continuous Infusions: . sodium chloride 100 mL/hr at 12/24/17 0600     LOS: 1 day     Desiree Hane, MD [facility] Pager [phone]  If 7PM-7AM, please contact night-coverage [email or web address] Password [facility] 12/24/2017, 10:23 AM

## 2017-12-25 ENCOUNTER — Other Ambulatory Visit: Payer: Self-pay | Admitting: Cardiology

## 2017-12-25 DIAGNOSIS — R55 Syncope and collapse: Secondary | ICD-10-CM

## 2017-12-25 DIAGNOSIS — E876 Hypokalemia: Secondary | ICD-10-CM

## 2017-12-25 DIAGNOSIS — I5032 Chronic diastolic (congestive) heart failure: Secondary | ICD-10-CM

## 2017-12-25 DIAGNOSIS — R748 Abnormal levels of other serum enzymes: Secondary | ICD-10-CM

## 2017-12-25 LAB — BASIC METABOLIC PANEL
Anion gap: 10 (ref 5–15)
BUN: 13 mg/dL (ref 8–23)
CO2: 24 mmol/L (ref 22–32)
Calcium: 8 mg/dL — ABNORMAL LOW (ref 8.9–10.3)
Chloride: 107 mmol/L (ref 98–111)
Creatinine, Ser: 0.48 mg/dL (ref 0.44–1.00)
GFR calc Af Amer: >60 mL/min (ref >60)
GFR calc non Af Amer: >60 mL/min (ref >60)
Glucose, Bld: 82 mg/dL (ref 70–99)
Potassium: 3.4 mmol/L — ABNORMAL LOW (ref 3.5–5.1)
Sodium: 141 mmol/L (ref 135–145)

## 2017-12-25 LAB — CBC
HCT: 31 % — ABNORMAL LOW (ref 36.0–46.0)
Hemoglobin: 9.4 g/dL — ABNORMAL LOW (ref 12.0–15.0)
MCH: 24.9 pg — ABNORMAL LOW (ref 26.0–34.0)
MCHC: 30.3 g/dL (ref 30.0–36.0)
MCV: 82 fL (ref 80.0–100.0)
Platelets: 298 K/uL (ref 150–400)
RBC: 3.78 MIL/uL — ABNORMAL LOW (ref 3.87–5.11)
RDW: 15.9 % — ABNORMAL HIGH (ref 11.5–15.5)
WBC: 9.3 K/uL (ref 4.0–10.5)
nRBC: 0 % (ref 0.0–0.2)

## 2017-12-25 LAB — CK: Total CK: 531 U/L — ABNORMAL HIGH (ref 38–234)

## 2017-12-25 NOTE — Discharge Summary (Addendum)
Physician Discharge Summary  [name] NTI:[id number] DOB: [birth year] DOA: 12/23/2017  PCP: Burnis Medin, MD  Admit date: 12/23/2017 Discharge date: 12/25/2017  Admitted From: home Disposition:  Home  Recommendations for Outpatient Follow-up:  1. Follow up with Cardiology in 1-2 weeks for Holter monitor as an outpatient. 2. Please obtain BMP/CBC in one week   Home Health:Yes Equipment/Devices:none  Discharge Condition:stable CODE STATUS:full Diet recommendation: Heart Healthy   Brief/Interim Summary: 84 year old with past medical history significant for hypertension breast cancer who lives independent presents on 12/23/2017 for an unwitnessed mechanical fall.  Discharge Diagnoses:  Principal Problem:   Fall Active Problems:   Essential hypertension   GERD   Malignant neoplasm of female breast ([place])   Hypokalemia   Rhabdomyolysis   Leucocytosis   Leukocytosis   Chronic diastolic CHF (congestive heart failure) (HCC)  Unwitnessed mechanical fall: Cardiac biomarkers were negative, an MRI of the brain was negative for acute stroke, chest x-ray and CT scan of the spine showed no acute fracture EKG shows no acute findings she had no events on telemetry. Denies any current loss of consciousness. Infection is unlikely due to a normal UA and no findings on chest x-ray. There have been no changes in her medications no new medication no electrolyte abnormality her kidney and liver functions were normal orthostatics have been negative, her TSH was 0 point two 2D echo was done that showed no left ear and a grade 1 diastolic heart failure. We have made a follow-up appointment with cardiology with a Holter monitor as an outpatient.  Non-traumaticRhabdomyolysis: Likely due to immobility this resolved with IV fluid hydration.  Leukocytosis: Likely stress demargination she remained afebrile work-up so far has been negative and her leukocytosis is slowly improving.  Urinary  retention: Has now resolved.  Essential hypertension: No changes were made to her medication.  Normocytic anemia: Her hemoglobin has remained stable no signs of overt bleeding.   Discharge Instructions  Discharge Instructions    Diet - low sodium heart healthy   Complete by:  As directed    Increase activity slowly   Complete by:  As directed      Allergies as of 12/25/2017   No Known Allergies     Medication List    TAKE these medications   ALLERGY EYE DROPS OP Apply 1 drop to eye daily.   amLODipine 10 MG tablet Commonly known as:  NORVASC TAKE ONE TABLET BY MOUTH DAILY   budesonide-formoterol 160-4.5 MCG/ACT inhaler Commonly known as:  SYMBICORT Inhale 2 puffs into the lungs 2 (two) times daily as needed (Shortness of breath).   cycloSPORINE 0.05 % ophthalmic emulsion Commonly known as:  RESTASIS Place 1 drop into both eyes 2 (two) times daily.   EPIPEN 2-PAK 0.3 mg/0.3 mL Soaj injection Generic drug:  EPINEPHrine Inject 0.3 mg into the muscle once.   fexofenadine 180 MG tablet Commonly known as:  ALLEGRA Take 180 mg by mouth daily.   KLOR-CON M20 20 MEQ tablet Generic drug:  potassium chloride SA TAKE 1 TABLET TWICE A DAY   lisinopril-hydrochlorothiazide 20-12.5 MG tablet Commonly known as:  PRINZIDE,ZESTORETIC TAKE ONE TABLET BY MOUTH TWO TIMES A DAY   omeprazole 20 MG capsule Commonly known as:  PRILOSEC Take 1 capsule (20 mg total) by mouth daily as needed (Acid Reflux).   simvastatin 20 MG tablet Commonly known as:  ZOCOR Take 1 tablet (20 mg total) by mouth at bedtime.   solifenacin 5 MG tablet Commonly known as:  VESICARE Take 1 tablet (5 mg total) by mouth daily.       No Known Allergies   Consultations:none   Procedures/Studies: Dg Shoulder Right  Result Date: 12/23/2017 CLINICAL DATA:  Fall, contusion EXAM: RIGHT SHOULDER - 2+ VIEW COMPARISON:  None. FINDINGS: Moderate AC joint degenerative change. Mild to moderate  glenohumeral degenerative change. No fracture or dislocation. IMPRESSION: No acute osseous abnormality. Electronically Signed   By: Donavan Foil M.D.   On: 12/23/2017 21:10   Ct Head Wo Contrast  Result Date: 12/23/2017 CLINICAL DATA:  Found down.  Left side pain EXAM: CT HEAD WITHOUT CONTRAST CT CERVICAL SPINE WITHOUT CONTRAST TECHNIQUE: Multidetector CT imaging of the head and cervical spine was performed following the standard protocol without intravenous contrast. Multiplanar CT image reconstructions of the cervical spine were also generated. COMPARISON:  None FINDINGS: CT HEAD FINDINGS Brain: There is atrophy and chronic small vessel disease changes. No acute intracranial abnormality. Specifically, no hemorrhage, hydrocephalus, mass lesion, acute infarction, or significant intracranial injury. Vascular: No hyperdense vessel or unexpected calcification. Skull: No acute calvarial abnormality. Sinuses/Orbits: Mucosal thickening throughout the paranasal sinuses. Complete opacification of the right maxillary sinus. Other: None CT CERVICAL SPINE FINDINGS Alignment: No subluxation Skull base and vertebrae: No acute fracture. No primary bone lesion or focal pathologic process. Soft tissues and spinal canal: No prevertebral fluid or swelling. No visible canal hematoma. Disc levels: Diffuse degenerative disc disease with disc space narrowing and spurring. Diffuse bilateral degenerative facet disease. Upper chest: No acute findings Other: Bilateral carotid bulb calcifications. IMPRESSION: Atrophy, chronic small vessel disease. Diffuse degenerative disc and facet disease throughout the cervical spine. No acute bony abnormality. Electronically Signed   By: Rolm Baptise M.D.   On: 12/23/2017 21:30   Ct Cervical Spine Wo Contrast  Result Date: 12/23/2017 CLINICAL DATA:  Found down.  Left side pain EXAM: CT HEAD WITHOUT CONTRAST CT CERVICAL SPINE WITHOUT CONTRAST TECHNIQUE: Multidetector CT imaging of the head and  cervical spine was performed following the standard protocol without intravenous contrast. Multiplanar CT image reconstructions of the cervical spine were also generated. COMPARISON:  None FINDINGS: CT HEAD FINDINGS Brain: There is atrophy and chronic small vessel disease changes. No acute intracranial abnormality. Specifically, no hemorrhage, hydrocephalus, mass lesion, acute infarction, or significant intracranial injury. Vascular: No hyperdense vessel or unexpected calcification. Skull: No acute calvarial abnormality. Sinuses/Orbits: Mucosal thickening throughout the paranasal sinuses. Complete opacification of the right maxillary sinus. Other: None CT CERVICAL SPINE FINDINGS Alignment: No subluxation Skull base and vertebrae: No acute fracture. No primary bone lesion or focal pathologic process. Soft tissues and spinal canal: No prevertebral fluid or swelling. No visible canal hematoma. Disc levels: Diffuse degenerative disc disease with disc space narrowing and spurring. Diffuse bilateral degenerative facet disease. Upper chest: No acute findings Other: Bilateral carotid bulb calcifications. IMPRESSION: Atrophy, chronic small vessel disease. Diffuse degenerative disc and facet disease throughout the cervical spine. No acute bony abnormality. Electronically Signed   By: Rolm Baptise M.D.   On: 12/23/2017 21:30   Mr [name] Head Wo Contrast  Result Date: 12/24/2017 CLINICAL DATA:  84 year old female found down yesterday. Confusion. Ataxia. EXAM: MRI HEAD WITHOUT CONTRAST MRA HEAD WITHOUT CONTRAST TECHNIQUE: Multiplanar, multiecho pulse sequences of the brain and surrounding structures were obtained without intravenous contrast. Angiographic images of the head were obtained using MRA technique without contrast. COMPARISON:  Head and cervical spine CT 12/23/2017. FINDINGS: MRI HEAD FINDINGS Brain: No restricted diffusion or evidence of acute infarction. Small chronic infarcts  in both cerebellar hemispheres.  Confluent bilateral cerebral white matter T2 and FLAIR hyperintensity with moderate similar T2 heterogeneity throughout the deep gray matter nuclei. Cortical encephalomalacia. No chronic cerebral blood products. No midline shift, mass effect, evidence of mass lesion, ventriculomegaly, extra-axial collection or acute intracranial hemorrhage. Cervicomedullary junction and pituitary are within normal limits. Vascular: Major intracranial vascular flow voids are preserved. Skull and upper cervical spine: Negative visible cervical spine. Normal bone marrow signal. Sinuses/Orbits: Normal orbits soft tissues. Opacified right maxillary sinus with mucoperiosteal thickening. Scattered sphenoid and right frontal sinus opacification as demonstrated by CT. Other: Mastoid air cells are clear. Grossly normal visible internal auditory structures. Scalp and face soft tissues appear negative. MRA HEAD FINDINGS Antegrade flow in the posterior circulation with codominant distal vertebral arteries. No distal vertebral stenosis. PICA origins are patent. Patent basilar artery without stenosis. AICA and SCA origins are normal. Fetal type bilateral PCA origins, more so the right. Bilateral PCA branches are within normal limits. Antegrade flow in both ICA siphons. No siphon stenosis. Normal ophthalmic and posterior communicating artery origins. Hypoplastic right ICA terminus with azygos ACA anatomy supplied from the left ICA. Normal left ACA origin. Visible ACA branches are within normal limits. Normal MCA origins. Left MCA M1, trifurcation and visible left MCA branches are within normal limits. Right MCA M1, bifurcation, and visible right MCA branches are within normal limits. IMPRESSION: 1.  No acute intracranial abnormality. 2. Confluent signal changes in the cerebral white matter and deep gray matter with small superimposed chronic cerebellar infarcts compatible with moderately advanced chronic small vessel disease. 3. Negative  intracranial MRA; normal anatomic variants including azygos ACA, hypoplastic right ICA terminus, and fetal type bilateral PCA origins. Electronically Signed   By: Genevie Ann M.D.   On: 12/24/2017 07:28   Mr Brain Wo Contrast  Result Date: 12/24/2017 CLINICAL DATA:  84 year old female found down yesterday. Confusion. Ataxia. EXAM: MRI HEAD WITHOUT CONTRAST MRA HEAD WITHOUT CONTRAST TECHNIQUE: Multiplanar, multiecho pulse sequences of the brain and surrounding structures were obtained without intravenous contrast. Angiographic images of the head were obtained using MRA technique without contrast. COMPARISON:  Head and cervical spine CT 12/23/2017. FINDINGS: MRI HEAD FINDINGS Brain: No restricted diffusion or evidence of acute infarction. Small chronic infarcts in both cerebellar hemispheres. Confluent bilateral cerebral white matter T2 and FLAIR hyperintensity with moderate similar T2 heterogeneity throughout the deep gray matter nuclei. Cortical encephalomalacia. No chronic cerebral blood products. No midline shift, mass effect, evidence of mass lesion, ventriculomegaly, extra-axial collection or acute intracranial hemorrhage. Cervicomedullary junction and pituitary are within normal limits. Vascular: Major intracranial vascular flow voids are preserved. Skull and upper cervical spine: Negative visible cervical spine. Normal bone marrow signal. Sinuses/Orbits: Normal orbits soft tissues. Opacified right maxillary sinus with mucoperiosteal thickening. Scattered sphenoid and right frontal sinus opacification as demonstrated by CT. Other: Mastoid air cells are clear. Grossly normal visible internal auditory structures. Scalp and face soft tissues appear negative. MRA HEAD FINDINGS Antegrade flow in the posterior circulation with codominant distal vertebral arteries. No distal vertebral stenosis. PICA origins are patent. Patent basilar artery without stenosis. AICA and SCA origins are normal. Fetal type bilateral PCA  origins, more so the right. Bilateral PCA branches are within normal limits. Antegrade flow in both ICA siphons. No siphon stenosis. Normal ophthalmic and posterior communicating artery origins. Hypoplastic right ICA terminus with azygos ACA anatomy supplied from the left ICA. Normal left ACA origin. Visible ACA branches are within normal limits. Normal MCA origins. Left MCA  M1, trifurcation and visible left MCA branches are within normal limits. Right MCA M1, bifurcation, and visible right MCA branches are within normal limits. IMPRESSION: 1.  No acute intracranial abnormality. 2. Confluent signal changes in the cerebral white matter and deep gray matter with small superimposed chronic cerebellar infarcts compatible with moderately advanced chronic small vessel disease. 3. Negative intracranial MRA; normal anatomic variants including azygos ACA, hypoplastic right ICA terminus, and fetal type bilateral PCA origins. Electronically Signed   By: Genevie Ann M.D.   On: 12/24/2017 07:28   Dg Hip Unilat W Or W/o Pelvis 2-3 Views Left  Result Date: 12/23/2017 CLINICAL DATA:  Fall, contusion EXAM: DG HIP (WITH OR WITHOUT PELVIS) 2-3V LEFT COMPARISON:  None. FINDINGS: Pubic symphysis and rami are intact. Prior right hip replacement with normal alignment. Severe arthritis of the left hip with obliteration of joint space, subarticular sclerosis and cyst formation. The left femoral neck is poorly evaluated due to bony summation. IMPRESSION: 1. Advanced arthritis of the left hip. Limited evaluation of left femoral neck due to bony superimposition. 2. Prior right hip replacement without acute abnormality. Electronically Signed   By: Donavan Foil M.D.   On: 12/23/2017 21:11      Subjective: No complaints feels great.  Discharge Exam: Vitals:   12/25/17 1022 12/25/17 1409  BP: 123/60 (!) 109/97  Pulse: 74 85  Resp:  14  Temp:  98.4 F (36.9 C)  SpO2:  99%   Vitals:   12/25/17 0444 12/25/17 0826 12/25/17 1022  12/25/17 1409  BP: 120/64  123/60 (!) 109/97  Pulse: 70  74 85  Resp: 18   14  Temp: 98.3 F (36.8 C)   98.4 F (36.9 C)  TempSrc: Oral   Oral  SpO2: 98% 92%  99%    General: Pt is alert, awake, not in acute distress Cardiovascular: RRR, S1/S2 +, no rubs, no gallops Respiratory: CTA bilaterally, no wheezing, no rhonchi Abdominal: Soft, NT, ND, bowel sounds + Extremities: no edema, no cyanosis    The results of significant diagnostics from this hospitalization (including imaging, microbiology, ancillary and laboratory) are listed below for reference.     Microbiology: No results found for this or any previous visit (from the past 240 hour(s)).   Labs: BNP (last 3 results) No results for input(s): BNP in the last 8760 hours. Basic Metabolic Panel: Recent Labs  Lab 12/23/17 1948 12/24/17 0548 12/25/17 0614  NA 138 142 141  K 5.1 3.0* 3.4*  CL 104 111 107  CO2 20* 23 24  GLUCOSE 106* 78 82  BUN 26* 22 13  CREATININE 0.88 0.59 0.48  CALCIUM 8.8* 8.1* 8.0*   Liver Function Tests: Recent Labs  Lab 12/24/17 0548  AST 55*  ALT 26  ALKPHOS 52  BILITOT 0.9  PROT 5.7*  ALBUMIN 2.8*   No results for input(s): LIPASE, AMYLASE in the last 168 hours. No results for input(s): AMMONIA in the last 168 hours. CBC: Recent Labs  Lab 12/23/17 2101 12/24/17 0548 12/25/17 0614  WBC 17.8* 15.7* 9.3  NEUTROABS 15.3*  --   --   HGB 11.9* 9.9* 9.4*  HCT 38.9 32.5* 31.0*  MCV 81.0 82.3 82.0  PLT 355 322 298   Cardiac Enzymes: Recent Labs  Lab 12/23/17 1948 12/24/17 0548 12/25/17 0614  CKTOTAL 1,886* 1,171* 531*   BNP: Invalid input(s): POCBNP CBG: No results for input(s): GLUCAP in the last 168 hours. D-Dimer No results for input(s): DDIMER in the last 72  hours. Hgb A1c No results for input(s): HGBA1C in the last 72 hours. Lipid Profile No results for input(s): CHOL, HDL, LDLCALC, TRIG, CHOLHDL, LDLDIRECT in the last 72 hours. Thyroid function studies Recent  Labs    12/24/17 0951  TSH 0.214*   Anemia work up No results for input(s): VITAMINB12, FOLATE, FERRITIN, TIBC, IRON, RETICCTPCT in the last 72 hours. Urinalysis    Component Value Date/Time   COLORURINE YELLOW 12/23/2017 1948   APPEARANCEUR CLEAR 12/23/2017 1948   LABSPEC 1.019 12/23/2017 1948   PHURINE 7.0 12/23/2017 1948   GLUCOSEU NEGATIVE 12/23/2017 1948   GLUCOSEU NEGATIVE 09/14/2014 1706   HGBUR NEGATIVE 12/23/2017 1948   HGBUR 1+ 01/30/2010 1130   BILIRUBINUR NEGATIVE 12/23/2017 1948   BILIRUBINUR n 10/18/2017 1003   KETONESUR 80 (A) 12/23/2017 1948   PROTEINUR 30 (A) 12/23/2017 1948   UROBILINOGEN 0.2 10/18/2017 1003   UROBILINOGEN 0.2 09/14/2014 1706   NITRITE NEGATIVE 12/23/2017 1948   LEUKOCYTESUR NEGATIVE 12/23/2017 1948   Sepsis Labs Invalid input(s): PROCALCITONIN,  WBC,  LACTICIDVEN Microbiology No results found for this or any previous visit (from the past 240 hour(s)).   Time coordinating discharge:40 minutes  SIGNED:   Charlynne Cousins, MD  [facility] 12/25/2017, 7:40 PM Pager   If 7PM-7AM, please contact night-coverage [email or web address] Password [email or web address]

## 2017-12-25 NOTE — Evaluation (Signed)
Physical Therapy Evaluation Patient Details Name: [name] MRN: [id number] DOB: August 15, 1930 Today's Date: 12/25/2017   History of Present Illness  [name] is a 84 y.o. year old female with medical history significant for HTN, HLD, GERD, history of breast cancer at home independently PTA who presented on 12/23/2017 with unwitnessed fall.   Clinical Impression  Pt admitted with above diagnosis. Pt currently with functional limitations due to the deficits listed below (see PT Problem List). Min assist for sit to stand, pt ambulated 5' with RW (distance limited by L hip/thigh pain). Pt's 2 daughters are able to provide 24* care at home. HHPT recommended.  Pt will benefit from skilled PT to increase their independence and safety with mobility to allow discharge to the venue listed below.       Follow Up Recommendations Home health PT;Supervision/Assistance - 24 hour;Supervision for mobility/OOB    Equipment Recommendations  None recommended by PT(family declined WC)    Recommendations for Other Services       Precautions / Restrictions Precautions Precautions: Fall Precaution Comments: pt/family deny h/o other falls in past year Restrictions Weight Bearing Restrictions: No      Mobility  Bed Mobility               General bed mobility comments: up in recliner  Transfers Overall transfer level: Needs assistance Equipment used: Rolling walker (2 wheeled) Transfers: Sit to/from Stand Sit to Stand: Min assist         General transfer comment: cues for hand placement, increased time 2* pain  Ambulation/Gait Ambulation/Gait assistance: Min guard Gait Distance (Feet): 5 Feet Assistive device: Rolling walker (2 wheeled) Gait Pattern/deviations: Step-to pattern;Decreased stride length;Decreased weight shift to left;Antalgic Gait velocity: decr   General Gait Details: distance limited by LLE pain, pain meds requested  Stairs            Wheelchair  Mobility    Modified Rankin (Stroke Patients Only)       Balance Overall balance assessment: Needs assistance Sitting-balance support: Bilateral upper extremity supported Sitting balance-Leahy Scale: Fair     Standing balance support: Bilateral upper extremity supported Standing balance-Leahy Scale: Poor                               Pertinent Vitals/Pain Pain Score: 8  Pain Location: L lateral thigh and L shoulder Pain Descriptors / Indicators: Sore Pain Intervention(s): Limited activity within patient's tolerance;Monitored during session;Patient requesting pain meds-RN notified;RN gave pain meds during session    Home Living Family/patient expects to be discharged to:: Private residence Living Arrangements: Alone(lives alone, but 2 daughters able to provide 24* assistance) Available Help at Discharge: Family;Available 24 hours/day Type of Home: Apartment Home Access: Level entry     Home Layout: One level Home Equipment: Cane - single point;Walker - 2 wheels;Toilet riser;Grab bars - tub/shower;Bedside commode;Walker - 4 wheels      Prior Function Level of Independence: Independent with assistive device(s)         Comments: pt used cane to ambulate in/out of apt. She was cooking own meals and managing own laundry and running errands.      Hand Dominance   Dominant Hand: Left    Extremity/Trunk Assessment   Upper Extremity Assessment Upper Extremity Assessment: Defer to OT evaluation LUE Deficits / Details: Pt complaining of soreness/pain in L shoulder. Pt currently only able to minimally raise L shoulder off the pillow. Elbow,  wrist and hand WFL. Informed nursing of L shoulder pain.  Pt stated she noticed the weakness with trying to feed herself as she is L handed.     Lower Extremity Assessment Lower Extremity Assessment: LLE deficits/detail LLE Deficits / Details: knee ext 3/5 (limited by pain), L hip +2/5 (limited by pain), hip AAROM decr ~50%  2* pain LLE Sensation: WNL       Communication   Communication: No difficulties  Cognition Arousal/Alertness: Awake/alert Behavior During Therapy: WFL for tasks assessed/performed Overall Cognitive Status: Within Functional Limits for tasks assessed                                        General Comments      Exercises General Exercises - Upper Extremity Shoulder Flexion: Self ROM;Left;5 reps;Seated General Exercises - Lower Extremity Ankle Circles/Pumps: AROM;Both;10 reps;Supine Quad Sets: AROM;Both;5 reps;Supine Gluteal Sets: AROM;Both;5 reps;Supine   Assessment/Plan    PT Assessment Patient needs continued PT services  PT Problem List Decreased strength;Decreased activity tolerance;Decreased range of motion;Decreased mobility;Decreased balance;Pain       PT Treatment Interventions Gait training;DME instruction;Functional mobility training;Therapeutic activities;Therapeutic exercise;Patient/family education    PT Goals (Current goals can be found in the Care Plan section)  Acute Rehab PT Goals Patient Stated Goal: to move better PT Goal Formulation: With patient/family Time For Goal Achievement: 01/08/18 Potential to Achieve Goals: Good    Frequency Min 3X/week   Barriers to discharge        Co-evaluation               AM-PAC PT "6 Clicks" Daily Activity  Outcome Measure Difficulty turning over in bed (including adjusting bedclothes, sheets and blankets)?: A Lot Difficulty moving from lying on back to sitting on the side of the bed? : Unable Difficulty sitting down on and standing up from a chair with arms (e.g., wheelchair, bedside commode, etc,.)?: Unable Help needed moving to and from a bed to chair (including a wheelchair)?: A Little Help needed walking in hospital room?: A Little Help needed climbing 3-5 steps with a railing? : A Lot 6 Click Score: 12    End of Session Equipment Utilized During Treatment: Gait belt Activity  Tolerance: Patient limited by pain Patient left: in chair;with call bell/phone within reach Nurse Communication: Mobility status;Patient requests pain meds PT Visit Diagnosis: Unsteadiness on feet (R26.81);History of falling (Z91.81);Muscle weakness (generalized) (M62.81);Difficulty in walking, not elsewhere classified (R26.2);Pain Pain - Right/Left: Left Pain - part of body: Hip    Time: 5643-3295 PT Time Calculation (min) (ACUTE ONLY): 32 min   Charges:   PT Evaluation $PT Eval Moderate Complexity: 1 Mod PT Treatments $Therapeutic Activity: 8-22 mins        Blondell Reveal Kistler PT 12/25/2017  Acute Rehabilitation Services Pager [phone] Office [phone]

## 2017-12-25 NOTE — Progress Notes (Signed)
[facility] PROGRESS NOTE    Progress Note  [name]  KGM:[id number] DOB: March 19, 1930 DOA: 12/23/2017 PCP: Burnis Medin, MD     Brief Narrative:   [name] is an 84 y.o. female past medical history significant for hypertension, breast cancer who lives independently presents on 12/23/2017 for an unwitnessed fall  Assessment/Plan:   Unwitnessed mechanical fall: Syncope work-up has been negative. MRI is negative for acute stroke. X-ray and CT imaging of the spine showed no acute fractures, EKG showed no acute findings. Infection unlikely due to normal UA and no findings on chest x-ray. Been on no new medications, no electrolyte abnormalities, her kidney function is normal. Orthostatics vitals have been negative. TSH 0.2 am awaiting physical therapy evaluation. 2D echo showed preserved ejection fraction with grade 1 diastolic heart failure no aortic stenosis.  Rhabdomyolysis: Likely due to laying on the floor she was started on IV fluids, today's 500 we will KVO IV fluids.  Is remained stable.  Leukocytosis: Likely due to stress demargination.  She has remained afebrile work-up so far has been negative.  Urinary retention: Has been resolved.  Essential hypertension: Resume amlodipine, hydrochlorothiazide and lisinopril.  Normocytic anemia: Hemoglobin has remained stable no signs of overt bleeding.     DVT prophylaxis: lovexno Family Communication:daughter Disposition Plan/Barrier to D/C: 1 or 2 days Code Status:     Code Status Orders  (From admission, onward)         Start     Ordered   12/24/17 0000  Full code  Continuous     12/23/17 2359        Code Status History    This patient has a current code status but no historical code status.        IV Access:    Peripheral IV   Procedures and diagnostic studies:   Dg Shoulder Right  Result Date: 12/23/2017 CLINICAL DATA:  Fall, contusion EXAM: RIGHT SHOULDER - 2+ VIEW  COMPARISON:  None. FINDINGS: Moderate AC joint degenerative change. Mild to moderate glenohumeral degenerative change. No fracture or dislocation. IMPRESSION: No acute osseous abnormality. Electronically Signed   By: Donavan Foil M.D.   On: 12/23/2017 21:10   Ct Head Wo Contrast  Result Date: 12/23/2017 CLINICAL DATA:  Found down.  Left side pain EXAM: CT HEAD WITHOUT CONTRAST CT CERVICAL SPINE WITHOUT CONTRAST TECHNIQUE: Multidetector CT imaging of the head and cervical spine was performed following the standard protocol without intravenous contrast. Multiplanar CT image reconstructions of the cervical spine were also generated. COMPARISON:  None FINDINGS: CT HEAD FINDINGS Brain: There is atrophy and chronic small vessel disease changes. No acute intracranial abnormality. Specifically, no hemorrhage, hydrocephalus, mass lesion, acute infarction, or significant intracranial injury. Vascular: No hyperdense vessel or unexpected calcification. Skull: No acute calvarial abnormality. Sinuses/Orbits: Mucosal thickening throughout the paranasal sinuses. Complete opacification of the right maxillary sinus. Other: None CT CERVICAL SPINE FINDINGS Alignment: No subluxation Skull base and vertebrae: No acute fracture. No primary bone lesion or focal pathologic process. Soft tissues and spinal canal: No prevertebral fluid or swelling. No visible canal hematoma. Disc levels: Diffuse degenerative disc disease with disc space narrowing and spurring. Diffuse bilateral degenerative facet disease. Upper chest: No acute findings Other: Bilateral carotid bulb calcifications. IMPRESSION: Atrophy, chronic small vessel disease. Diffuse degenerative disc and facet disease throughout the cervical spine. No acute bony abnormality. Electronically Signed   By: Rolm Baptise M.D.   On: 12/23/2017 21:30   Ct Cervical Spine Wo  Contrast  Result Date: 12/23/2017 CLINICAL DATA:  Found down.  Left side pain EXAM: CT HEAD WITHOUT CONTRAST CT  CERVICAL SPINE WITHOUT CONTRAST TECHNIQUE: Multidetector CT imaging of the head and cervical spine was performed following the standard protocol without intravenous contrast. Multiplanar CT image reconstructions of the cervical spine were also generated. COMPARISON:  None FINDINGS: CT HEAD FINDINGS Brain: There is atrophy and chronic small vessel disease changes. No acute intracranial abnormality. Specifically, no hemorrhage, hydrocephalus, mass lesion, acute infarction, or significant intracranial injury. Vascular: No hyperdense vessel or unexpected calcification. Skull: No acute calvarial abnormality. Sinuses/Orbits: Mucosal thickening throughout the paranasal sinuses. Complete opacification of the right maxillary sinus. Other: None CT CERVICAL SPINE FINDINGS Alignment: No subluxation Skull base and vertebrae: No acute fracture. No primary bone lesion or focal pathologic process. Soft tissues and spinal canal: No prevertebral fluid or swelling. No visible canal hematoma. Disc levels: Diffuse degenerative disc disease with disc space narrowing and spurring. Diffuse bilateral degenerative facet disease. Upper chest: No acute findings Other: Bilateral carotid bulb calcifications. IMPRESSION: Atrophy, chronic small vessel disease. Diffuse degenerative disc and facet disease throughout the cervical spine. No acute bony abnormality. Electronically Signed   By: Rolm Baptise M.D.   On: 12/23/2017 21:30   Mr [name] Head Wo Contrast  Result Date: 12/24/2017 CLINICAL DATA:  84 year old female found down yesterday. Confusion. Ataxia. EXAM: MRI HEAD WITHOUT CONTRAST MRA HEAD WITHOUT CONTRAST TECHNIQUE: Multiplanar, multiecho pulse sequences of the brain and surrounding structures were obtained without intravenous contrast. Angiographic images of the head were obtained using MRA technique without contrast. COMPARISON:  Head and cervical spine CT 12/23/2017. FINDINGS: MRI HEAD FINDINGS Brain: No restricted diffusion or  evidence of acute infarction. Small chronic infarcts in both cerebellar hemispheres. Confluent bilateral cerebral white matter T2 and FLAIR hyperintensity with moderate similar T2 heterogeneity throughout the deep gray matter nuclei. Cortical encephalomalacia. No chronic cerebral blood products. No midline shift, mass effect, evidence of mass lesion, ventriculomegaly, extra-axial collection or acute intracranial hemorrhage. Cervicomedullary junction and pituitary are within normal limits. Vascular: Major intracranial vascular flow voids are preserved. Skull and upper cervical spine: Negative visible cervical spine. Normal bone marrow signal. Sinuses/Orbits: Normal orbits soft tissues. Opacified right maxillary sinus with mucoperiosteal thickening. Scattered sphenoid and right frontal sinus opacification as demonstrated by CT. Other: Mastoid air cells are clear. Grossly normal visible internal auditory structures. Scalp and face soft tissues appear negative. MRA HEAD FINDINGS Antegrade flow in the posterior circulation with codominant distal vertebral arteries. No distal vertebral stenosis. PICA origins are patent. Patent basilar artery without stenosis. AICA and SCA origins are normal. Fetal type bilateral PCA origins, more so the right. Bilateral PCA branches are within normal limits. Antegrade flow in both ICA siphons. No siphon stenosis. Normal ophthalmic and posterior communicating artery origins. Hypoplastic right ICA terminus with azygos ACA anatomy supplied from the left ICA. Normal left ACA origin. Visible ACA branches are within normal limits. Normal MCA origins. Left MCA M1, trifurcation and visible left MCA branches are within normal limits. Right MCA M1, bifurcation, and visible right MCA branches are within normal limits. IMPRESSION: 1.  No acute intracranial abnormality. 2. Confluent signal changes in the cerebral white matter and deep gray matter with small superimposed chronic cerebellar infarcts  compatible with moderately advanced chronic small vessel disease. 3. Negative intracranial MRA; normal anatomic variants including azygos ACA, hypoplastic right ICA terminus, and fetal type bilateral PCA origins. Electronically Signed   By: Genevie Ann M.D.   On: 12/24/2017  07:28   Mr Brain 36 Contrast  Result Date: 12/24/2017 CLINICAL DATA:  84 year old female found down yesterday. Confusion. Ataxia. EXAM: MRI HEAD WITHOUT CONTRAST MRA HEAD WITHOUT CONTRAST TECHNIQUE: Multiplanar, multiecho pulse sequences of the brain and surrounding structures were obtained without intravenous contrast. Angiographic images of the head were obtained using MRA technique without contrast. COMPARISON:  Head and cervical spine CT 12/23/2017. FINDINGS: MRI HEAD FINDINGS Brain: No restricted diffusion or evidence of acute infarction. Small chronic infarcts in both cerebellar hemispheres. Confluent bilateral cerebral white matter T2 and FLAIR hyperintensity with moderate similar T2 heterogeneity throughout the deep gray matter nuclei. Cortical encephalomalacia. No chronic cerebral blood products. No midline shift, mass effect, evidence of mass lesion, ventriculomegaly, extra-axial collection or acute intracranial hemorrhage. Cervicomedullary junction and pituitary are within normal limits. Vascular: Major intracranial vascular flow voids are preserved. Skull and upper cervical spine: Negative visible cervical spine. Normal bone marrow signal. Sinuses/Orbits: Normal orbits soft tissues. Opacified right maxillary sinus with mucoperiosteal thickening. Scattered sphenoid and right frontal sinus opacification as demonstrated by CT. Other: Mastoid air cells are clear. Grossly normal visible internal auditory structures. Scalp and face soft tissues appear negative. MRA HEAD FINDINGS Antegrade flow in the posterior circulation with codominant distal vertebral arteries. No distal vertebral stenosis. PICA origins are patent. Patent basilar artery  without stenosis. AICA and SCA origins are normal. Fetal type bilateral PCA origins, more so the right. Bilateral PCA branches are within normal limits. Antegrade flow in both ICA siphons. No siphon stenosis. Normal ophthalmic and posterior communicating artery origins. Hypoplastic right ICA terminus with azygos ACA anatomy supplied from the left ICA. Normal left ACA origin. Visible ACA branches are within normal limits. Normal MCA origins. Left MCA M1, trifurcation and visible left MCA branches are within normal limits. Right MCA M1, bifurcation, and visible right MCA branches are within normal limits. IMPRESSION: 1.  No acute intracranial abnormality. 2. Confluent signal changes in the cerebral white matter and deep gray matter with small superimposed chronic cerebellar infarcts compatible with moderately advanced chronic small vessel disease. 3. Negative intracranial MRA; normal anatomic variants including azygos ACA, hypoplastic right ICA terminus, and fetal type bilateral PCA origins. Electronically Signed   By: Genevie Ann M.D.   On: 12/24/2017 07:28   Dg Hip Unilat W Or W/o Pelvis 2-3 Views Left  Result Date: 12/23/2017 CLINICAL DATA:  Fall, contusion EXAM: DG HIP (WITH OR WITHOUT PELVIS) 2-3V LEFT COMPARISON:  None. FINDINGS: Pubic symphysis and rami are intact. Prior right hip replacement with normal alignment. Severe arthritis of the left hip with obliteration of joint space, subarticular sclerosis and cyst formation. The left femoral neck is poorly evaluated due to bony summation. IMPRESSION: 1. Advanced arthritis of the left hip. Limited evaluation of left femoral neck due to bony superimposition. 2. Prior right hip replacement without acute abnormality. Electronically Signed   By: Donavan Foil M.D.   On: 12/23/2017 21:11     Medical Consultants:    None.  Anti-Infectives:   None  Subjective:    Paden B Thornberry she relates she feels great no new complaints.  Objective:    Vitals:    12/24/17 2019 12/25/17 0444 12/25/17 0826 12/25/17 1022  BP: (!) 123/53 120/64  123/60  Pulse: 84 70  74  Resp: 20 18    Temp: 98.4 F (36.9 C) 98.3 F (36.8 C)    TempSrc: Oral Oral    SpO2: 97% 98% 92%     Intake/Output Summary (Last 24 hours)  at 12/25/2017 1128 Last data filed at 12/25/2017 0445 Gross per 24 hour  Intake 650.98 ml  Output 1915 ml  Net -1264.02 ml   There were no vitals filed for this visit.  Exam: General exam: In no acute distress. Respiratory system: Good air movement and clear to auscultation. Cardiovascular system: S1 & S2 heard, RRR.  Gastrointestinal system: Abdomen is nondistended, soft and nontender.  Central nervous system: Alert and oriented. No focal neurological deficits. Extremities: No pedal edema. Skin: No rashes, lesions or ulcers Psychiatry: Judgement and insight appear normal. Mood & affect appropriate.    Data Reviewed:    Labs: Basic Metabolic Panel: Recent Labs  Lab 12/23/17 1948 12/24/17 0548 12/25/17 0614  NA 138 142 141  K 5.1 3.0* 3.4*  CL 104 111 107  CO2 20* 23 24  GLUCOSE 106* 78 82  BUN 26* 22 13  CREATININE 0.88 0.59 0.48  CALCIUM 8.8* 8.1* 8.0*   GFR CrCl cannot be calculated (Unknown ideal weight.). Liver Function Tests: Recent Labs  Lab 12/24/17 0548  AST 55*  ALT 26  ALKPHOS 52  BILITOT 0.9  PROT 5.7*  ALBUMIN 2.8*   No results for input(s): LIPASE, AMYLASE in the last 168 hours. No results for input(s): AMMONIA in the last 168 hours. Coagulation profile No results for input(s): INR, PROTIME in the last 168 hours.  CBC: Recent Labs  Lab 12/23/17 2101 12/24/17 0548 12/25/17 0614  WBC 17.8* 15.7* 9.3  NEUTROABS 15.3*  --   --   HGB 11.9* 9.9* 9.4*  HCT 38.9 32.5* 31.0*  MCV 81.0 82.3 82.0  PLT 355 322 298   Cardiac Enzymes: Recent Labs  Lab 12/23/17 1948 12/24/17 0548 12/25/17 0614  CKTOTAL 1,886* 1,171* 531*   BNP (last 3 results) No results for input(s): PROBNP in the  last 8760 hours. CBG: No results for input(s): GLUCAP in the last 168 hours. D-Dimer: No results for input(s): DDIMER in the last 72 hours. Hgb A1c: No results for input(s): HGBA1C in the last 72 hours. Lipid Profile: No results for input(s): CHOL, HDL, LDLCALC, TRIG, CHOLHDL, LDLDIRECT in the last 72 hours. Thyroid function studies: Recent Labs    12/24/17 0951  TSH 0.214*   Anemia work up: No results for input(s): VITAMINB12, FOLATE, FERRITIN, TIBC, IRON, RETICCTPCT in the last 72 hours. Sepsis Labs: Recent Labs  Lab 12/23/17 2101 12/24/17 0548 12/25/17 0614  WBC 17.8* 15.7* 9.3   Microbiology No results found for this or any previous visit (from the past 240 hour(s)).   Medications:   . amLODipine  10 mg Oral Daily  . cycloSPORINE  1 drop Both Eyes BID  . darifenacin  7.5 mg Oral Daily  . docusate sodium  100 mg Oral BID  . enoxaparin (LOVENOX) injection  40 mg Subcutaneous QHS  . ferrous sulfate  325 mg Oral Q breakfast  . hydrochlorothiazide  12.5 mg Oral BID  . lisinopril  20 mg Oral BID  . loratadine  10 mg Oral Daily  . magnesium oxide  400 mg Oral Daily  . mometasone-formoterol  2 puff Inhalation BID  . montelukast  10 mg Oral QHS  . pantoprazole  40 mg Oral Daily  . potassium chloride SA  20 mEq Oral BID  . simvastatin  20 mg Oral QHS   Continuous Infusions: . sodium chloride 100 mL/hr at 12/25/17 0749      LOS: 2 days   Charlynne Cousins  [facility] Pager [phone]  *Please refer  to [email or web address], password [email or web address] to get updated schedule on who will round on this patient, as hospitalists switch teams weekly. If 7PM-7AM, please contact night-coverage at [email or web address], password [email or web address] for any overnight needs.  12/25/2017, 11:28 AM

## 2017-12-25 NOTE — Care Management Note (Signed)
Case Management Note  Patient Details  Name: [name] MRN: [id number] Date of Birth: 1930-05-30  Subjective/Objective:                  Discharge planning  Action/Plan: Spoke with daughter at length about hhc and area providers, list of medicare certified providers given to the daughter.  List reviewed as to areas served and the difference between and non-certified medicare provider and a certified provider, daughter acknowledged understanding and will speak with patient as to whom to chose.  Expected Discharge Date:                  Expected Discharge Plan:  [place]  In-House Referral:     Discharge planning Services  CM Consult  Post Acute Care Choice:  Durable Medical Equipment, Home Health Choice offered to:  Adult Children  DME Arranged:    DME Agency:     HH Arranged:    HH Agency:     Status of Service:  In process, will continue to follow  If discussed at Long Length of Stay Meetings, dates discussed:    Additional Comments:  Leeroy Cha, RN 12/25/2017, 2:34 PM

## 2017-12-26 DIAGNOSIS — D72829 Elevated white blood cell count, unspecified: Secondary | ICD-10-CM

## 2017-12-26 DIAGNOSIS — I5032 Chronic diastolic (congestive) heart failure: Secondary | ICD-10-CM

## 2017-12-26 LAB — T3: T3, Total: 90 ng/dL (ref 71–180)

## 2017-12-26 MED ORDER — TUBERCULIN PPD 5 UNIT/0.1ML ID SOLN
5.0000 [IU] | Freq: Once | INTRADERMAL | Status: DC
  Filled 2017-12-26: qty 0.1

## 2017-12-26 NOTE — Care Management Note (Addendum)
Case Management Note  Patient Details  Name: CLARAMAE RIGDON MRN: [id number] Date of Birth: 1930/05/17  Subjective/Objective:                  Discharge planning: patient will go home and then to alf/  Action/Plan: dme-advanced hhc w.c. With [place]- [facility]-RN,OT and PT representative notified and acknowledgement rec'd Expected Discharge Date:  12/25/17               Expected Discharge Plan:  [place]  In-House Referral:  Clinical Social Work  Discharge planning Services  CM Consult  Post Acute Care Choice:  Durable Medical Equipment, Home Health Choice offered to:  Adult Children  DME Arranged:  3-N-1, Programmer, multimedia DME Agency:  [place] Arranged:  RN, PT, OT [place] Agency:  [facility] (now [facility])  Status of Service:  Completed, signed off  If discussed at [place] of Stay Meetings, dates discussed:    Additional Comments:  Leeroy Cha, RN 12/26/2017, 11:55 AM

## 2017-12-26 NOTE — Clinical Social Work Note (Signed)
Clinical Social Work Assessment  Patient Details  Name: [name] MRN: [id number] Date of Birth: Jul 08, 1930  Date of referral:  12/26/17               Reason for consult:  Facility Placement                Permission sought to share information with:  Facility Art therapist granted to share information::  Yes, Verbal Permission Granted  Name::     [name]  Agency::  ALF  Relationship::  Daughter  Contact Information:  [phone]  Housing/Transportation Living arrangements for the past 2 months:  [place] of Information:  Patient, Adult Children Patient Interpreter Needed:  None Criminal Activity/Legal Involvement Pertinent to Current Situation/Hospitalization:  No - Comment as needed Significant Relationships:  Adult Children Lives with:  Self Do you feel safe going back to the place where you live?  Yes Need for family participation in patient care:  Yes (Comment)  Care giving concerns:  Patient lives independently at home and has two daughters who help with decision making. They do not feel she is safe to return home and wish to set up ALF placement.   Social Worker assessment / plan:  CSW consulted for ALF placement. Noted that family has spoken to [facility] ALF to arrange for patient to d/c there.   CSW spoke with patient and daughters at bedside to discuss discharge plan. Daughter, [name], states their plan is for patient to discharge to [facility]F because they do not feel patient is safe to return home. Patient was previously living independently and experienced a fall at home. Patient's daughters are hopeful that she will be able to return to living independently after getting care in ALF with [facility] therapy services. Patient is frustrated that daughters are making decisions for her but seemingly agreeable. Daughters had questions regarding therapy at ALF. CSW explained role and difference between SNF and [facility]  therapy. Daughter, [name], expressed understanding.   CSW spoke with [facility] who confirmed patient will need TB test read before she can admit to facility. They also require one of their RNs to come do an assessment on patient before she can admit - RN will be at [facility] later this afternoon to meet patient.   Family initially wanted patient to d/c directly to ALF from hospital. RN spoke with family who is agreeable to take patient home first, get TB test done by PCP, and admit to ALF from home. MD is aware patient will d/c home.   No apparent CSW needs. Signing off - please reconsult if need arises.   Employment status:  Retired Forensic scientist:  Medicare PT Recommendations:  Home with [place] / Referral to community resources:     Patient/Family's Response to care:  Family expressed frustration that CSW had not assisted in ALF placement prior to today. CSW explained that social work was not aware family was considering ALF. Patient expressed frustration that daughters are making decisions for her. Family agreeable to take patient home at d/c and admit her to ALF from there.   Patient/Family's Understanding of and Emotional Response to Diagnosis, Current Treatment, and Prognosis:  Family seemingly understands discharge plan and ALF placement process. They will d/c to home today after [facility] ALF RN does assessment (likely early afternoon). They will get TB test done by PCP and admit to ALF from home.  Emotional Assessment Appearance:  Appears stated age Attitude/Demeanor/Rapport:  Affect (typically observed):  Appropriate, Frustrated Orientation:  Oriented to Self, Oriented to Place, Oriented to  Time, Oriented to Situation Alcohol / Substance use:  Not Applicable Psych involvement (Current and /or in the community):  No (Comment)  Discharge Needs  Concerns to be addressed:  No discharge needs identified Readmission within the last 30 days:   No Current discharge risk:  Physical Impairment Barriers to Discharge:  No Barriers Identified   Pricilla Holm, [place] 12/26/2017, 11:44 AM

## 2017-12-26 NOTE — Care Management Note (Signed)
Case Management Note  Patient Details  Name: [name] MRN: [id number] Date of Birth: Jun 29, 1930  Subjective/Objective:                  Discharge planning:  Action/Plan: Family wants to place patient in ALF-[place]/CSW consult entered into system for ALF placement.  Family wants to use [facility] for hhc.  Expected Discharge Date:  12/25/17               Expected Discharge Plan:  Assisted Living / Rest Home  In-House Referral:     Discharge planning Services  CM Consult  Post Acute Care Choice:  Durable Medical Equipment, Home Health Choice offered to:  Adult Children  DME Arranged:    DME Agency:     HH Arranged:    HH Agency:  [facility] (now [facility])  Status of Service:  In process, will continue to follow  If discussed at Long Length of Stay Meetings, dates discussed:    Additional Comments:  Leeroy Cha, RN 12/26/2017, 10:48 AM

## 2017-12-26 NOTE — Progress Notes (Signed)
Occupational Therapy Treatment Patient Details Name: [name] MRN: [id number] DOB: December 04, 1930 Today's Date: 12/26/2017    History of present illness [name] is a 84 y.o. year old female with medical history significant for HTN, HLD, GERD, history of breast cancer at home independently PTA who presented on 12/23/2017 with unwitnessed fall.    OT comments  Pt seen for OT ADL retraining session with focus on dressing UB/LB placing L side into clothing first then R due to soreness from fall; simulated toilet transfer, verbal discussion of energy conservation techniques as pt fatigues easily, daughter present throughout session and states that family will be available for PRN A at d/c. Pt is limited by L LE and UE soreness.   Follow Up Recommendations  Supervision/Assistance - 24 hour;Home health OT    Equipment Recommendations  None recommended by OT    Recommendations for Other Services      Precautions / Restrictions Precautions Precautions: Fall Precaution Comments: pt/family deny h/o other falls in past year Restrictions Weight Bearing Restrictions: No       Mobility Bed Mobility Overal bed mobility: (Pt sitting up in chair upon OT arrival)                Transfers Overall transfer level: Needs assistance Equipment used: Rolling walker (2 wheeled) Transfers: Sit to/from Stand Sit to Stand: Min assist Stand pivot transfers: Min assist       General transfer comment: VC's for safety/sequencing, hand placement, increased time 2* pain    Balance Overall balance assessment: Needs assistance Sitting-balance support: Feet supported;No upper extremity supported Sitting balance-Leahy Scale: [name]     Standing balance support: Bilateral upper extremity supported Standing balance-Leahy Scale: [name]                             ADL either performed or assessed with clinical judgement   ADL Overall ADL's : Needs assistance/impaired      Grooming: Set up;Sitting           Upper Body Dressing : Minimal assistance;Sitting Upper Body Dressing Details (indicate cue type and reason): OT gathered clothing and then only Min A secondary to shoulder soreness. Educated pt & daughter in dressing L side first due to soreness from fall. Lower Body Dressing: Minimal assistance;Sit to/from stand Lower Body Dressing Details (indicate cue type and reason): Assist to don underwear, pants and Min A sit to stand & pulling up clothing. VC's to don LLE first then R Toilet Transfer: Minimal assistance;Stand-pivot;BSC(Simulated transfer from chair, sit to stand and back to chair after a few steps)           Functional mobility during ADLs: Minimal assistance;Rolling walker(Min A from recliner) General ADL Comments: Pt seen for OT ADL retraining session with focus on dressing UB/LB placing L side into clothing first then R due to soreness from fall; simulated toilet transfer, verbal discussion of energy conservation techniques as pt fatigues easily, daughter present throughout session and states that family will be available for PRN A at d/c. Pt is limited by L LE and UE soreness.     Vision Patient Visual Report: No change from baseline     Perception     Praxis      Cognition Arousal/Alertness: Awake/alert Behavior During Therapy: WFL for tasks assessed/performed Overall Cognitive Status: Within Functional Limits for tasks assessed  Exercises     Shoulder Instructions       General Comments      Pertinent Vitals/ Pain       Pain Assessment: Faces Faces Pain Scale: Hurts even more Pain Location: L lateral thigh and L shoulder Pain Descriptors / Indicators: Sore;Aching Pain Intervention(s): Limited activity within patient's tolerance;Monitored during session;Repositioned;Other (comment)(Pt daughter states pt had pain meds prior to this session)  Home Living                                           Prior Functioning/Environment              Frequency  Min 2X/week        Progress Toward Goals  OT Goals(current goals can now be found in the care plan section)  Progress towards OT goals: Progressing toward goals  Acute Rehab OT Goals Patient Stated Goal: Go home today  Plan Discharge plan remains appropriate    Co-evaluation                 AM-PAC PT "6 Clicks" Daily Activity     Outcome Measure   Help from another person eating meals?: A Little Help from another person taking care of personal grooming?: A Little Help from another person toileting, which includes using toliet, bedpan, or urinal?: A Lot Help from another person bathing (including washing, rinsing, drying)?: A Little Help from another person to put on and taking off regular upper body clothing?: A Little Help from another person to put on and taking off regular lower body clothing?: A Little 6 Click Score: 17    End of Session Equipment Utilized During Treatment: Gait belt;Rolling walker  OT Visit Diagnosis: Muscle weakness (generalized) (M62.81);Unsteadiness on feet (R26.81);Pain Pain - Right/Left: Left Pain - part of body: (Shoulder/thigh L UE/LE)   Activity Tolerance Patient limited by pain;Patient tolerated treatment well   Patient Left in chair;with call bell/phone within reach;with chair alarm set   Nurse Communication          Time: 0950-1004 OT Time Calculation (min): 14 min  Charges: OT General Charges $OT Visit: 1 Visit OT Treatments $Self Care/Home Management : 8-22 mins    Bona Hubbard Beth Dixon, OTR/L 12/26/2017, 10:18 AM

## 2017-12-26 NOTE — Progress Notes (Signed)
    Durable Medical Equipment  (From admission, onward)         Start     Ordered   12/26/17 1220  For home use only DME standard manual wheelchair with seat cushion  Once    Comments:  Patient suffers from weakness which impairs their ability to perform daily activities like grooming in the home.  A cane will not resolve  issue with performing activities of daily living. A wheelchair will allow patient to safely perform daily activities. Patient can safely propel the wheelchair in the home or has a caregiver who can provide assistance.  Accessories: elevating leg rests (ELRs), wheel locks, extensions and anti-tippers.   12/26/17 1220   12/26/17 1147  For home use only DME 3 n 1  Once     12/26/17 1146

## 2017-12-27 ENCOUNTER — Ambulatory Visit: Payer: Medicare Other

## 2017-12-27 ENCOUNTER — Telehealth: Payer: Self-pay

## 2017-12-27 ENCOUNTER — Telehealth: Payer: Self-pay | Admitting: *Deleted

## 2017-12-27 NOTE — Telephone Encounter (Signed)
Copied from [place] [id number]. Topic: Quick Communication - Home Health Verbal Orders >> Dec 27, 2017 10:08 AM Scherrie Gerlach wrote: Caller/Agency: [facility] Callback Number: [phone]  PT [name] calling to let Dr Regis Bill know they will be starting PT on Tues, 10/29 with the pt

## 2017-12-27 NOTE — Telephone Encounter (Signed)
FYI

## 2017-12-27 NOTE — Telephone Encounter (Signed)
Copied from [place] [id number]. Topic: General - Other >> Dec 27, 2017  3:28 PM Yvette Rack wrote: Reason for CRM: pt daughter Clydene Burack [phone] states that her Mother was going to be admitted into assisting living at [facility] on [address] and need a FML2 filled out she would like to get Mother in on Monday for an appt with Dr Regis Bill

## 2017-12-28 NOTE — Telephone Encounter (Signed)
Patient's daughter called in to Saturday clinic. She requested a note to be sent advising that she will be dropping off the form on Monday 12/30/17.

## 2017-12-30 ENCOUNTER — Ambulatory Visit (INDEPENDENT_AMBULATORY_CARE_PROVIDER_SITE_OTHER): Payer: Medicare Other | Admitting: Internal Medicine

## 2017-12-30 ENCOUNTER — Other Ambulatory Visit: Payer: Self-pay | Admitting: *Deleted

## 2017-12-30 ENCOUNTER — Encounter: Payer: Self-pay | Admitting: Internal Medicine

## 2017-12-30 VITALS — BP 110/62 | HR 79 | Temp 97.9°F | Wt 140.0 lb

## 2017-12-30 DIAGNOSIS — D649 Anemia, unspecified: Secondary | ICD-10-CM

## 2017-12-30 DIAGNOSIS — K5520 Angiodysplasia of colon without hemorrhage: Secondary | ICD-10-CM | POA: Diagnosis not present

## 2017-12-30 DIAGNOSIS — Z9181 History of falling: Secondary | ICD-10-CM | POA: Diagnosis not present

## 2017-12-30 DIAGNOSIS — I10 Essential (primary) hypertension: Secondary | ICD-10-CM | POA: Diagnosis not present

## 2017-12-30 DIAGNOSIS — Z79899 Other long term (current) drug therapy: Secondary | ICD-10-CM | POA: Diagnosis not present

## 2017-12-30 DIAGNOSIS — E0590 Thyrotoxicosis, unspecified without thyrotoxic crisis or storm: Secondary | ICD-10-CM

## 2017-12-30 DIAGNOSIS — Z09 Encounter for follow-up examination after completed treatment for conditions other than malignant neoplasm: Secondary | ICD-10-CM

## 2017-12-30 NOTE — Patient Outreach (Signed)
[facility]) Care Management  12/30/2017  [name] 05/28/1930 [id number]   EMMI-general discharge RED ON EMMI ALERT Day # 1 Date: 12/28/17  Red Alert Reason: got discharge papers? No  Know how to call about changes in condition? No    Outreach attempt #1 successful at her home number  Patient is able to verify HIPAA [place] Management RN reviewed and addressed red alert with patient [name] reports the answers to the EMMI questions are correct. She states she do not have a copy of the hospitial discharge sheets [facility] RN CM went over each of the 6 pages of the discharge sheets/after hospital visit   [facility] RN CM later [phone]) received a call through [place] from [name] who had attempted to call CM back after she attempted to reach CM via the number that Heather Musco wrote down.  She was able to verify HIPAA. Mateo [name] inquired about the reason for CM's call to [name]. [facility] Care Management RN informed Mateo [name] CM reviewed the EMMI red alert and sent Heather Jocelyn a copy of her hospital discharge instructions. The line was disconnected and CM reached back out to [place]. Mateo [name] had questions about home health which was not indicated on the hospital discharge instructions and she was referred to Dr Regis [name]  Social:  [name] reports she lives alone but had lots of company over the weekend as family and friends visit. She has her daughter as care giver. She reports being independent/assist with all care. She denies issues with transportation to medical appointments. She saw her MD today   Conditions: HTN, CHF,, asthma, edema, breast cancer, hypokalemia, fall overactive bladder, allergies, AVM,  Hematuria, anemia, arthritis, GERD, HDL   Medications: denies concerns with taking medications as prescribed, affording medications, side effects of medications and questions about medications    Appointments: She was seen by Dr Regis [name]  today to have her assist with FL2 and needs to go to [facility] on [address] [place]: Denies need for assist with advance directives   Consent: [facility] RN CM reviewed [facility] services with patient. Patient gave verbal consent for services. Advised patient that there will be further automated EMMI- post discharge calls to assess how the patient is doing following the recent hospitalization Advised the patient that another call may be received from a nurse if any of their responses were abnormal. Patient voiced understanding and was appreciative of f/u call. SHe denies need of services from [facility] Community/Telephonic RN CM, pharmacy, health coach, NP or SW at this time   Plan: [place] CM will close case at this time as patient has been assessed and no needs identified.   Evrett Hakim L. Lavina Hamman, RN, BSN, [place] Coordinator Office number [phone] Mobile number [phone]  Main [facility] number [phone] Fax number [phone]

## 2017-12-30 NOTE — Progress Notes (Signed)
Chief Complaint  Patient presents with  . Form Completion    FL2 form for residency at [facility], hospital follow up  etc     HPI: [name] 84 y.o. come in for  Fu  Hospitalization  and  fl2 form for assisted living  Here with daughter V hosp ffor  MS changes s/p  found  fter fall and had dehydration and    secondary rhabdo  Renal  failuresecondary  .  Patient denies   cv sx pre fall says fell backward and doenst know why  And then foiund later  .  She is planned to get  Monitor via cardiology to cehck for secondary causes . See below   She will now go to assisted living monitored care  .   No bleeding  Noted   ROS: See pertinent positives and negatives per HPI. No cp sob bleeding  Edema the same   Admit date: 12/23/2017 Discharge date: 12/25/2017  Admitted From: home Disposition:  Home  Recommendations for Outpatient Follow-up:  1. Follow up with Cardiology in 1-2 weeks for Holter monitor as an outpatient. 2. Please obtain BMP/CBC in one week   Home Health:Yes Equipment/Devices:none  Discharge Condition:stable CODE STATUS:full Diet recommendation: Heart Healthy   Brief/Interim Summary: 84 year old with past medical history significant for hypertension breast cancer who lives independent presents on 12/23/2017 for an unwitnessed mechanical fall.  Discharge Diagnoses:  Principal Problem:   Fall Active Problems:   Essential hypertension   GERD   Malignant neoplasm of female breast ([place])   Hypokalemia   Rhabdomyolysis   Leucocytosis   Leukocytosis   Chronic diastolic CHF (congestive heart failure) (HCC)  Unwitnessed mechanical fall: Cardiac biomarkers were negative, an MRI of the brain was negative for acute stroke, chest x-ray and CT scan of the spine showed no acute fracture EKG shows no acute findings she had no events on telemetry. Denies any current loss of consciousness. Infection is unlikely due to a normal UA and no findings on chest  x-ray. There have been no changes in her medications no new medication no electrolyte abnormality her kidney and liver functions were normal orthostatics have been negative, her TSH was 0 point two 2D echo was done that showed no left ear and a grade 1 diastolic heart failure. We have made a follow-up appointment with cardiology with a Holter monitor as an outpatient.  Non-traumaticRhabdomyolysis: Likely due to immobility this resolved with IV fluid hydration.  Leukocytosis: Likely stress demargination she remained afebrile work-up so far has been negative and her leukocytosis is slowly improving.  Urinary retention: Has now resolved.  Essential hypertension: No changes were made to her medication.  Normocytic anemia: Her hemoglobin has remained stable no signs of overt bleeding.   Discharge Instructions      Discharge Instructions    Diet - low sodium heart healthy   Complete by:  As directed    Increase activity slowly   Complete by:  As directed      Allergies as of 12/25/2017      Past Medical History:  Diagnosis Date  . Allergy   . Anemia 2011   Since last visit she has had a Gi evaluation for newer onset Iron deficiiency anemia that was felt secondary to avm in duodenum rx with laser ablation.  . Arthritis    in back  . Asthma   . Cancer ([place])    rt breast  . GERD (gastroesophageal reflux disease)   .  Hyperlipidemia   . Hypertension     Family History  Problem Relation Age of Onset  . Cancer Mother        breast and ovarian    Social History   Socioeconomic History  . Marital status: Widowed    Spouse name: Not on file  . Number of children: Not on file  . Years of education: Not on file  . Highest education level: Not on file  Occupational History  . Not on file  Social Needs  . Financial resource strain: Not on file  . Food insecurity:    Worry: Not on file    Inability: Not on file  . Transportation needs:    Medical: Not  on file    Non-medical: Not on file  Tobacco Use  . Smoking status: Former Smoker    Packs/day: 0.25    Years: 4.00    Pack years: 1.00    Types: Cigarettes    Last attempt to quit: 03/05/1968    Years since quitting: 49.8  . Smokeless tobacco: Never Used  Substance and Sexual Activity  . Alcohol use: No  . Drug use: No  . Sexual activity: Not on file  Lifestyle  . Physical activity:    Days per week: Not on file    Minutes per session: Not on file  . Stress: Not on file  Relationships  . Social connections:    Talks on phone: Not on file    Gets together: Not on file    Attends religious service: Not on file    Active member of club or organization: Not on file    Attends meetings of clubs or organizations: Not on file    Relationship status: Not on file  Other Topics Concern  . Not on file  Social History Narrative   Widowed 2001    Ex smoker   HHof 1    Active in church and volunteer activities    [place] a few times per week   Swimming 2 x per week.    Tobacco 49=56 less than 1ppd.              Outpatient Medications Prior to Visit  Medication Sig Dispense Refill  . amLODipine (NORVASC) 10 MG tablet TAKE ONE TABLET BY MOUTH DAILY 90 tablet 0  . budesonide-formoterol (SYMBICORT) 160-4.5 MCG/ACT inhaler Inhale 2 puffs into the lungs 2 (two) times daily as needed (Shortness of breath).     . fexofenadine (ALLEGRA) 180 MG tablet Take 180 mg by mouth daily.     Marland Kitchen KLOR-CON M20 20 MEQ tablet TAKE 1 TABLET TWICE A DAY 180 tablet 0  . lisinopril-hydrochlorothiazide (PRINZIDE,ZESTORETIC) 20-12.5 MG tablet TAKE ONE TABLET BY MOUTH TWO TIMES A DAY 180 tablet 1  . cycloSPORINE (RESTASIS) 0.05 % ophthalmic emulsion Place 1 drop into both eyes 2 (two) times daily.    Marland Kitchen EPIPEN 2-PAK 0.3 MG/0.3ML SOAJ injection Inject 0.3 mg into the muscle once.   1  . Ketotifen Fumarate (ALLERGY EYE DROPS OP) Apply 1 drop to eye daily.    Marland Kitchen omeprazole (PRILOSEC) 20 MG capsule Take 1 capsule (20  mg total) by mouth daily as needed (Acid Reflux). 90 capsule 2  . simvastatin (ZOCOR) 20 MG tablet Take 1 tablet (20 mg total) by mouth at bedtime. 90 tablet 3  . solifenacin (VESICARE) 5 MG tablet Take 1 tablet (5 mg total) by mouth daily. 90 tablet 3   No facility-administered medications prior to visit.  EXAM:  BP 110/62 (BP Location: Right Arm, Patient Position: Sitting, Cuff Size: Normal)   Pulse 79   Temp 97.9 F (36.6 C) (Oral)   Wt 140 lb (63.5 kg)   SpO2 97%   BMI 28.28 kg/m   Body mass index is 28.28 kg/m.  GENERAL: vitals reviewed and listed above, alert, oriented, appears well hydrated and in no acute distress has walker alert in nad  HEENT: atraumatic, conjunctiva  clear, no obvious abnormalities on inspection of external nose and ears OP : no lesion edema or exudate  NECK: no obvious masses on inspection palpation  LUNGS: clear to auscultation bilaterally, no wheezes, rales or rhonchi, good air movement CV: HRRR, no clubbing cyanosis or 1-2  nl cap refill  No change  MS: moves all extremities without noticeable focal  Abnormality mobility assisted but independent  .  PSYCH: pleasant and cooperative, no obvious depression or anxiety no gross neuro focal change  Neuro non focal nl speech   Skin no bruising or bleeding BP Readings from Last 3 Encounters:  12/30/17 110/62  12/26/17 117/63  10/18/17 (!) 142/70    ASSESSMENT AND PLAN:  Discussed the following assessment and plan:  Hospital discharge follow-up  Well-controlled hypertension - Plan: Basic metabolic panel, CBC with Differential/Platelet, Iron, TIBC and Ferritin Panel  Subclinical hyperthyroidism - Plan: Basic metabolic panel, CBC with Differential/Platelet, Iron, TIBC and Ferritin Panel, TSH, T4, free  Anemia, unspecified type - Plan: Basic metabolic panel, CBC with Differential/Platelet, Iron, TIBC and Ferritin Panel  Medication management - Plan: Basic metabolic panel, CBC with  Differential/Platelet, Iron, TIBC and Ferritin Panel  History of fall    Here with daughter today we will fill out FL to patient had what sounds like a fall and inability to get help secondary hydration elevated CPK uncertain cause except she does have arthritic instability. Hospital reviewed Plan follow-up BMP anemia check and TSH which has been off but patient did not want to pursue endocrinology consult previously. Cautioned that if blood pressure readings are low we would back off on her blood pressure medicines. Agree to follow-up with cardiology rule out arrhythmia. FL2  Reviewed and to be sent in faxed  Counseled.   -Patient advised to return or notify health care team  if  new concerns arise.  Patient Instructions  Keep  Hydrated .  Will finish completing form . Would have  Check bmp and cbcdiff  Thyroid  Iron in 2 -3  weeks  .  If bp is low causing dizziness we need to decreaes the BP medication.  If bp below  110 then contact us for advice .    ROV in 1-2 months depending on lab and how you are doing. I agree with physical therapy  .   Standley Brooking. Panosh M.D.

## 2017-12-30 NOTE — Patient Instructions (Addendum)
Keep  Hydrated .  Will finish completing form . Would have  Check bmp and cbcdiff  Thyroid  Iron in 2 -3  weeks  .  If bp is low causing dizziness we need to decreaes the BP medication.  If bp below  110 then contact us for advice .    ROV in 1-2 months depending on lab and how you are doing. I agree with physical therapy  .

## 2017-12-30 NOTE — Telephone Encounter (Signed)
Pt seen in office this morning.   Will send to Dr Regis Bill advise once forms are complete.

## 2017-12-30 NOTE — Telephone Encounter (Signed)
Pt has been scheduled.  °

## 2017-12-31 ENCOUNTER — Telehealth: Payer: Self-pay | Admitting: Internal Medicine

## 2017-12-31 DIAGNOSIS — J3081 Allergic rhinitis due to animal (cat) (dog) hair and dander: Secondary | ICD-10-CM | POA: Diagnosis not present

## 2017-12-31 DIAGNOSIS — K8020 Calculus of gallbladder without cholecystitis without obstruction: Secondary | ICD-10-CM | POA: Diagnosis not present

## 2017-12-31 DIAGNOSIS — K760 Fatty (change of) liver, not elsewhere classified: Secondary | ICD-10-CM | POA: Diagnosis not present

## 2017-12-31 DIAGNOSIS — M479 Spondylosis, unspecified: Secondary | ICD-10-CM | POA: Diagnosis not present

## 2017-12-31 DIAGNOSIS — D649 Anemia, unspecified: Secondary | ICD-10-CM | POA: Diagnosis not present

## 2017-12-31 DIAGNOSIS — I110 Hypertensive heart disease with heart failure: Secondary | ICD-10-CM | POA: Diagnosis not present

## 2017-12-31 DIAGNOSIS — D509 Iron deficiency anemia, unspecified: Secondary | ICD-10-CM | POA: Diagnosis not present

## 2017-12-31 DIAGNOSIS — Z853 Personal history of malignant neoplasm of breast: Secondary | ICD-10-CM | POA: Diagnosis not present

## 2017-12-31 DIAGNOSIS — J3089 Other allergic rhinitis: Secondary | ICD-10-CM | POA: Diagnosis not present

## 2017-12-31 DIAGNOSIS — M5030 Other cervical disc degeneration, unspecified cervical region: Secondary | ICD-10-CM | POA: Diagnosis not present

## 2017-12-31 DIAGNOSIS — Z9011 Acquired absence of right breast and nipple: Secondary | ICD-10-CM | POA: Diagnosis not present

## 2017-12-31 DIAGNOSIS — N3281 Overactive bladder: Secondary | ICD-10-CM | POA: Diagnosis not present

## 2017-12-31 DIAGNOSIS — Z87891 Personal history of nicotine dependence: Secondary | ICD-10-CM | POA: Diagnosis not present

## 2017-12-31 DIAGNOSIS — Q2733 Arteriovenous malformation of digestive system vessel: Secondary | ICD-10-CM | POA: Diagnosis not present

## 2017-12-31 DIAGNOSIS — Z9181 History of falling: Secondary | ICD-10-CM | POA: Diagnosis not present

## 2017-12-31 DIAGNOSIS — E079 Disorder of thyroid, unspecified: Secondary | ICD-10-CM | POA: Diagnosis not present

## 2017-12-31 DIAGNOSIS — J45909 Unspecified asthma, uncomplicated: Secondary | ICD-10-CM | POA: Diagnosis not present

## 2017-12-31 DIAGNOSIS — E559 Vitamin D deficiency, unspecified: Secondary | ICD-10-CM | POA: Diagnosis not present

## 2017-12-31 DIAGNOSIS — I5032 Chronic diastolic (congestive) heart failure: Secondary | ICD-10-CM | POA: Diagnosis not present

## 2017-12-31 DIAGNOSIS — M19011 Primary osteoarthritis, right shoulder: Secondary | ICD-10-CM | POA: Diagnosis not present

## 2017-12-31 DIAGNOSIS — J301 Allergic rhinitis due to pollen: Secondary | ICD-10-CM | POA: Diagnosis not present

## 2017-12-31 DIAGNOSIS — K5730 Diverticulosis of large intestine without perforation or abscess without bleeding: Secondary | ICD-10-CM | POA: Diagnosis not present

## 2017-12-31 DIAGNOSIS — Z96641 Presence of right artificial hip joint: Secondary | ICD-10-CM | POA: Diagnosis not present

## 2017-12-31 DIAGNOSIS — M1612 Unilateral primary osteoarthritis, left hip: Secondary | ICD-10-CM | POA: Diagnosis not present

## 2017-12-31 DIAGNOSIS — K5520 Angiodysplasia of colon without hemorrhage: Secondary | ICD-10-CM | POA: Diagnosis not present

## 2017-12-31 NOTE — Telephone Encounter (Signed)
Copied from [place] [id number]. Topic: Quick Communication - See Telephone Encounter >> Dec 31, 2017  3:06 PM Vernona Rieger wrote: CRM for notification. See Telephone encounter for: 12/31/17.  Debbie, physical therapist with [facility] called to get orders to see her for physical therapy.  2 times a week for 8 weeks  Call back @ [phone]

## 2017-12-31 NOTE — Telephone Encounter (Signed)
Please advise Dr Panosh, thanks.   

## 2017-12-31 NOTE — Telephone Encounter (Signed)
Pt daughter  Mateo Flow is calling and [facility] is still waiting on FL2

## 2017-12-31 NOTE — Telephone Encounter (Signed)
Approve orders

## 2017-12-31 NOTE — Telephone Encounter (Signed)
Pt daughter Mateo Flow is aware that form is complete and has been faxed.  Originals sent to scan.

## 2017-12-31 NOTE — Telephone Encounter (Signed)
Copied from [place] [id number]. Topic: Quick Communication - See Telephone Encounter >> Dec 31, 2017  8:36 AM Conception Chancy, NT wrote: CRM for notification. See Telephone encounter for: 12/31/17.  Lilly Sheppard Coil is calling from [employer] in regards to the patient FL2 form. She states the form was filled out yesterday and the patient is moving in today and they have not received the form yet. Lilly would like a call back at [phone].  The form can be faxed to [phone] attention Horatio Pel.

## 2017-12-31 NOTE — Telephone Encounter (Signed)
Form has been faxed.

## 2018-01-01 ENCOUNTER — Encounter: Payer: Medicare Other | Admitting: Internal Medicine

## 2018-01-01 NOTE — Addendum Note (Signed)
Addended by: Virl Cagey on: 01/01/2018 10:28 AM   Modules accepted: Orders

## 2018-01-01 NOTE — Telephone Encounter (Signed)
[name] w/ [employer], called in she did receive the FL2 form back but it isn't completed as it should. Heather says that the directions of how the medication is taken should be listed on the form and not on a print out (to see attachment) and also the "no none allergies" were checked but pt has an epi pen prescribed? She stated that the family is really trying to get pt in today if possible and the facility deadline for today is at 2:00pm. If at all possible Heather would like to have this form updated and faxed back as soon as possible.

## 2018-01-01 NOTE — Telephone Encounter (Signed)
Spoke with Tim Lair, aware that I have filled out form as specified.  Tim Lair states that the patient has not used the Epi-Pen "ever" and has not had on prescribed in several years.  This was removed from her Med List.   Forms refaxed. Pt is going to move in  Today.  Nothing further needed.

## 2018-01-01 NOTE — Telephone Encounter (Signed)
Left detailed message on machine for [name] to make aware that the PT orders were approved.  Nothing further needed.

## 2018-01-03 DIAGNOSIS — I110 Hypertensive heart disease with heart failure: Secondary | ICD-10-CM | POA: Diagnosis not present

## 2018-01-03 DIAGNOSIS — M5030 Other cervical disc degeneration, unspecified cervical region: Secondary | ICD-10-CM | POA: Diagnosis not present

## 2018-01-03 DIAGNOSIS — M1612 Unilateral primary osteoarthritis, left hip: Secondary | ICD-10-CM | POA: Diagnosis not present

## 2018-01-03 DIAGNOSIS — I5032 Chronic diastolic (congestive) heart failure: Secondary | ICD-10-CM | POA: Diagnosis not present

## 2018-01-03 DIAGNOSIS — M19011 Primary osteoarthritis, right shoulder: Secondary | ICD-10-CM | POA: Diagnosis not present

## 2018-01-03 DIAGNOSIS — M479 Spondylosis, unspecified: Secondary | ICD-10-CM | POA: Diagnosis not present

## 2018-01-06 ENCOUNTER — Telehealth: Payer: Self-pay | Admitting: Internal Medicine

## 2018-01-06 ENCOUNTER — Inpatient Hospital Stay: Payer: Medicare Other | Admitting: Internal Medicine

## 2018-01-06 NOTE — Telephone Encounter (Signed)
Left detailed message on machine for Jim with verbal orders for OT.

## 2018-01-06 NOTE — Telephone Encounter (Signed)
Ok to do

## 2018-01-06 NOTE — Telephone Encounter (Signed)
Copied from [place] [phone]. Topic: Quick Communication - Home Health Verbal Orders >> Jan 06, 2018  8:56 AM Scherrie Gerlach wrote: Caller/Agency:  [facility] Number: [phone] Requesting OT/PT/Skilled Nursing/Social Work:  OT Frequency: 1 wk 1 2 wk 2 1 wk 1

## 2018-01-07 ENCOUNTER — Telehealth: Payer: Self-pay | Admitting: Internal Medicine

## 2018-01-07 DIAGNOSIS — M5030 Other cervical disc degeneration, unspecified cervical region: Secondary | ICD-10-CM | POA: Diagnosis not present

## 2018-01-07 DIAGNOSIS — I110 Hypertensive heart disease with heart failure: Secondary | ICD-10-CM | POA: Diagnosis not present

## 2018-01-07 DIAGNOSIS — M479 Spondylosis, unspecified: Secondary | ICD-10-CM | POA: Diagnosis not present

## 2018-01-07 DIAGNOSIS — M19011 Primary osteoarthritis, right shoulder: Secondary | ICD-10-CM | POA: Diagnosis not present

## 2018-01-07 DIAGNOSIS — I5032 Chronic diastolic (congestive) heart failure: Secondary | ICD-10-CM | POA: Diagnosis not present

## 2018-01-07 DIAGNOSIS — M1612 Unilateral primary osteoarthritis, left hip: Secondary | ICD-10-CM | POA: Diagnosis not present

## 2018-01-07 NOTE — Telephone Encounter (Signed)
Mateo Flow returned call, states that they were not needing these medication refilled, they were needing them removed from her Med List since she is no longer taking them. Requesting that the updated med list be faxed to [place]: Karena Addison @ fax# [phone]  Med list updated per patient request.  Faxed to [facility] Nothing further needed.

## 2018-01-07 NOTE — Telephone Encounter (Signed)
Pt is requesting refills of: Restasis -- Never prescribed by our office Prilosec -- last filled 11/02/2014 Zocor -- last filled 06/12/2013 Vesicare -- last filled 06/12/2013  Please advise Dr Regis Bill if okay to refill these medications, thanks.    ATC patient to find out pharmacy she would like these sent to, Local vs mail order. No answer on home number and cell number requests that no message be left on voicemail.  Lm with daughter Mateo Flow to verify

## 2018-01-08 ENCOUNTER — Other Ambulatory Visit (INDEPENDENT_AMBULATORY_CARE_PROVIDER_SITE_OTHER): Payer: Medicare Other

## 2018-01-08 ENCOUNTER — Ambulatory Visit (INDEPENDENT_AMBULATORY_CARE_PROVIDER_SITE_OTHER): Payer: Medicare Other

## 2018-01-08 DIAGNOSIS — M1612 Unilateral primary osteoarthritis, left hip: Secondary | ICD-10-CM | POA: Diagnosis not present

## 2018-01-08 DIAGNOSIS — Z79899 Other long term (current) drug therapy: Secondary | ICD-10-CM | POA: Diagnosis not present

## 2018-01-08 DIAGNOSIS — R55 Syncope and collapse: Secondary | ICD-10-CM | POA: Diagnosis not present

## 2018-01-08 DIAGNOSIS — M47816 Spondylosis without myelopathy or radiculopathy, lumbar region: Secondary | ICD-10-CM | POA: Diagnosis not present

## 2018-01-08 DIAGNOSIS — I10 Essential (primary) hypertension: Secondary | ICD-10-CM

## 2018-01-08 DIAGNOSIS — D649 Anemia, unspecified: Secondary | ICD-10-CM | POA: Diagnosis not present

## 2018-01-08 DIAGNOSIS — E0590 Thyrotoxicosis, unspecified without thyrotoxic crisis or storm: Secondary | ICD-10-CM | POA: Diagnosis not present

## 2018-01-08 LAB — BASIC METABOLIC PANEL
BUN: 17 mg/dL (ref 6–23)
CO2: 28 mEq/L (ref 19–32)
Calcium: 9.7 mg/dL (ref 8.4–10.5)
Chloride: 103 mEq/L (ref 96–112)
Creatinine, Ser: 0.57 mg/dL (ref 0.40–1.20)
GFR: 129.46 mL/min (ref >60.00)
Glucose, Bld: 89 mg/dL (ref 70–99)
Potassium: 3.9 mEq/L (ref 3.5–5.1)
Sodium: 140 mEq/L (ref 135–145)

## 2018-01-08 LAB — CBC WITH DIFFERENTIAL/PLATELET
Basophils Absolute: 0 10*3/uL (ref 0.0–0.1)
Basophils Relative: 0.5 % (ref 0.0–3.0)
Eosinophils Absolute: 0.1 10*3/uL (ref 0.0–0.7)
Eosinophils Relative: 1.8 % (ref 0.0–5.0)
HCT: 36.2 % (ref 36.0–46.0)
Hemoglobin: 11.6 g/dL — ABNORMAL LOW (ref 12.0–15.0)
Lymphocytes Relative: 21.1 % (ref 12.0–46.0)
Lymphs Abs: 1.4 10*3/uL (ref 0.7–4.0)
MCHC: 32.1 g/dL (ref 30.0–36.0)
MCV: 79.2 fl (ref 78.0–100.0)
Monocytes Absolute: 0.7 10*3/uL (ref 0.1–1.0)
Monocytes Relative: 9.9 % (ref 3.0–12.0)
Neutro Abs: 4.4 10*3/uL (ref 1.4–7.7)
Neutrophils Relative %: 66.7 % (ref 43.0–77.0)
Platelets: 390 10*3/uL (ref 150.0–400.0)
RBC: 4.57 Mil/uL (ref 3.87–5.11)
RDW: 16.8 % — ABNORMAL HIGH (ref 11.5–15.5)
WBC: 6.6 10*3/uL (ref 4.0–10.5)

## 2018-01-08 LAB — T4, FREE: Free T4: 1 ng/dL (ref 0.60–1.60)

## 2018-01-08 LAB — TSH: TSH: 0.14 u[IU]/mL — ABNORMAL LOW (ref 0.35–4.50)

## 2018-01-09 DIAGNOSIS — M479 Spondylosis, unspecified: Secondary | ICD-10-CM | POA: Diagnosis not present

## 2018-01-09 DIAGNOSIS — M19011 Primary osteoarthritis, right shoulder: Secondary | ICD-10-CM | POA: Diagnosis not present

## 2018-01-09 DIAGNOSIS — I5032 Chronic diastolic (congestive) heart failure: Secondary | ICD-10-CM | POA: Diagnosis not present

## 2018-01-09 DIAGNOSIS — M5030 Other cervical disc degeneration, unspecified cervical region: Secondary | ICD-10-CM | POA: Diagnosis not present

## 2018-01-09 DIAGNOSIS — I110 Hypertensive heart disease with heart failure: Secondary | ICD-10-CM | POA: Diagnosis not present

## 2018-01-09 DIAGNOSIS — M1612 Unilateral primary osteoarthritis, left hip: Secondary | ICD-10-CM | POA: Diagnosis not present

## 2018-01-09 LAB — IRON,TIBC AND FERRITIN PANEL
%SAT: 8 % (calc) — ABNORMAL LOW (ref 16–45)
Ferritin: 51 ng/mL (ref 16–288)
Iron: 25 ug/dL — ABNORMAL LOW (ref 45–160)
TIBC: 322 mcg/dL (calc) (ref 250–450)

## 2018-01-10 ENCOUNTER — Encounter: Payer: Self-pay | Admitting: *Deleted

## 2018-01-10 ENCOUNTER — Telehealth: Payer: Self-pay

## 2018-01-10 DIAGNOSIS — M1612 Unilateral primary osteoarthritis, left hip: Secondary | ICD-10-CM | POA: Diagnosis not present

## 2018-01-10 DIAGNOSIS — M19011 Primary osteoarthritis, right shoulder: Secondary | ICD-10-CM | POA: Diagnosis not present

## 2018-01-10 DIAGNOSIS — M5030 Other cervical disc degeneration, unspecified cervical region: Secondary | ICD-10-CM | POA: Diagnosis not present

## 2018-01-10 DIAGNOSIS — M479 Spondylosis, unspecified: Secondary | ICD-10-CM | POA: Diagnosis not present

## 2018-01-10 DIAGNOSIS — I110 Hypertensive heart disease with heart failure: Secondary | ICD-10-CM | POA: Diagnosis not present

## 2018-01-10 DIAGNOSIS — I5032 Chronic diastolic (congestive) heart failure: Secondary | ICD-10-CM | POA: Diagnosis not present

## 2018-01-10 NOTE — Telephone Encounter (Signed)
Received monitor report that shows- artifact with fainted/fell unconfirmed on 01/09/18 at 22:03 EST. Called patient. Patient stated she did not faint last night and she feels fine this morning. Will have Dr. Acie Fredrickson to review. Dr. Acie Fredrickson recommend monitor tech see patient again to get wiring changed out, because nothing is being picked up. Talked to monitor tech. Patient's monitor is just a patch and patient would need to make sure she has the patch on. Called patient back. Patient states she is not wearing a patch and she is at a nursing home. Patient stated that she cannot answer all my questions and to call [facility]. Called [facility] left message for someone to call back about patient.

## 2018-01-13 DIAGNOSIS — M479 Spondylosis, unspecified: Secondary | ICD-10-CM | POA: Diagnosis not present

## 2018-01-13 DIAGNOSIS — M5030 Other cervical disc degeneration, unspecified cervical region: Secondary | ICD-10-CM | POA: Diagnosis not present

## 2018-01-13 DIAGNOSIS — M1612 Unilateral primary osteoarthritis, left hip: Secondary | ICD-10-CM | POA: Diagnosis not present

## 2018-01-13 DIAGNOSIS — I110 Hypertensive heart disease with heart failure: Secondary | ICD-10-CM | POA: Diagnosis not present

## 2018-01-13 DIAGNOSIS — I5032 Chronic diastolic (congestive) heart failure: Secondary | ICD-10-CM | POA: Diagnosis not present

## 2018-01-13 DIAGNOSIS — M19011 Primary osteoarthritis, right shoulder: Secondary | ICD-10-CM | POA: Diagnosis not present

## 2018-01-13 NOTE — Telephone Encounter (Signed)
Dee, RN w/ [facility] received the updated medication list but needs it to be signed confirming which medications need to be discontinued. Please faxt to [place]: Karena Addison @ fax# [phone]

## 2018-01-13 NOTE — Telephone Encounter (Signed)
Discussed with Shelly monitor tech and pt has a biotel event kit and shows normal readings yesterday and day before Appears pt has figured out how to use monitor .[name]

## 2018-01-14 ENCOUNTER — Inpatient Hospital Stay: Payer: Medicare Other | Admitting: Internal Medicine

## 2018-01-14 ENCOUNTER — Ambulatory Visit: Payer: Medicare Other | Admitting: Internal Medicine

## 2018-01-14 NOTE — Telephone Encounter (Signed)
Ok to [employer] the meds    ( never prescribed th restasis )

## 2018-01-15 DIAGNOSIS — M19011 Primary osteoarthritis, right shoulder: Secondary | ICD-10-CM | POA: Diagnosis not present

## 2018-01-15 DIAGNOSIS — M1612 Unilateral primary osteoarthritis, left hip: Secondary | ICD-10-CM | POA: Diagnosis not present

## 2018-01-15 DIAGNOSIS — M5030 Other cervical disc degeneration, unspecified cervical region: Secondary | ICD-10-CM | POA: Diagnosis not present

## 2018-01-15 DIAGNOSIS — I5032 Chronic diastolic (congestive) heart failure: Secondary | ICD-10-CM | POA: Diagnosis not present

## 2018-01-15 DIAGNOSIS — I110 Hypertensive heart disease with heart failure: Secondary | ICD-10-CM | POA: Diagnosis not present

## 2018-01-15 DIAGNOSIS — M479 Spondylosis, unspecified: Secondary | ICD-10-CM | POA: Diagnosis not present

## 2018-01-15 NOTE — Telephone Encounter (Signed)
Updated medication list updated, faxed, and confirmed.

## 2018-01-17 ENCOUNTER — Telehealth: Payer: Self-pay | Admitting: *Deleted

## 2018-01-17 NOTE — Telephone Encounter (Signed)
Pt daughter calling requesting that we fax back form from [facility] today.  Re: Medication cancellation orders.   These have been placed in your red folder. Please advise Dr Regis Bill, thanks.    Copied from [place] [id number]. Topic: General - Other >> Jan 16, 2018  3:55 PM Leward Quan A wrote: Reason for CRM: Patients daughter called to request information on a form that was brought in to be completed and faxed back to the facility where her mom is living. She request a call back from Dr Regis Bill assistant also did not elaborate on what the form was for. Ph# [phone]

## 2018-01-18 DIAGNOSIS — M1612 Unilateral primary osteoarthritis, left hip: Secondary | ICD-10-CM | POA: Diagnosis not present

## 2018-01-20 DIAGNOSIS — K760 Fatty (change of) liver, not elsewhere classified: Secondary | ICD-10-CM

## 2018-01-20 DIAGNOSIS — M479 Spondylosis, unspecified: Secondary | ICD-10-CM | POA: Diagnosis not present

## 2018-01-20 DIAGNOSIS — Z96641 Presence of right artificial hip joint: Secondary | ICD-10-CM

## 2018-01-20 DIAGNOSIS — Z9011 Acquired absence of right breast and nipple: Secondary | ICD-10-CM

## 2018-01-20 DIAGNOSIS — D509 Iron deficiency anemia, unspecified: Secondary | ICD-10-CM

## 2018-01-20 DIAGNOSIS — K5520 Angiodysplasia of colon without hemorrhage: Secondary | ICD-10-CM

## 2018-01-20 DIAGNOSIS — I110 Hypertensive heart disease with heart failure: Secondary | ICD-10-CM | POA: Diagnosis not present

## 2018-01-20 DIAGNOSIS — D649 Anemia, unspecified: Secondary | ICD-10-CM | POA: Diagnosis not present

## 2018-01-20 DIAGNOSIS — Z853 Personal history of malignant neoplasm of breast: Secondary | ICD-10-CM

## 2018-01-20 DIAGNOSIS — E559 Vitamin D deficiency, unspecified: Secondary | ICD-10-CM

## 2018-01-20 DIAGNOSIS — Z87891 Personal history of nicotine dependence: Secondary | ICD-10-CM

## 2018-01-20 DIAGNOSIS — M1612 Unilateral primary osteoarthritis, left hip: Secondary | ICD-10-CM | POA: Diagnosis not present

## 2018-01-20 DIAGNOSIS — K8020 Calculus of gallbladder without cholecystitis without obstruction: Secondary | ICD-10-CM | POA: Diagnosis not present

## 2018-01-20 DIAGNOSIS — M5030 Other cervical disc degeneration, unspecified cervical region: Secondary | ICD-10-CM | POA: Diagnosis not present

## 2018-01-20 DIAGNOSIS — Z9181 History of falling: Secondary | ICD-10-CM

## 2018-01-20 DIAGNOSIS — E079 Disorder of thyroid, unspecified: Secondary | ICD-10-CM

## 2018-01-20 DIAGNOSIS — N3281 Overactive bladder: Secondary | ICD-10-CM

## 2018-01-20 DIAGNOSIS — I5032 Chronic diastolic (congestive) heart failure: Secondary | ICD-10-CM

## 2018-01-20 DIAGNOSIS — M19011 Primary osteoarthritis, right shoulder: Secondary | ICD-10-CM | POA: Diagnosis not present

## 2018-01-20 DIAGNOSIS — K5730 Diverticulosis of large intestine without perforation or abscess without bleeding: Secondary | ICD-10-CM | POA: Diagnosis not present

## 2018-01-20 DIAGNOSIS — Q2733 Arteriovenous malformation of digestive system vessel: Secondary | ICD-10-CM | POA: Diagnosis not present

## 2018-01-20 DIAGNOSIS — J45909 Unspecified asthma, uncomplicated: Secondary | ICD-10-CM

## 2018-01-20 NOTE — Telephone Encounter (Signed)
Gave you a few forms signed  this am .  Not sure if that is all

## 2018-01-21 DIAGNOSIS — M1612 Unilateral primary osteoarthritis, left hip: Secondary | ICD-10-CM | POA: Diagnosis not present

## 2018-01-21 DIAGNOSIS — M5030 Other cervical disc degeneration, unspecified cervical region: Secondary | ICD-10-CM | POA: Diagnosis not present

## 2018-01-21 DIAGNOSIS — M19011 Primary osteoarthritis, right shoulder: Secondary | ICD-10-CM | POA: Diagnosis not present

## 2018-01-21 DIAGNOSIS — I5032 Chronic diastolic (congestive) heart failure: Secondary | ICD-10-CM | POA: Diagnosis not present

## 2018-01-21 DIAGNOSIS — I110 Hypertensive heart disease with heart failure: Secondary | ICD-10-CM | POA: Diagnosis not present

## 2018-01-21 DIAGNOSIS — M479 Spondylosis, unspecified: Secondary | ICD-10-CM | POA: Diagnosis not present

## 2018-01-21 NOTE — Telephone Encounter (Signed)
Pt aware that forms are being faxed back.  Nothing further needed.

## 2018-01-21 NOTE — Telephone Encounter (Signed)
Forms placed to be faxed.  Nothing further needed.

## 2018-01-22 DIAGNOSIS — M5030 Other cervical disc degeneration, unspecified cervical region: Secondary | ICD-10-CM | POA: Diagnosis not present

## 2018-01-22 DIAGNOSIS — M479 Spondylosis, unspecified: Secondary | ICD-10-CM | POA: Diagnosis not present

## 2018-01-22 DIAGNOSIS — M1612 Unilateral primary osteoarthritis, left hip: Secondary | ICD-10-CM | POA: Diagnosis not present

## 2018-01-22 DIAGNOSIS — I110 Hypertensive heart disease with heart failure: Secondary | ICD-10-CM | POA: Diagnosis not present

## 2018-01-22 DIAGNOSIS — I5032 Chronic diastolic (congestive) heart failure: Secondary | ICD-10-CM | POA: Diagnosis not present

## 2018-01-22 DIAGNOSIS — M19011 Primary osteoarthritis, right shoulder: Secondary | ICD-10-CM | POA: Diagnosis not present

## 2018-01-23 DIAGNOSIS — I5032 Chronic diastolic (congestive) heart failure: Secondary | ICD-10-CM | POA: Diagnosis not present

## 2018-01-23 DIAGNOSIS — I110 Hypertensive heart disease with heart failure: Secondary | ICD-10-CM | POA: Diagnosis not present

## 2018-01-23 DIAGNOSIS — M479 Spondylosis, unspecified: Secondary | ICD-10-CM | POA: Diagnosis not present

## 2018-01-23 DIAGNOSIS — M1612 Unilateral primary osteoarthritis, left hip: Secondary | ICD-10-CM | POA: Diagnosis not present

## 2018-01-23 DIAGNOSIS — M5030 Other cervical disc degeneration, unspecified cervical region: Secondary | ICD-10-CM | POA: Diagnosis not present

## 2018-01-23 DIAGNOSIS — M19011 Primary osteoarthritis, right shoulder: Secondary | ICD-10-CM | POA: Diagnosis not present

## 2018-01-24 ENCOUNTER — Ambulatory Visit (INDEPENDENT_AMBULATORY_CARE_PROVIDER_SITE_OTHER): Payer: Medicare Other | Admitting: Internal Medicine

## 2018-01-24 VITALS — BP 110/58 | HR 79 | Temp 97.4°F | Wt 139.2 lb

## 2018-01-24 DIAGNOSIS — J3089 Other allergic rhinitis: Secondary | ICD-10-CM | POA: Diagnosis not present

## 2018-01-24 DIAGNOSIS — R7989 Other specified abnormal findings of blood chemistry: Secondary | ICD-10-CM | POA: Diagnosis not present

## 2018-01-24 DIAGNOSIS — M1610 Unilateral primary osteoarthritis, unspecified hip: Secondary | ICD-10-CM | POA: Diagnosis not present

## 2018-01-24 DIAGNOSIS — I5032 Chronic diastolic (congestive) heart failure: Secondary | ICD-10-CM | POA: Diagnosis not present

## 2018-01-24 DIAGNOSIS — J301 Allergic rhinitis due to pollen: Secondary | ICD-10-CM | POA: Diagnosis not present

## 2018-01-24 DIAGNOSIS — Z9181 History of falling: Secondary | ICD-10-CM | POA: Diagnosis not present

## 2018-01-24 DIAGNOSIS — I10 Essential (primary) hypertension: Secondary | ICD-10-CM

## 2018-01-24 DIAGNOSIS — E0590 Thyrotoxicosis, unspecified without thyrotoxic crisis or storm: Secondary | ICD-10-CM

## 2018-01-24 DIAGNOSIS — D509 Iron deficiency anemia, unspecified: Secondary | ICD-10-CM

## 2018-01-24 NOTE — Progress Notes (Signed)
Chief Complaint  Patient presents with  . Follow-up    Discuss labs, BP meds and Upcoming surgery for hip    HPI: [name] 84 y.o. come in w daughter  For  Lab  Forms and   Plan for  Hip THA left . Left hip surgery  Dr Mayer Camel.  Planned for January   .   Has upcoming cards eval   attempted event monitor didn't happen   Needs updated  Med list   Allegra prn . And  No potassium  rx  Had been using OTCs   Swelling legs no different  BP has been ok  No syncope or dizziness now   Astma stable.  Has  Form from [place]  To get ? Help benefits  At home    va form 4 pages  ROS: See pertinent positives and negatives per HPI.  Past Medical History:  Diagnosis Date  . Allergy   . Anemia 2011   Since last visit she has had a Gi evaluation for newer onset Iron deficiiency anemia that was felt secondary to avm in duodenum rx with laser ablation.  . Arthritis    in back  . Asthma   . Cancer ([place])    rt breast  . GERD (gastroesophageal reflux disease)   . Hyperlipidemia   . Hypertension     Family History  Problem Relation Age of Onset  . Cancer Mother        breast and ovarian    Social History   Socioeconomic History  . Marital status: Widowed    Spouse name: Not on file  . Number of children: Not on file  . Years of education: Not on file  . Highest education level: Not on file  Occupational History  . Not on file  Social Needs  . Financial resource strain: Not on file  . Food insecurity:    Worry: Not on file    Inability: Not on file  . Transportation needs:    Medical: Not on file    Non-medical: Not on file  Tobacco Use  . Smoking status: Former Smoker    Packs/day: 0.25    Years: 4.00    Pack years: 1.00    Types: Cigarettes    Last attempt to quit: 03/05/1968    Years since quitting: 49.9  . Smokeless tobacco: Never Used  Substance and Sexual Activity  . Alcohol use: No  . Drug use: No  . Sexual activity: Not on file  Lifestyle  . Physical  activity:    Days per week: Not on file    Minutes per session: Not on file  . Stress: Not on file  Relationships  . Social connections:    Talks on phone: Not on file    Gets together: Not on file    Attends religious service: Not on file    Active member of club or organization: Not on file    Attends meetings of clubs or organizations: Not on file    Relationship status: Not on file  Other Topics Concern  . Not on file  Social History Narrative   Widowed 2001    Ex smoker   HHof 1    Active in church and volunteer activities    [place] a few times per week   Swimming 2 x per week.    Tobacco 49=56 less than 1ppd.              Outpatient Medications  Prior to Visit  Medication Sig Dispense Refill  . amLODipine (NORVASC) 10 MG tablet TAKE ONE TABLET BY MOUTH DAILY 90 tablet 0  . budesonide-formoterol (SYMBICORT) 160-4.5 MCG/ACT inhaler Inhale 2 puffs into the lungs 2 (two) times daily as needed (Shortness of breath).     . fexofenadine (ALLEGRA) 180 MG tablet Take 180 mg by mouth daily as needed for allergies or rhinitis.    Marland Kitchen lisinopril-hydrochlorothiazide (PRINZIDE,ZESTORETIC) 20-12.5 MG tablet TAKE ONE TABLET BY MOUTH TWO TIMES A DAY 180 tablet 1  . POTASSIUM CHLORIDE PO Take by mouth.    Marland Kitchen KLOR-CON M20 20 MEQ tablet TAKE 1 TABLET TWICE A DAY 180 tablet 0   No facility-administered medications prior to visit.    EXAM:  BP (!) 110/58 (BP Location: Right Arm, Patient Position: Sitting, Cuff Size: Normal)   Pulse 79   Temp (!) 97.4 F (36.3 C) (Oral)   Wt 139 lb 3.2 oz (63.1 kg)   BMI 28.11 kg/m   Body mass index is 28.11 kg/m.  GENERAL: vitals reviewed and listed above, alert, oriented, appears well hydrated and in no acute distress walker looks stable  And alert  HEENT: atraumatic, conjunctiva  clear, no obvious abnormalities on inspection of external nose and ears NECK: no obvious masses on inspection palpation  LUNGS: clear to auscultation bilaterally, no  wheezes, rales or rhonchi, good air movement CV: HRRR, no clubbing cyanosis 2+ edema    nl cap refill  2/6 sem upper sternal border  MS: moves all extremities   Gait with walker  PSYCH: pleasant and cooperative, no obvious depression or anxiety Lab Results  Component Value Date   WBC 6.6 01/08/2018   HGB 11.6 (L) 01/08/2018   HCT 36.2 01/08/2018   PLT 390.0 01/08/2018   GLUCOSE 89 01/08/2018   CHOL 260 (H) 04/30/2017   TRIG 127.0 04/30/2017   HDL 72.00 04/30/2017   LDLCALC 163 (H) 04/30/2017   ALT 26 12/24/2017   AST 55 (H) 12/24/2017   NA 140 01/08/2018   K 3.9 01/08/2018   CL 103 01/08/2018   CREATININE 0.57 01/08/2018   BUN 17 01/08/2018   CO2 28 01/08/2018   TSH 0.14 (L) 01/08/2018   HGBA1C 5.6 04/30/2017   BP Readings from Last 3 Encounters:  01/24/18 (!) 110/58  12/30/17 110/62  12/26/17 117/63   Wt Readings from Last 3 Encounters:  01/24/18 139 lb 3.2 oz (63.1 kg)  12/30/17 140 lb (63.5 kg)  10/18/17 140 lb 12.8 oz (63.9 kg)   reviewed lab  Results  ASSESSMENT AND PLAN:  Discussed the following assessment and plan:  Low TSH level - Plan: Ambulatory referral to Endocrinology  Chronic diastolic CHF (congestive heart failure) (HCC)  Subclinical hyperthyroidism  Iron deficiency anemia, unspecified iron deficiency anemia type  Hip arthritis  Well-controlled hypertension  History of fall patient states that  Taking otc potassium  And apparently had  Been enough to maintain  So will ok this  And then  Plan bmp in future next visit?  advise endocrine  Consult    ( she had declined in past)  But definitely pre surgery. Will need pre  Surgical cards eval .  D may send in info on getting an earlier appt. Can take iron every other day  Anemia is  Improving   -Patient advised to return or notify health care team  if  new concerns arise. To review  Form   For help from [place] and if related to surgery may  need  Ortho to  Hep complete   Patient Instructions  Ok to  discontinue the potassium  rx  And can do the otc supplement instead.   allergra  as needed.   Plan endocrinology referral.   IF  Blood pressure is getting low  can decrease norvasc to 5 mg. If needed.   Get me the preview of the form   . Needs pre and post op are different.  Can take iron every other day  For the anemia with is a lot better .   Let us know cardiology  Referral info  .  Should have  evaluation pre operative .    Plan follow up. dependin onabove after card and endo evaluation.    Standley Brooking. Brezlyn Manrique M.D.

## 2018-01-24 NOTE — Patient Instructions (Addendum)
Ok to discontinue the potassium  rx  And can do the otc supplement instead.   allergra  as needed.   Plan endocrinology referral.   IF  Blood pressure is getting low  can decrease norvasc to 5 mg. If needed.   Get me the preview of the form   . Needs pre and post op are different.  Can take iron every other day  For the anemia with is a lot better .   Let us know cardiology  Referral info  .  Should have  evaluation pre operative .    Plan follow up. dependin onabove after card and endo evaluation.    Marland Kitchen

## 2018-01-27 DIAGNOSIS — I110 Hypertensive heart disease with heart failure: Secondary | ICD-10-CM | POA: Diagnosis not present

## 2018-01-27 DIAGNOSIS — M479 Spondylosis, unspecified: Secondary | ICD-10-CM | POA: Diagnosis not present

## 2018-01-27 DIAGNOSIS — I5032 Chronic diastolic (congestive) heart failure: Secondary | ICD-10-CM | POA: Diagnosis not present

## 2018-01-27 DIAGNOSIS — M1612 Unilateral primary osteoarthritis, left hip: Secondary | ICD-10-CM | POA: Diagnosis not present

## 2018-01-27 DIAGNOSIS — M5030 Other cervical disc degeneration, unspecified cervical region: Secondary | ICD-10-CM | POA: Diagnosis not present

## 2018-01-27 DIAGNOSIS — M19011 Primary osteoarthritis, right shoulder: Secondary | ICD-10-CM | POA: Diagnosis not present

## 2018-01-28 DIAGNOSIS — I5032 Chronic diastolic (congestive) heart failure: Secondary | ICD-10-CM | POA: Diagnosis not present

## 2018-01-28 DIAGNOSIS — M5030 Other cervical disc degeneration, unspecified cervical region: Secondary | ICD-10-CM | POA: Diagnosis not present

## 2018-01-28 DIAGNOSIS — M19011 Primary osteoarthritis, right shoulder: Secondary | ICD-10-CM | POA: Diagnosis not present

## 2018-01-28 DIAGNOSIS — I110 Hypertensive heart disease with heart failure: Secondary | ICD-10-CM | POA: Diagnosis not present

## 2018-01-28 DIAGNOSIS — M1612 Unilateral primary osteoarthritis, left hip: Secondary | ICD-10-CM | POA: Diagnosis not present

## 2018-01-28 DIAGNOSIS — M479 Spondylosis, unspecified: Secondary | ICD-10-CM | POA: Diagnosis not present

## 2018-01-29 DIAGNOSIS — J3089 Other allergic rhinitis: Secondary | ICD-10-CM | POA: Diagnosis not present

## 2018-01-29 DIAGNOSIS — J301 Allergic rhinitis due to pollen: Secondary | ICD-10-CM | POA: Diagnosis not present

## 2018-02-03 ENCOUNTER — Encounter: Payer: Self-pay | Admitting: Internal Medicine

## 2018-02-03 DIAGNOSIS — I5032 Chronic diastolic (congestive) heart failure: Secondary | ICD-10-CM | POA: Diagnosis not present

## 2018-02-03 DIAGNOSIS — I110 Hypertensive heart disease with heart failure: Secondary | ICD-10-CM | POA: Diagnosis not present

## 2018-02-03 DIAGNOSIS — M1612 Unilateral primary osteoarthritis, left hip: Secondary | ICD-10-CM | POA: Diagnosis not present

## 2018-02-03 DIAGNOSIS — M19011 Primary osteoarthritis, right shoulder: Secondary | ICD-10-CM | POA: Diagnosis not present

## 2018-02-03 DIAGNOSIS — M479 Spondylosis, unspecified: Secondary | ICD-10-CM | POA: Diagnosis not present

## 2018-02-03 DIAGNOSIS — M5030 Other cervical disc degeneration, unspecified cervical region: Secondary | ICD-10-CM | POA: Diagnosis not present

## 2018-02-05 ENCOUNTER — Other Ambulatory Visit: Payer: Self-pay | Admitting: Internal Medicine

## 2018-02-05 MED ORDER — LISINOPRIL-HYDROCHLOROTHIAZIDE 20-12.5 MG PO TABS: 1.0000 | ORAL_TABLET | Freq: Two times a day (BID) | ORAL | 1 refills | Status: DC

## 2018-02-05 NOTE — Telephone Encounter (Signed)
Copied from [place] [id number]. Topic: Quick Communication - Rx Refill/Question >> Feb 05, 2018 12:37 PM Ahmed Prima L wrote: Medication: lisinopril-hydrochlorothiazide (PRINZIDE,ZESTORETIC) 20-12.5 MG tablet  Has the patient contacted their pharmacy? No,patient states she is in a senior living and wants it to be transferred from [facility] to [place] so it is easier for her daughter to pick it up (Agent: If no, request that the patient contact the pharmacy for the refill.) (Agent: If yes, when and what did the pharmacy advise?)  Preferred Pharmacy (with phone number or street name): [facility], [place] [address] [id number]    Agent: Please be advised that RX refills may take up to 3 business days. We ask that you follow-up with your pharmacy.

## 2018-02-05 NOTE — Telephone Encounter (Signed)
Requested Prescriptions  Pending Prescriptions Disp Refills  . lisinopril-hydrochlorothiazide (PRINZIDE,ZESTORETIC) 20-12.5 MG tablet 180 tablet 1    Sig: Take 1 tablet by mouth 2 (two) times daily.     Cardiovascular:  ACEI + Diuretic Combos Passed - 02/05/2018 12:55 PM      Passed - Na in normal range and within 180 days    Sodium  Date Value Ref Range Status  01/08/2018 140 135 - 145 mEq/L Final         Passed - K in normal range and within 180 days    Potassium  Date Value Ref Range Status  01/08/2018 3.9 3.5 - 5.1 mEq/L Final         Passed - Cr in normal range and within 180 days    Creatinine, Ser  Date Value Ref Range Status  01/08/2018 0.57 0.40 - 1.20 mg/dL Final         Passed - Ca in normal range and within 180 days    Calcium  Date Value Ref Range Status  01/08/2018 9.7 8.4 - 10.5 mg/dL Final         Passed - Patient is not pregnant      Passed - Last BP in normal range    BP Readings from Last 1 Encounters:  01/24/18 (!) 110/58         Passed - Valid encounter within last 6 months    Recent Outpatient Visits          1 week ago Low TSH level   [facility], Standley Brooking, MD   1 month ago Hospital discharge follow-up   [place] [facility] [place], [place]   3 months ago Edema, unspecified type   [facility], Standley Brooking, MD   9 months ago Well-controlled hypertension   [facility], Standley Brooking, MD   1 year ago Elevated blood sugar level   [place] [facility], Standley Brooking, MD      Future Appointments            In 2 months [place], Standley Brooking, MD [place] at [place], [facility]

## 2018-02-07 ENCOUNTER — Telehealth: Payer: Self-pay | Admitting: Internal Medicine

## 2018-02-07 DIAGNOSIS — I110 Hypertensive heart disease with heart failure: Secondary | ICD-10-CM | POA: Diagnosis not present

## 2018-02-07 DIAGNOSIS — M479 Spondylosis, unspecified: Secondary | ICD-10-CM | POA: Diagnosis not present

## 2018-02-07 DIAGNOSIS — I5032 Chronic diastolic (congestive) heart failure: Secondary | ICD-10-CM | POA: Diagnosis not present

## 2018-02-07 DIAGNOSIS — M19011 Primary osteoarthritis, right shoulder: Secondary | ICD-10-CM | POA: Diagnosis not present

## 2018-02-07 DIAGNOSIS — R55 Syncope and collapse: Secondary | ICD-10-CM | POA: Diagnosis not present

## 2018-02-07 DIAGNOSIS — M5030 Other cervical disc degeneration, unspecified cervical region: Secondary | ICD-10-CM | POA: Diagnosis not present

## 2018-02-07 DIAGNOSIS — M1612 Unilateral primary osteoarthritis, left hip: Secondary | ICD-10-CM | POA: Diagnosis not present

## 2018-02-07 NOTE — Telephone Encounter (Signed)
Dr Regis Bill has forms in her red folder - she is reviewing.

## 2018-02-07 NOTE — Telephone Encounter (Signed)
Copied from [place] [id number]. Topic: General - Other >> Feb 07, 2018  8:56 AM Keene Breath wrote: Reason for CRM: Patti from [facility] called to verify if the Pre Authorization forms and individual Service Plan addendum were received for the patient.  She stated that it was faxed on 01/29/18 and she has not heard anything as of yet.  Precious Bard will also be re-faxing the forms today.  Please call her when received to get an update.  CB# [phone]

## 2018-02-07 NOTE — Telephone Encounter (Signed)
[name] aware that forms are being faxed back today.  Forms placed to fax.  Nothing further needed.

## 2018-02-07 NOTE — Telephone Encounter (Signed)
Form signed.

## 2018-02-11 DIAGNOSIS — I110 Hypertensive heart disease with heart failure: Secondary | ICD-10-CM | POA: Diagnosis not present

## 2018-02-11 DIAGNOSIS — M479 Spondylosis, unspecified: Secondary | ICD-10-CM | POA: Diagnosis not present

## 2018-02-11 DIAGNOSIS — M5030 Other cervical disc degeneration, unspecified cervical region: Secondary | ICD-10-CM | POA: Diagnosis not present

## 2018-02-11 DIAGNOSIS — I5032 Chronic diastolic (congestive) heart failure: Secondary | ICD-10-CM | POA: Diagnosis not present

## 2018-02-11 DIAGNOSIS — M19011 Primary osteoarthritis, right shoulder: Secondary | ICD-10-CM | POA: Diagnosis not present

## 2018-02-11 DIAGNOSIS — M1612 Unilateral primary osteoarthritis, left hip: Secondary | ICD-10-CM | POA: Diagnosis not present

## 2018-02-14 DIAGNOSIS — I110 Hypertensive heart disease with heart failure: Secondary | ICD-10-CM | POA: Diagnosis not present

## 2018-02-14 DIAGNOSIS — M19011 Primary osteoarthritis, right shoulder: Secondary | ICD-10-CM | POA: Diagnosis not present

## 2018-02-14 DIAGNOSIS — M5030 Other cervical disc degeneration, unspecified cervical region: Secondary | ICD-10-CM | POA: Diagnosis not present

## 2018-02-14 DIAGNOSIS — M479 Spondylosis, unspecified: Secondary | ICD-10-CM | POA: Diagnosis not present

## 2018-02-14 DIAGNOSIS — M1612 Unilateral primary osteoarthritis, left hip: Secondary | ICD-10-CM | POA: Diagnosis not present

## 2018-02-14 DIAGNOSIS — I5032 Chronic diastolic (congestive) heart failure: Secondary | ICD-10-CM | POA: Diagnosis not present

## 2018-02-17 ENCOUNTER — Other Ambulatory Visit: Payer: Self-pay | Admitting: Orthopedic Surgery

## 2018-02-18 DIAGNOSIS — M1612 Unilateral primary osteoarthritis, left hip: Secondary | ICD-10-CM | POA: Diagnosis not present

## 2018-02-18 DIAGNOSIS — I110 Hypertensive heart disease with heart failure: Secondary | ICD-10-CM | POA: Diagnosis not present

## 2018-02-18 DIAGNOSIS — M19011 Primary osteoarthritis, right shoulder: Secondary | ICD-10-CM | POA: Diagnosis not present

## 2018-02-18 DIAGNOSIS — I5032 Chronic diastolic (congestive) heart failure: Secondary | ICD-10-CM | POA: Diagnosis not present

## 2018-02-18 DIAGNOSIS — M5030 Other cervical disc degeneration, unspecified cervical region: Secondary | ICD-10-CM | POA: Diagnosis not present

## 2018-02-18 DIAGNOSIS — M479 Spondylosis, unspecified: Secondary | ICD-10-CM | POA: Diagnosis not present

## 2018-02-21 ENCOUNTER — Ambulatory Visit (INDEPENDENT_AMBULATORY_CARE_PROVIDER_SITE_OTHER): Payer: Medicare Other | Admitting: Internal Medicine

## 2018-02-21 ENCOUNTER — Encounter: Payer: Self-pay | Admitting: Internal Medicine

## 2018-02-21 ENCOUNTER — Encounter: Payer: Self-pay | Admitting: Cardiovascular Disease

## 2018-02-21 ENCOUNTER — Ambulatory Visit (INDEPENDENT_AMBULATORY_CARE_PROVIDER_SITE_OTHER): Payer: Medicare Other | Admitting: Cardiovascular Disease

## 2018-02-21 VITALS — BP 110/62 | HR 66 | Ht 59.0 in | Wt 139.8 lb

## 2018-02-21 VITALS — BP 124/64 | HR 80 | Ht 59.0 in | Wt 140.0 lb

## 2018-02-21 DIAGNOSIS — I10 Essential (primary) hypertension: Secondary | ICD-10-CM | POA: Diagnosis not present

## 2018-02-21 DIAGNOSIS — R55 Syncope and collapse: Secondary | ICD-10-CM | POA: Diagnosis not present

## 2018-02-21 DIAGNOSIS — I5032 Chronic diastolic (congestive) heart failure: Secondary | ICD-10-CM | POA: Diagnosis not present

## 2018-02-21 DIAGNOSIS — M1612 Unilateral primary osteoarthritis, left hip: Secondary | ICD-10-CM | POA: Diagnosis not present

## 2018-02-21 DIAGNOSIS — M19011 Primary osteoarthritis, right shoulder: Secondary | ICD-10-CM | POA: Diagnosis not present

## 2018-02-21 DIAGNOSIS — I110 Hypertensive heart disease with heart failure: Secondary | ICD-10-CM | POA: Diagnosis not present

## 2018-02-21 DIAGNOSIS — M479 Spondylosis, unspecified: Secondary | ICD-10-CM | POA: Diagnosis not present

## 2018-02-21 DIAGNOSIS — E0590 Thyrotoxicosis, unspecified without thyrotoxic crisis or storm: Secondary | ICD-10-CM | POA: Diagnosis not present

## 2018-02-21 DIAGNOSIS — R7989 Other specified abnormal findings of blood chemistry: Secondary | ICD-10-CM

## 2018-02-21 DIAGNOSIS — M5030 Other cervical disc degeneration, unspecified cervical region: Secondary | ICD-10-CM | POA: Diagnosis not present

## 2018-02-21 LAB — T4, FREE: Free T4: 0.72 ng/dL (ref 0.60–1.60)

## 2018-02-21 LAB — TSH: TSH: 0.11 u[IU]/mL — ABNORMAL LOW (ref 0.35–4.50)

## 2018-02-21 MED ORDER — AMLODIPINE BESYLATE 5 MG PO TABS: 5.0000 mg | ORAL_TABLET | Freq: Every day | ORAL | 3 refills | Status: DC

## 2018-02-21 NOTE — Patient Instructions (Signed)
Medication Instructions:  Your physician has recommended you make the following change in your medication:  DECREASE Amlodipine (Norvasc) to 5 mg once daily  If you need a refill on your cardiac medications before your next appointment, please call your pharmacy.   Lab work: None Ordered   Testing/Procedures: None Ordered   Follow-Up: At [facility], you and your health needs are our priority.  As part of our continuing mission to provide you with exceptional heart care, we have created designated Provider Care Teams.  These Care Teams include your primary Cardiologist (physician) and Advanced Practice Providers (APPs -  Physician Assistants and Nurse Practitioners) who all work together to provide you with the care you need, when you need it. You will need a follow up appointment in:  3 months.  Please call our office 2 months in advance to schedule this appointment.  You may see Dr. Acie Fredrickson or one of the following Advanced Practice Providers on your designated Care Team: Richardson Dopp, PA-C [place], [place] . Daune Perch, NP

## 2018-02-21 NOTE — Progress Notes (Signed)
Cardiology Office Note:    Date:  02/21/2018   ID:  [name], DOB 02/15/1931, MRN [id number]  PCP:  Burnis Medin, MD  Cardiologist:  No primary care provider on file.  Electrophysiologist:  None   Referring MD: Burnis Medin, MD   Chief Complaint  Patient presents with  . Loss of Consciousness     History of Present Illness:    Seen with [name],  ( [place])  [name] is a 84 y.o. female with a hx of  HTN and a episode of syncope several months ago.  She was admitted to the hospital.  Work-up there was unremarkable. She had a event monitor here which was essentially normal except for one 4 beat run of nonsustained VT.  She has done well recently   VS are stable today   Is on amlodpiine 10 mg a day   Past Medical History:  Diagnosis Date  . Allergy   . Anemia 2011   Since last visit she has had a Gi evaluation for newer onset Iron deficiiency anemia that was felt secondary to avm in duodenum rx with laser ablation.  . Arthritis    in back  . Asthma   . Cancer ([place])    rt breast  . GERD (gastroesophageal reflux disease)   . Hyperlipidemia   . Hypertension     Past Surgical History:  Procedure Laterality Date  . BREAST LUMPECTOMY  04/26/2010   right  . COLONOSCOPY WITH PROPOFOL N/A 09/14/2013   Procedure: COLONOSCOPY WITH PROPOFOL;  Surgeon: Inda Castle, MD;  Location: [facility] ENDOSCOPY;  Service: Endoscopy;  Laterality: N/A;  . HOT HEMOSTASIS N/A 09/14/2013   Procedure: HOT HEMOSTASIS (ARGON PLASMA COAGULATION/BICAP);  Surgeon: Inda Castle, MD;  Location: [facility];  Service: Endoscopy;  Laterality: N/A;  . LARYNGECTOMY  20-25 yrs ago  . PARTIAL HIP ARTHROPLASTY     rt    Current Medications: Current Meds  Medication Sig  . amLODipine (NORVASC) 10 MG tablet TAKE ONE TABLET BY MOUTH DAILY  . budesonide-formoterol (SYMBICORT) 160-4.5 MCG/ACT inhaler Inhale 2 puffs into the lungs 2 (two) times daily as needed (Shortness of breath).     . fexofenadine (ALLEGRA) 180 MG tablet Take 180 mg by mouth daily as needed for allergies or rhinitis.  Marland Kitchen lisinopril-hydrochlorothiazide (PRINZIDE,ZESTORETIC) 20-12.5 MG tablet Take 1 tablet by mouth 2 (two) times daily.     Allergies:   Patient has no known allergies.   Social History   Socioeconomic History  . Marital status: Widowed    Spouse name: Not on file  . Number of children: Not on file  . Years of education: Not on file  . Highest education level: Not on file  Occupational History  . Not on file  Social Needs  . Financial resource strain: Not on file  . Food insecurity:    Worry: Not on file    Inability: Not on file  . Transportation needs:    Medical: Not on file    Non-medical: Not on file  Tobacco Use  . Smoking status: Former Smoker    Packs/day: 0.25    Years: 4.00    Pack years: 1.00    Types: Cigarettes    Last attempt to quit: 03/05/1968    Years since quitting: 50.0  . Smokeless tobacco: Never Used  Substance and Sexual Activity  . Alcohol use: No  . Drug use: No  . Sexual activity: Not on file  Lifestyle  .  Physical activity:    Days per week: Not on file    Minutes per session: Not on file  . Stress: Not on file  Relationships  . Social connections:    Talks on phone: Not on file    Gets together: Not on file    Attends religious service: Not on file    Active member of club or organization: Not on file    Attends meetings of clubs or organizations: Not on file    Relationship status: Not on file  Other Topics Concern  . Not on file  Social History Narrative   Widowed 2001    Ex smoker   HHof 1    Active in church and volunteer activities    [place] a few times per week   Swimming 2 x per week.    Tobacco 49=56 less than 1ppd.               Family History: The patient's family history includes Cancer in her mother.  ROS:   Please see the history of present illness.     All other systems reviewed and are  negative.  EKGs/Labs/Other Studies Reviewed:    The following studies were reviewed today:   EKG:    Recent Labs: 04/30/2017: Magnesium 2.1 12/24/2017: ALT 26 01/08/2018: BUN 17; Creatinine, Ser 0.57; Hemoglobin 11.6; Platelets 390.0; Potassium 3.9; Sodium 140; TSH 0.14  Recent Lipid Panel    Component Value Date/Time   CHOL 260 (H) 04/30/2017 1051   TRIG 127.0 04/30/2017 1051   HDL 72.00 04/30/2017 1051   CHOLHDL 4 04/30/2017 1051   VLDL 25.4 04/30/2017 1051   LDLCALC 163 (H) 04/30/2017 1051    Physical Exam:    VS:  BP 124/64   Pulse 80   Ht 4\' 11"  (1.499 m)   Wt 140 lb (63.5 kg)   SpO2 99%   BMI 28.28 kg/m     Wt Readings from Last 3 Encounters:  02/21/18 140 lb (63.5 kg)  02/21/18 139 lb 12.8 oz (63.4 kg)  01/24/18 139 lb 3.2 oz (63.1 kg)     GEN:   Elderly female,   well developed in no acute distress HEENT: Normal NECK: No JVD; No carotid bruits LYMPHATICS: No lymphadenopathy CARDIAC:  RR , soft systolic murur  RESPIRATORY:  Clear to auscultation without rales, wheezing or rhonchi  ABDOMEN: Soft, non-tender, non-distended MUSCULOSKELETAL:  Trace - 1+ edema   SKIN: Warm and dry NEUROLOGIC:  Alert and oriented x 3 PSYCHIATRIC:  Normal affect   ASSESSMENT:    No diagnosis found. PLAN:    In order of problems listed above:  1. Syncope:   Ms. Fleet had an episode of syncope and was hospitalized several months ago.  Event monitor did not reveal any significant arrhythmias. Echocardiogram reveals normal left ventricular systolic function.  She has grade 1 diastolic dysfunction.   She has mild aortic sclerosis.  At this point I think she is very stable.  We will reduce the amlodipine to 5 mg a day.  This may help with her episodes of lightheadedness.  She  needs to have upcoming hip surgery.  She has normal LV function.  In my opinion, she is at low risk for her upcoming hip surgery.    Medication Adjustments/Labs and Tests Ordered: Current  medicines are reviewed at length with the patient today.  Concerns regarding medicines are outlined above.  No orders of the defined types were placed in this encounter.  No orders  of the defined types were placed in this encounter.   There are no Patient Instructions on file for this visit.   Signed, Mertie Moores, MD  02/21/2018 2:58 PM    [place]

## 2018-02-21 NOTE — Progress Notes (Signed)
Name: [name]  MRN/ DOB: [id number], [birth year]    Age/ Sex: 84 y.o., female    PCP: Burnis Medin, MD   Reason for Endocrinology Evaluation: Low TSH      Date of Initial Endocrinology Evaluation: 02/23/2018     HPI: [name] is a 84 y.o. female with a past medical history of low TSH, HTN,chornic CHF, osteopenia .  The patient presented for initial endocrinology clinic visit on 02/23/2018 for consultative assistance with her low TSH    In review of her records, she was noted to have intermittently low TSH since 2013 with normal FT4.   Today she is accompanied by her daughter . She denies any local neck symptoms.  She denies any unexplained weight loss, palpitations, diarrhea or heat intolerance. She also denies any anxiety or jittery sensations.   She denies any H xof cardiac arrhythmia or osteoporosis   She has no FH of thyroid disease.   HISTORY:  Past Medical History:  Past Medical History:  Diagnosis Date  . Allergy   . Anemia 2011   Since last visit she has had a Gi evaluation for newer onset Iron deficiiency anemia that was felt secondary to avm in duodenum rx with laser ablation.  . Arthritis    in back  . Asthma   . Cancer ([place])    rt breast  . GERD (gastroesophageal reflux disease)   . Hyperlipidemia   . Hypertension    Past Surgical History:  Past Surgical History:  Procedure Laterality Date  . BREAST LUMPECTOMY  04/26/2010   right  . COLONOSCOPY WITH PROPOFOL N/A 09/14/2013   Procedure: COLONOSCOPY WITH PROPOFOL;  Surgeon: Inda Castle, MD;  Location: [facility];  Service: Endoscopy;  Laterality: N/A;  . HOT HEMOSTASIS N/A 09/14/2013   Procedure: HOT HEMOSTASIS (ARGON PLASMA COAGULATION/BICAP);  Surgeon: Inda Castle, MD;  Location: [facility];  Service: Endoscopy;  Laterality: N/A;  . LARYNGECTOMY  20-25 yrs ago  . PARTIAL HIP ARTHROPLASTY     rt      Social History:  reports that she quit smoking about 50 years ago.  Her smoking use included cigarettes. She has a 1.00 pack-year smoking history. She has never used smokeless tobacco. She reports that she does not drink alcohol or use drugs.  Family History: family history includes Cancer in her mother.   HOME MEDICATIONS: Current Outpatient Medications on File Prior to Visit  Medication Sig Dispense Refill  . budesonide-formoterol (SYMBICORT) 160-4.5 MCG/ACT inhaler Inhale 2 puffs into the lungs 2 (two) times daily as needed (Shortness of breath).     . fexofenadine (ALLEGRA) 180 MG tablet Take 180 mg by mouth daily as needed for allergies or rhinitis.    Marland Kitchen lisinopril-hydrochlorothiazide (PRINZIDE,ZESTORETIC) 20-12.5 MG tablet Take 1 tablet by mouth 2 (two) times daily. 180 tablet 1  . amLODipine (NORVASC) 5 MG tablet Take 1 tablet (5 mg total) by mouth daily. 90 tablet 3   No current facility-administered medications on file prior to visit.       REVIEW OF SYSTEMS: A comprehensive ROS was conducted with the patient and is negative except as per HPI and below:  Review of Systems  Constitutional: Positive for weight loss.       Intentional   HENT: Negative for congestion and sore throat.   Eyes: Negative for blurred vision and photophobia.  Respiratory: Negative for cough and shortness of breath.   Cardiovascular: Negative for chest pain and palpitations.  Gastrointestinal: Negative for diarrhea and nausea.  Genitourinary: Negative for frequency.  Musculoskeletal: Negative for neck pain.  Neurological: Negative for tingling and tremors.  Endo/Heme/Allergies: Negative for polydipsia.  Psychiatric/Behavioral: Negative for depression. The patient is not nervous/anxious.        OBJECTIVE:  VS: BP 110/62 (BP Location: Left Arm, Patient Position: Sitting, Cuff Size: Normal)   Pulse 66   Ht 4\' 11"  (1.499 m)   Wt 139 lb 12.8 oz (63.4 kg)   SpO2 97%   BMI 28.24 kg/m    Wt Readings from Last 3 Encounters:  02/21/18 140 lb (63.5 kg)  02/21/18 139  lb 12.8 oz (63.4 kg)  01/24/18 139 lb 3.2 oz (63.1 kg)     EXAM: General: Pt appears well and is in NAD, ambulates with a walker  Hydration: Well-hydrated with moist mucous membranes and good skin turgor  Eyes: External eye exam normal without stare, lid lag or exophthalmos.  EOM intact.  PERRL.  Ears, Nose, Throat: Hearing: Grossly intact bilaterally Dental: Good dentition  Throat: Clear without mass, erythema or exudate  Neck: General: Supple without adenopathy. Thyroid: Thyroid size normal.  No goiter or nodules appreciated. No thyroid bruit.  Lungs: Clear with good BS bilat with no rales, rhonchi, or wheezes  Heart: Auscultation: RRR.  Abdomen: Normoactive bowel sounds, soft, nontender, without masses or organomegaly palpable  Extremities:  BL LE: No pretibial edema normal ROM and strength.  Skin: Hair: Texture and amount normal with gender appropriate distribution Skin Inspection: No rashes. Skin Palpation: Skin temperature, texture, and thickness normal to palpation  Neuro: Cranial nerves: II - XII grossly intact  Motor: Normal strength throughout DTRs: 2+ and symmetric in UE without delay in relaxation phase  Mental Status: Judgment, insight: Intact Orientation: Oriented to time, place, and person Mood and affect: No depression, anxiety, or agitation     DATA REVIEWED: Results for BELLAMARIE, PFLUG (MRN [id number]) as of 02/21/2018 13:11  Ref. Range 04/05/2016 08:32 04/30/2017 10:51 10/18/2017 09:47 12/24/2017 09:51 12/24/2017 19:03 01/08/2018 12:11 01/08/2018 12:20  TSH Latest Ref Range: 0.35 - 4.50 uIU/mL 0.73 0.19 (L) 0.50 0.214 (L)   0.14 (L)  Triiodothyronine,Free,Serum Latest Ref Range: 2.3 - 4.2 pg/mL   3.5      Triiodothyronine (T3) Latest Ref Range: 71 - 180 ng/dL     90    T4,Free(Direct) Latest Ref Range: 0.60 - 1.60 ng/dL  0.66 0.84  1.00 1.00    Results for KAMERYN, DAVERN (MRN [id number]) as of 02/24/2018 12:19  Ref. Range 02/21/2018 13:48  TSH Latest Ref  Range: 0.35 - 4.50 uIU/mL 0.11 (L)  Triiodothyronine (T3) Latest Ref Range: 76 - 181 ng/dL 120  T4,Free(Direct) Latest Ref Range: 0.60 - 1.60 ng/dL 0.72  Results for KITARA, HEBB (MRN [id number]) as of 02/24/2018 12:19  Ref. Range 02/21/2018 13:48  Thyrotropin Receptor Ab Latest Ref Range: <=16.0 % <6.0   ASSESSMENT/PLAN/RECOMMENDATIONS:   1. Low TSH / Subclinical Hyperthyroidism:  - Pt is clinically euthyroid  - We discussed D/D of subclinical graves' disease vs autonomous thyroid nodule (s) , I do not think a lab error is an issue here as she denies being on biotin and this has been an ongoing issue for many years. I also do not think she has thyroiditis due to chronicity of the condition.  - We discussed the consequences of excess thyroid hormone, in increasing bone resorption , low bone density and increase in fracture risk. We also discussed the risk of atrial  fibrillation and heart failure. We also discussed that the above risks are her with a TSH < 0.1 uIU/mL    F/U in 6 weeks  Addendum: Spoke to daughter Mateo Flow on 12/23  ~ 8:30 am and discussed negative TRAb values and the need to proceed for an uptake and scan. I explained to her this is a 2 day process , where she has to present herself to the NM department on 2 consecutive days.  Mateo Flow expressed understanding.      Signed electronically by: Mack Guise, MD  [facility] Endocrinology  [facility] [address] [place],  [address] Phone: [phone] FAX: [phone]   CC: Burnis Medin, [place] [address] Phone: [phone] Fax: [phone]   Return to Endocrinology clinic as below: Future Appointments  Date Time Provider Laverne  02/27/2018  1:30 PM [facility] PAT 3 [facility] None  04/21/2018  9:15 AM Panosh, Standley Brooking, MD [facility]  05/28/2018  2:40 PM Nahser, Wonda Cheng, MD [facility]  05/30/2018  1:20 PM  Lorea Kupfer, Melanie Crazier, MD [facility] None

## 2018-02-23 LAB — T3: T3, Total: 120 ng/dL (ref 76–181)

## 2018-02-23 LAB — THYROTROPIN RECEPTOR AUTOABS: Thyrotropin Receptor Ab: <6 % (ref <=16.0)

## 2018-02-24 DIAGNOSIS — J3089 Other allergic rhinitis: Secondary | ICD-10-CM | POA: Diagnosis not present

## 2018-02-24 DIAGNOSIS — J301 Allergic rhinitis due to pollen: Secondary | ICD-10-CM | POA: Diagnosis not present

## 2018-02-24 NOTE — Patient Instructions (Addendum)
[name]  02/24/2018   Your procedure is scheduled on: 03-07-18   Report to [facility] Main  Entrance     Report to admitting at 6:45AM    Call this number if you have problems the morning of surgery [phone]   '  Remember: Do not eat food or drink liquids :After Midnight. BRUSH YOUR TEETH MORNING OF SURGERY AND RINSE YOUR MOUTH OUT, NO CHEWING GUM CANDY OR MINTS.     Take these medicines the morning of surgery with A SIP OF WATER: AMLODIPINE, ALLEGRA IF NEEDED, SYMBICORT INHALER IF NEEDED                                 You may not have any metal on your body including hair pins and              piercings  Do not wear jewelry, make-up, lotions, powders or perfumes, deodorant             Do not wear nail polish.  Do not shave  48 hours prior to surgery.               Do not bring valuables to the hospital. [place].  Contacts, dentures or bridgework may not be worn into surgery.  Leave suitcase in the car. After surgery it may be brought to your room.                   Please read over the following fact sheets you were given: _____________________________________________________________________             [facility] - Preparing for Surgery Before surgery, you can play an important role.  Because skin is not sterile, your skin needs to be as free of germs as possible.  You can reduce the number of germs on your skin by washing with CHG (chlorahexidine gluconate) soap before surgery.  CHG is an antiseptic cleaner which kills germs and bonds with the skin to continue killing germs even after washing. Please DO NOT use if you have an allergy to CHG or antibacterial soaps.  If your skin becomes reddened/irritated stop using the CHG and inform your nurse when you arrive at Short Stay. Do not shave (including legs and underarms) for at least 48 hours prior to the first CHG shower.  You may  shave your face/neck. Please follow these instructions carefully:  1.  Shower with CHG Soap the night before surgery and the  morning of Surgery.  2.  If you choose to wash your hair, wash your hair first as usual with your  normal  shampoo.  3.  After you shampoo, rinse your hair and body thoroughly to remove the  shampoo.                           4.  Use CHG as you would any other liquid soap.  You can apply chg directly  to the skin and wash                       Gently with a scrungie or clean washcloth.  5.  Apply the CHG Soap to  your body ONLY FROM THE NECK DOWN.   Do not use on face/ open                           Wound or open sores. Avoid contact with eyes, ears mouth and genitals (private parts).                       Wash face,  Genitals (private parts) with your normal soap.             6.  Wash thoroughly, paying special attention to the area where your surgery  will be performed.  7.  Thoroughly rinse your body with warm water from the neck down.  8.  DO NOT shower/wash with your normal soap after using and rinsing off  the CHG Soap.                9.  Pat yourself dry with a clean towel.            10.  Wear clean pajamas.            11.  Place clean sheets on your bed the night of your first shower and do not  sleep with pets. Day of Surgery : Do not apply any lotions/deodorants the morning of surgery.  Please wear clean clothes to the hospital/surgery center.  FAILURE TO FOLLOW THESE INSTRUCTIONS MAY RESULT IN THE CANCELLATION OF YOUR SURGERY PATIENT SIGNATURE_________________________________  NURSE SIGNATURE__________________________________  ________________________________________________________________________   [name]  An incentive spirometer is a tool that can help keep your lungs clear and active. This tool measures how well you are filling your lungs with each breath. Taking long deep breaths may help reverse or decrease the chance of developing  breathing (pulmonary) problems (especially infection) following:  A long period of time when you are unable to move or be active. BEFORE THE PROCEDURE   If the spirometer includes an indicator to show your best effort, your nurse or respiratory therapist will set it to a desired goal.  If possible, sit up straight or lean slightly forward. Try not to slouch.  Hold the incentive spirometer in an upright position. INSTRUCTIONS FOR USE  1. Sit on the edge of your bed if possible, or sit up as far as you can in bed or on a chair. 2. Hold the incentive spirometer in an upright position. 3. Breathe out normally. 4. Place the mouthpiece in your mouth and seal your lips tightly around it. 5. Breathe in slowly and as deeply as possible, raising the piston or the ball toward the top of the column. 6. Hold your breath for 3-5 seconds or for as long as possible. Allow the piston or ball to fall to the bottom of the column. 7. Remove the mouthpiece from your mouth and breathe out normally. 8. Rest for a few seconds and repeat Steps 1 through 7 at least 10 times every 1-2 hours when you are awake. Take your time and take a few normal breaths between deep breaths. 9. The spirometer may include an indicator to show your best effort. Use the indicator as a goal to work toward during each repetition. 10. After each set of 10 deep breaths, practice coughing to be sure your lungs are clear. If you have an incision (the cut made at the time of surgery), support your incision when coughing by placing a pillow or rolled up towels firmly against  it. Once you are able to get out of bed, walk around indoors and cough well. You may stop using the incentive spirometer when instructed by your caregiver.  RISKS AND COMPLICATIONS  Take your time so you do not get dizzy or light-headed.  If you are in pain, you may need to take or ask for pain medication before doing incentive spirometry. It is harder to take a deep  breath if you are having pain. AFTER USE  Rest and breathe slowly and easily.  It can be helpful to keep track of a log of your progress. Your caregiver can provide you with a simple table to help with this. If you are using the spirometer at home, follow these instructions: [place] IF:   You are having difficultly using the spirometer.  You have trouble using the spirometer as often as instructed.  Your pain medication is not giving enough relief while using the spirometer.  You develop fever of 100.5 F (38.1 C) or higher. SEEK IMMEDIATE MEDICAL CARE IF:   You cough up bloody sputum that had not been present before.  You develop fever of 102 F (38.9 C) or greater.  You develop worsening pain at or near the incision site. MAKE SURE YOU:   Understand these instructions.  Will watch your condition.  Will get help right away if you are not doing well or get worse. Document Released: 07/02/2006 Document Revised: 05/14/2011 Document Reviewed: 09/02/2006 [facility] Patient Information 2014 [place], [place].   ________________________________________________________________________

## 2018-02-24 NOTE — Progress Notes (Signed)
LOV CARDS/ CARDS CLEARANCE DR. NAHSER 02-21-18 Epic   ECHO 02-23-18 Epic   EKG 02-22-18 Epic

## 2018-02-27 ENCOUNTER — Ambulatory Visit (HOSPITAL_COMMUNITY)
Admission: RE | Admit: 2018-02-27 | Discharge: 2018-02-27 | Disposition: A | Discharge status: Home / Self Care | Payer: Medicare Other | Source: Ambulatory Visit | Attending: Orthopedic Surgery | Admitting: Orthopedic Surgery

## 2018-02-27 ENCOUNTER — Other Ambulatory Visit: Payer: Self-pay

## 2018-02-27 ENCOUNTER — Telehealth: Payer: Self-pay | Admitting: Internal Medicine

## 2018-02-27 ENCOUNTER — Ambulatory Visit (HOSPITAL_COMMUNITY): Payer: Medicare Other

## 2018-02-27 ENCOUNTER — Encounter (HOSPITAL_COMMUNITY)
Admission: RE | Admit: 2018-02-27 | Discharge: 2018-02-27 | Disposition: A | Discharge status: Home / Self Care | Payer: Medicare Other | Source: Ambulatory Visit | Attending: Orthopedic Surgery | Admitting: Orthopedic Surgery

## 2018-02-27 ENCOUNTER — Encounter (HOSPITAL_COMMUNITY): Payer: Self-pay

## 2018-02-27 ENCOUNTER — Encounter (HOSPITAL_COMMUNITY): Payer: Self-pay | Admitting: Physician Assistant

## 2018-02-27 DIAGNOSIS — Z01818 Encounter for other preprocedural examination: Secondary | ICD-10-CM | POA: Insufficient documentation

## 2018-02-27 DIAGNOSIS — K449 Diaphragmatic hernia without obstruction or gangrene: Secondary | ICD-10-CM | POA: Diagnosis not present

## 2018-02-27 DIAGNOSIS — D649 Anemia, unspecified: Secondary | ICD-10-CM

## 2018-02-27 DIAGNOSIS — R8290 Unspecified abnormal findings in urine: Secondary | ICD-10-CM

## 2018-02-27 HISTORY — DX: Localized edema: R60.0

## 2018-02-27 HISTORY — DX: Sleep apnea, unspecified: G47.30

## 2018-02-27 LAB — URINALYSIS, ROUTINE W REFLEX MICROSCOPIC
Bilirubin Urine: NEGATIVE
Glucose, UA: NEGATIVE mg/dL
Hgb urine dipstick: NEGATIVE
Ketones, ur: NEGATIVE mg/dL
Nitrite: POSITIVE — AB
Protein, ur: NEGATIVE mg/dL
Specific Gravity, Urine: 1.016 (ref 1.005–1.030)
WBC, UA: >50 WBC/hpf — ABNORMAL HIGH (ref 0–5)
pH: 6 (ref 5.0–8.0)

## 2018-02-27 LAB — BASIC METABOLIC PANEL
Anion gap: 8 (ref 5–15)
BUN: 20 mg/dL (ref 8–23)
CO2: 28 mmol/L (ref 22–32)
Calcium: 8.8 mg/dL — ABNORMAL LOW (ref 8.9–10.3)
Chloride: 103 mmol/L (ref 98–111)
Creatinine, Ser: 0.71 mg/dL (ref 0.44–1.00)
GFR calc Af Amer: >60 mL/min (ref >60)
GFR calc non Af Amer: >60 mL/min (ref >60)
Glucose, Bld: 102 mg/dL — ABNORMAL HIGH (ref 70–99)
Potassium: 3.8 mmol/L (ref 3.5–5.1)
Sodium: 139 mmol/L (ref 135–145)

## 2018-02-27 LAB — ABO/RH: ABO/RH(D): O POS

## 2018-02-27 LAB — SURGICAL PCR SCREEN
MRSA, PCR: NEGATIVE
Staphylococcus aureus: NEGATIVE

## 2018-02-27 LAB — CBC WITH DIFFERENTIAL/PLATELET
Abs Immature Granulocytes: 0.02 10*3/uL (ref 0.00–0.07)
Basophils Absolute: 0 10*3/uL (ref 0.0–0.1)
Basophils Relative: 0 %
Eosinophils Absolute: 0.2 10*3/uL (ref 0.0–0.5)
Eosinophils Relative: 3 %
HCT: 24.9 % — ABNORMAL LOW (ref 36.0–46.0)
Hemoglobin: 7.3 g/dL — ABNORMAL LOW (ref 12.0–15.0)
Immature Granulocytes: 0 %
Lymphocytes Relative: 17 %
Lymphs Abs: 1.3 10*3/uL (ref 0.7–4.0)
MCH: 23.2 pg — ABNORMAL LOW (ref 26.0–34.0)
MCHC: 29.3 g/dL — ABNORMAL LOW (ref 30.0–36.0)
MCV: 79 fL — ABNORMAL LOW (ref 80.0–100.0)
Monocytes Absolute: 0.7 10*3/uL (ref 0.1–1.0)
Monocytes Relative: 9 %
Neutro Abs: 5.3 10*3/uL (ref 1.7–7.7)
Neutrophils Relative %: 71 %
Platelets: 458 10*3/uL — ABNORMAL HIGH (ref 150–400)
RBC: 3.15 MIL/uL — ABNORMAL LOW (ref 3.87–5.11)
RDW: 16.3 % — ABNORMAL HIGH (ref 11.5–15.5)
WBC: 7.5 10*3/uL (ref 4.0–10.5)
nRBC: 0 % (ref 0.0–0.2)

## 2018-02-27 LAB — APTT: aPTT: 29 seconds (ref 24–36)

## 2018-02-27 LAB — PROTIME-INR
INR: 1.03
Prothrombin Time: 13.4 seconds (ref 11.4–15.2)

## 2018-02-27 NOTE — Telephone Encounter (Signed)
On-call provider, Dr. Nicki Reaper returned call to office. Information relayed to Dr. Nicki Reaper per previous note on 12/26 by [facility]. Dr. Nicki Reaper provided with the number for [place], [place] with [facility] pre admissions, [phone] and was also given home and cell phone number for the pt.

## 2018-02-27 NOTE — Progress Notes (Signed)
Hemoglobin of 7.3 result on pre-op labs done today . Patient chart given to APPs Janett Billow and Ebony Hail to f/u.

## 2018-02-27 NOTE — Telephone Encounter (Signed)
Audria Nine with pre-admissions at [facility] calling to report pt's HGB today 7.3.  States on 01/08/18 HGB was 11.6.  Practice closed at time of call. PA states she would like this addressed today; made aware office closed ; asked to speak to on call provider (Dr. Nicki Reaper.)  Dr. Nicki Reaper unavailable. Pt has total hip scheduled 03/07/2018. PA states surgeon made aware and she attempted to reach patient. States she did not see pt herself but pt was asymptomatic per report. Informed Janett Billow I would route encounter to practice as high priority.  PA's # [phone].

## 2018-02-27 NOTE — Telephone Encounter (Signed)
Received call regarding pre op labs.  Called and spoke to [name] about her labs.  In reviewing her chart, she has a history of iron deficient anemia.  She is not sure if she is taking iron supplements now (since moving into [facility]).  Discussed her low hgb.  She denies any bleeding.  No chest pain, dizziness, light headedness or sob.  Denies any acute issues requiring ER evaluation tonight.  Has surgery planned for 03/07/18.  Discussed the need to have her hgb addressed prior to surgery.  Explained will forward information to her PCP.  Tries to call her daugther [name] to discuss.  Unable to reach.  Will continue to try.

## 2018-02-27 NOTE — Progress Notes (Signed)
Cbcdiff and urinalysis routed via epic to Dr Frederik Pear

## 2018-02-27 NOTE — Progress Notes (Signed)
Hemoglobin 7.3 during PST visit today.  Called Dr. Mayer Camel, he and his PA are out of town until Monday.  Called pt's PCP, Dr. Regis Bill, referred to on call provider, have not heard back yet.  I tried calling the pt several times without success. Will reach out to pt and PCP in the AM as well.    Konrad Felix, PA-C

## 2018-02-28 ENCOUNTER — Telehealth: Payer: Self-pay | Admitting: Gastroenterology

## 2018-02-28 NOTE — Telephone Encounter (Signed)
Contacted by dr Nicki Reaper who had on call contact about hg of 7.3 done for pre op  Per dr Mayer Camel for anticipated THA  In January . No bleeding syncope or sx or change in status  as per patient report .     Based on past hx of avm and no other sx and hx of iron deficiency And having been seen by endocrine and cardiology in the past  2 weeks  . Will  Plan repeat  cbcdiff  And Gi referral asap .  Make sure not taking ASA   But do take iron.   Go to ED with any  Dizziness  shortness of breath  Fainting. Chest pain  Can have her make appt we me also but to expedite matters  .Marland Kitchen Urgent referral to GI   Contact daughter [name] .  Marland Kitchen Dr Nicki Reaper was unable to get in touch with her last night .     Below is past  Colon  Report    COLON FINDINGS: A single AVM was identified in the cecum measuring about 3 mm.  This was obliterated utilizing the argon plasma coagulator.Diverticulosis was noted.   Moderate diverticulosis was noted in the descending colon.   There was moderate scattered diverticulosis noted in the transverse colon and ascending colon. The colon was otherwise normal.  There was no diverticulosis, inflammation, polyps or cancers unless previously stated. Retroflexed views revealed no abnormalities. The time to cecum=  . Withdrawal time=9 minutes 0 seconds.  The scope was withdrawn and the procedure completed. COMPLICATIONS: There were no complications.  ENDOSCOPIC IMPRESSION: 1.  cecal AVM-status post tissue obliteration with APC 2.  diverticulosis  RECOMMENDATIONS: 1.  Continue iron supplementation 2.  CBC 6 weeks; if hemoglobin continues to drop will proceed with repeat enteroscopy to look for recurrent AVMs  eSigned:  Inda Castle, MD 09/14/2013 10:59 AM

## 2018-02-28 NOTE — Telephone Encounter (Signed)
LM with GI to schedile patient ASAP for an appt for eval low hgb 7.3 Advised that she needs to be seen asap - Rep stated that Dr Loletha Carrow is booked out until Jan 16th and there are no openings with a PA. She is sending a note over to Dr Loletha Carrow to make aware appt is needed to see if she can be worked in. Aware also that Dr Loletha Carrow can request to speak with Dr Regis Bill if needed to discuss reason for acute visit. Will await call back.

## 2018-02-28 NOTE — Telephone Encounter (Signed)
appt scheduled for Monday 03/03/18 at 11:30am Pt is going to have STAT labs drawn at [facility] prior to appt at 930am  Nothing further needed.

## 2018-02-28 NOTE — Telephone Encounter (Signed)
Spoke with office and let them know we do not have any appts available until 03/10/18. If pt needs to be seen sooner they may need to send her to the ER.

## 2018-02-28 NOTE — Progress Notes (Addendum)
Anesthesia Chart Review   Case:  [id number] Date/Time:  03/07/18 0900   Procedure:  TOTAL HIP ARTHROPLASTY ANTERIOR APPROACH (Left )   Anesthesia type:  Spinal   Pre-op diagnosis:  LEFT HIP OSTEOARTHRITIS   Location:  [place] [facility] / [facility]   Surgeon:  Frederik Pear, MD      DISCUSSION: 84 yo former smoker (1packyears, quit 03/05/68) with h/o GERD, HTN, chronic diastolic CHF, Anemia, and OSA who is scheduled for above surgery on 03/07/18 with Dr. Mayer Camel.    Last seen by Internal Medicine, Dr. Shanon Ace on 01/24/18.  At this time PCP recommended referral to endocrinology and cardiology screening prior to upcoming surgery due to h/o CHF, anemia, and low TSH level.  Seen by cardiology, Grayland Jack, MD, on 02/21/18 and per his note, "She has normal LV function.  In my opinion, she is at low risk for her upcoming hip surgery."  Seen by endocrinology, Shamleffer, Gershon Crane, MD on 02/23/18.  Pt is scheduled for a 6 week follow up and an uptake and scan will be scheduled.  Thyroiditis ruled out, subclinical graves' vs. autonomous thyroid nodule in differential.   During PST visit hemoglobin 7.3, urinalysis with positive nitrites and leukocytes.  Results called to surgeon, Dr. Mayer Camel.  Results also called to PCP, Dr. Shanon Ace.  Office closed at time of call, message given to on call provider, Dr. Einar Pheasant who spoke with the patient.  She denied bleeding, chest pain, dizziness, light headedness, or shortness of breath, syncope.  Pt advised to go to the ED is status change.  Dr. Regis Bill made an urgent referral 02/28/18 to GI and will repeat cbcdiff.  Advised pt to discontinue ASA and continue iron.    Surgery pending due to hemoglobin of 7.3 and need for further workup.   Addendum 02/28/18 10:50AM:  Pt scheduled with PCP Dr. Regis Bill on 03/03/18 for evaluation.   Pt scheduled for thyroid uptake/scan on 1/30-1/31/19.   VS: BP (!) 107/50   Pulse 78   Temp 36.8 C (Oral)   Resp 16   Ht 5'  (1.524 m)   Wt 63.5 kg   SpO2 100%   BMI 27.34 kg/m   PROVIDERS: Panosh, Standley Brooking, MD   LABS: Labs reviewed: Repeat called in to Surgeon and PCP who is evaluating (all labs ordered are listed, but only abnormal results are displayed)  Labs Reviewed  URINALYSIS, ROUTINE W REFLEX MICROSCOPIC - Abnormal; Notable for the following components:      Result Value   APPearance HAZY (*)    Nitrite POSITIVE (*)    Leukocytes, UA LARGE (*)    WBC, UA >50 (*)    Bacteria, UA RARE (*)    All other components within normal limits  BASIC METABOLIC PANEL - Abnormal; Notable for the following components:   Glucose, Bld 102 (*)    Calcium 8.8 (*)    All other components within normal limits  CBC WITH DIFFERENTIAL/PLATELET - Abnormal; Notable for the following components:   RBC 3.15 (*)    Hemoglobin 7.3 (*)    HCT 24.9 (*)    MCV 79.0 (*)    MCH 23.2 (*)    MCHC 29.3 (*)    RDW 16.3 (*)    Platelets 458 (*)    All other components within normal limits  SURGICAL PCR SCREEN  APTT  PROTIME-INR  TYPE AND SCREEN  ABO/RH     IMAGES: Chest xray 02/27/18  FINDINGS: Moderate hiatal hernia.  The heart, hila, and mediastinum are unremarkable. No pneumothorax. No nodules or masses. Posterior eventration of the left hemidiaphragm, unchanged on the lateral view. No new nodule, mass, or suspicious infiltrate.  IMPRESSION: Moderate hiatal hernia.  No acute abnormality.  EKG: 12/23/17 Rate 86 Sinus rhythm Nonspecific T wave abnormality No significant change since last tracing  CV: Echo 12/24/17  Study Conclusions  - Left ventricle: The cavity size was normal. Systolic function was   normal. The estimated ejection fraction was in the range of 60%   to 65%. Wall motion was normal; there were no regional wall   motion abnormalities. Doppler parameters are consistent with   abnormal left ventricular relaxation (grade 1 diastolic   dysfunction). - Aortic valve: Valve area (VTI):  2.22 cm^2. Valve area (Vmax):   2.15 cm^2. Valve area (Vmean): 1.88 cm^2.   Past Medical History:  Diagnosis Date  . Allergy   . Anemia 2011   Since last visit she has had a Gi evaluation for newer onset Iron deficiiency anemia that was felt secondary to avm in duodenum rx with laser ablation.  . Arthritis    in back  . Asthma   . Cancer ([place])    rt breast  . GERD (gastroesophageal reflux disease)   . Hyperlipidemia   . Hypertension   . Lower extremity edema    ankles, feet   . Sleep apnea     Past Surgical History:  Procedure Laterality Date  . BREAST LUMPECTOMY  04/26/2010   right  . COLONOSCOPY WITH PROPOFOL N/A 09/14/2013   Procedure: COLONOSCOPY WITH PROPOFOL;  Surgeon: Inda Castle, MD;  Location: [facility];  Service: Endoscopy;  Laterality: N/A;  . HOT HEMOSTASIS N/A 09/14/2013   Procedure: HOT HEMOSTASIS (ARGON PLASMA COAGULATION/BICAP);  Surgeon: Inda Castle, MD;  Location: [facility];  Service: Endoscopy;  Laterality: N/A;  . LARYNGECTOMY  20-25 yrs ago  . PARTIAL HIP ARTHROPLASTY     rt    MEDICATIONS: . amLODipine (NORVASC) 5 MG tablet  . budesonide-formoterol (SYMBICORT) 160-4.5 MCG/ACT inhaler  . fexofenadine (ALLEGRA) 180 MG tablet  . lisinopril-hydrochlorothiazide (PRINZIDE,ZESTORETIC) 20-12.5 MG tablet   No current facility-administered medications for this encounter.      Konrad Felix, PA-C [facility] Pre-Surgical Testing 02/28/18 9:55 AM  [phone]

## 2018-02-28 NOTE — Addendum Note (Signed)
Addended by: Virl Cagey on: 02/28/2018 12:45 PM   Modules accepted: Orders

## 2018-02-28 NOTE — Telephone Encounter (Signed)
Ashton from [facility] called to sched an appt for pt but we have no availability.  Pt's hemoglobin has dropped drastically and pt is sched for surgery Jan 3.  Pt needs to be seen ASAP.

## 2018-02-28 NOTE — Telephone Encounter (Signed)
Per Dr Regis Bill, she would like to recheck a urine with micro as well with culture. Last Urine was abnormal.  Nothing further needed.

## 2018-02-28 NOTE — Telephone Encounter (Signed)
See other TE.

## 2018-02-28 NOTE — Telephone Encounter (Signed)
[facility] called back, no appts available  first avail is the Jan 6th with PA    GI call back at [phone]   Per Dr Regis Bill, schedule with Dr Regis Bill on Monday 03/03/18 Take Iron twice daily, no ASA, no NSAIDS Recheck CBCD on 03/03/18 STAT  If over the weekend starts having nay sx's of feeling weak, lightheaded needs to go to ED to be evaluated.

## 2018-03-03 ENCOUNTER — Other Ambulatory Visit (INDEPENDENT_AMBULATORY_CARE_PROVIDER_SITE_OTHER): Payer: Medicare Other

## 2018-03-03 ENCOUNTER — Ambulatory Visit (INDEPENDENT_AMBULATORY_CARE_PROVIDER_SITE_OTHER): Payer: Medicare Other | Admitting: Internal Medicine

## 2018-03-03 ENCOUNTER — Encounter: Payer: Self-pay | Admitting: Internal Medicine

## 2018-03-03 VITALS — BP 132/64 | Temp 97.6°F | Ht 60.0 in | Wt 139.9 lb

## 2018-03-03 DIAGNOSIS — D509 Iron deficiency anemia, unspecified: Secondary | ICD-10-CM

## 2018-03-03 DIAGNOSIS — Z79899 Other long term (current) drug therapy: Secondary | ICD-10-CM | POA: Diagnosis not present

## 2018-03-03 DIAGNOSIS — R8290 Unspecified abnormal findings in urine: Secondary | ICD-10-CM | POA: Diagnosis not present

## 2018-03-03 DIAGNOSIS — D649 Anemia, unspecified: Secondary | ICD-10-CM | POA: Diagnosis not present

## 2018-03-03 LAB — URINALYSIS, ROUTINE W REFLEX MICROSCOPIC
Bilirubin Urine: NEGATIVE
Ketones, ur: NEGATIVE
Nitrite: POSITIVE — AB
Specific Gravity, Urine: 1.01 (ref 1.000–1.030)
Total Protein, Urine: NEGATIVE
Urine Glucose: NEGATIVE
Urobilinogen, UA: 0.2 (ref 0.0–1.0)
pH: 7 (ref 5.0–8.0)

## 2018-03-03 LAB — CBC WITH DIFFERENTIAL/PLATELET
Basophils Absolute: 0 10*3/uL (ref 0.0–0.1)
Basophils Relative: 0.5 % (ref 0.0–3.0)
Eosinophils Absolute: 0.2 10*3/uL (ref 0.0–0.7)
Eosinophils Relative: 2.5 % (ref 0.0–5.0)
HCT: 24.8 % — ABNORMAL LOW (ref 36.0–46.0)
Hemoglobin: 7.7 g/dL — CL (ref 12.0–15.0)
Lymphocytes Relative: 14.1 % (ref 12.0–46.0)
Lymphs Abs: 1.1 10*3/uL (ref 0.7–4.0)
MCHC: 31.4 g/dL (ref 30.0–36.0)
MCV: 72.9 fl — ABNORMAL LOW (ref 78.0–100.0)
Monocytes Absolute: 0.7 10*3/uL (ref 0.1–1.0)
Monocytes Relative: 9.3 % (ref 3.0–12.0)
Neutro Abs: 5.8 10*3/uL (ref 1.4–7.7)
Neutrophils Relative %: 73.6 % (ref 43.0–77.0)
Platelets: 487 10*3/uL — ABNORMAL HIGH (ref 150.0–400.0)
RBC: 3.41 Mil/uL — ABNORMAL LOW (ref 3.87–5.11)
RDW: 17.8 % — ABNORMAL HIGH (ref 11.5–15.5)
WBC: 7.9 10*3/uL (ref 4.0–10.5)

## 2018-03-03 NOTE — Patient Instructions (Addendum)
Glad you are feeling well today  But your hemoglobin has dropped  A lot since November and suspecting it is a leak from your GI tracts.  Will continue working on getting you to see the GI.    Department   In the interim take iron twice a day . For now.     Could take every other day   If bothering stomach .   Plan on getting a culture tests on the urine  As you may have a UTI also.   If getting any fainting  Shortness of breath or   Light headed concerns in the interim then seek  Care in ED.    Plan follow up depending on  Evaluation.   Get cbc diff in 2 weeks or thereabout  At the [facility] lab  As you did today  ( unless already done by other means. )

## 2018-03-03 NOTE — Telephone Encounter (Signed)
Saw patient today and seems stable but HG still in 7 range  Placed on iron.  I placed a referral urgent  . Since appt doesn't appear to have been made  Yet  Thanks [facility]

## 2018-03-03 NOTE — Telephone Encounter (Signed)
See message from med to Dr. Regis Bill.  Please arrange appt with me or APP after this patient's upcoming surgery.

## 2018-03-03 NOTE — Telephone Encounter (Signed)
Patient has been scheduled to see Dr. Loletha Carrow 03/20/18 at 1:30pm. Appt letter mailed to pt.

## 2018-03-03 NOTE — Progress Notes (Signed)
Chief Complaint  Patient presents with  . Follow-up    lab results     HPI: [name] 84 y.o. come in for increasing  anemia  Found on preop labs for THA.  As well as abnormal UA  Here with daughter Has hx of   Iron def anemia    Remote hx of avm rx GI 2015 and since then had sa had  Reasonable  stable hg.  See phone notes  Unable to get appt with gi for 2 weeks  She has also since seen endo regarding her low tsh  ( see past notes)  Feel stable no syncope  But card has decreased her  Amlodipine to 5 mg  No cp sob bleeding  Falling   No hematuria fever  NO uti sx but has nocturia   No pain and no recent change in urinary patterns  ROS: See pertinent positives and negatives per HPI.  Past Medical History:  Diagnosis Date  . Allergy   . Anemia 2011   Since last visit she has had a Gi evaluation for newer onset Iron deficiiency anemia that was felt secondary to avm in duodenum rx with laser ablation.  . Arthritis    in back  . Asthma   . Cancer ([place])    rt breast  . GERD (gastroesophageal reflux disease)   . Hyperlipidemia   . Hypertension   . Lower extremity edema    ankles, feet   . Sleep apnea     Family History  Problem Relation Age of Onset  . Cancer Mother        breast and ovarian    Social History   Socioeconomic History  . Marital status: Widowed    Spouse name: Not on file  . Number of children: Not on file  . Years of education: Not on file  . Highest education level: Not on file  Occupational History  . Not on file  Social Needs  . Financial resource strain: Not on file  . Food insecurity:    Worry: Not on file    Inability: Not on file  . Transportation needs:    Medical: Not on file    Non-medical: Not on file  Tobacco Use  . Smoking status: Former Smoker    Packs/day: 0.25    Years: 4.00    Pack years: 1.00    Types: Cigarettes    Last attempt to quit: 03/05/1968    Years since quitting: 50.0  . Smokeless tobacco: Never Used    Substance and Sexual Activity  . Alcohol use: Not Currently    Comment: on occ   . Drug use: No  . Sexual activity: Not on file  Lifestyle  . Physical activity:    Days per week: Not on file    Minutes per session: Not on file  . Stress: Not on file  Relationships  . Social connections:    Talks on phone: Not on file    Gets together: Not on file    Attends religious service: Not on file    Active member of club or organization: Not on file    Attends meetings of clubs or organizations: Not on file    Relationship status: Not on file  Other Topics Concern  . Not on file  Social History Narrative   Widowed 2001    Ex smoker   HHof 1    Active in church and volunteer activities  Bowls a few times per week   Swimming 2 x per week.    Tobacco 49=58 less than 1ppd.              Outpatient Medications Prior to Visit  Medication Sig Dispense Refill  . amLODipine (NORVASC) 5 MG tablet Take 1 tablet (5 mg total) by mouth daily. 90 tablet 3  . budesonide-formoterol (SYMBICORT) 160-4.5 MCG/ACT inhaler Inhale 2 puffs into the lungs 2 (two) times daily as needed (Shortness of breath).     . fexofenadine (ALLEGRA) 180 MG tablet Take 180 mg by mouth daily as needed for allergies or rhinitis.    Marland Kitchen lisinopril-hydrochlorothiazide (PRINZIDE,ZESTORETIC) 20-12.5 MG tablet Take 1 tablet by mouth 2 (two) times daily. 180 tablet 1   No facility-administered medications prior to visit.      EXAM:  BP 132/64 (BP Location: Right Arm, Patient Position: Sitting, Cuff Size: Normal)   Temp 97.6 F (36.4 C) (Oral)   Ht 5' (1.524 m)   Wt 139 lb 14.4 oz (63.5 kg)   BMI 27.32 kg/m   Body mass index is 27.32 kg/m.  GENERAL: vitals reviewed and listed above, alert, oriented, appears well hydrated and in no acute distress here with daughter  Looks well  HEENT: atraumatic, conjunctiva  clear, no obvious abnormalities on inspection of external nose and ears NECK: no obvious masses on inspection  palpation  LUNGS: clear to auscultation bilaterally, no wheezes, rales or rhonchi, good air movement CV: HRRR, no clubbing cyanosis or  peripheral edema nl cap refill  abd no masses noted  MS: moves all extremities  Pain with gait using  Cane independent  PSYCH: pleasant and cooperative, no obvious depression or anxiety Lab Results  Component Value Date   WBC 7.9 03/03/2018   HGB 7.7 Repeated and verified X2. (LL) 03/03/2018   HCT 24.8 (L) 03/03/2018   PLT 487.0 (H) 03/03/2018   GLUCOSE 102 (H) 02/27/2018   CHOL 260 (H) 04/30/2017   TRIG 127.0 04/30/2017   HDL 72.00 04/30/2017   LDLCALC 163 (H) 04/30/2017   ALT 26 12/24/2017   AST 55 (H) 12/24/2017   NA 139 02/27/2018   K 3.8 02/27/2018   CL 103 02/27/2018   CREATININE 0.71 02/27/2018   BUN 20 02/27/2018   CO2 28 02/27/2018   TSH 0.11 (L) 02/21/2018   INR 1.03 02/27/2018   HGBA1C 5.6 04/30/2017   BP Readings from Last 3 Encounters:  03/03/18 132/64  02/27/18 (!) 107/50  02/21/18 124/64    ASSESSMENT AND PLAN:  Discussed the following assessment and plan:  Low hemoglobin - Plan: CBC with Differential/Platelet, Ambulatory referral to Gastroenterology  Iron deficiency anemia, unspecified iron deficiency anemia type - Plan: CBC with Differential/Platelet, Ambulatory referral to Gastroenterology  Abnormal urine finding  Medication management Abn UA  consistent with bacteriuria and uti but no new sx   Will await cx and then rx  If no sx.  States  not aware of any allergy to antibiotic.  Assume the  iron def is from  Gi tract .   Surgery has been postponed until stable and evaluated .  -Patient advised to return or notify health care team  if  new concerns arise.  Patient Instructions  Glad you are feeling well today  But your hemoglobin has dropped  A lot since November and suspecting it is a leak from your GI tracts.  Will continue working on getting you to see the GI.    Department   In  the interim take iron twice  a day . For now.     Could take every other day   If bothering stomach .   Plan on getting a culture tests on the urine  As you may have a UTI also.   If getting any fainting  Shortness of breath or   Light headed concerns in the interim then seek  Care in ED.    Plan follow up depending on  Evaluation.   Get cbc diff in 2 weeks or thereabout  At the [facility] lab  As you did today  ( unless already done by other means. )     Mariann Laster K. Panosh M.D.

## 2018-03-03 NOTE — Telephone Encounter (Signed)
Dr. Regis Bill,    This patient has a history of intestinal AVMs dating back to at least 2015 , which were causing anemia.   Something similar may be going on again. However, the degree of anemia currently precludes any immediate endoscopic workup. Given our limited office availability over holiday, my strong advice is that you make arrangements immediately for this patient to be transfused PRBCs and administered IV iron.

## 2018-03-03 NOTE — Telephone Encounter (Signed)
Thanks for your advice   She will still need a  Gi  follow up  eventually. So would appreciate some type of future appt for her  Thanks.

## 2018-03-04 ENCOUNTER — Other Ambulatory Visit: Payer: Self-pay | Admitting: Internal Medicine

## 2018-03-04 DIAGNOSIS — D509 Iron deficiency anemia, unspecified: Secondary | ICD-10-CM

## 2018-03-04 DIAGNOSIS — D649 Anemia, unspecified: Secondary | ICD-10-CM

## 2018-03-04 NOTE — Progress Notes (Signed)
Spoke to pt daughter. Pt wants to know what oncology referral is for  Please advise, Kerrin Champagne., RMA

## 2018-03-04 NOTE — Progress Notes (Signed)
See notes    From dr  Loletha Carrow refer for possible iron infusion  .  To heamtology   She isclinically stable   Please advise patient  And daughter that I am doing referral  To hematology for possible iron infusion consideration to  Hopefully getting the hemoglobin up more quickly .   So  they would be calling her about this   Gi making appt for you in the future

## 2018-03-07 ENCOUNTER — Encounter (HOSPITAL_COMMUNITY): Admission: RE | Payer: Self-pay | Source: Home / Self Care

## 2018-03-07 ENCOUNTER — Inpatient Hospital Stay (HOSPITAL_COMMUNITY): Admission: RE | Admit: 2018-03-07 | Payer: Medicare Other | Source: Home / Self Care | Admitting: Orthopedic Surgery

## 2018-03-07 LAB — TYPE AND SCREEN
ABO/RH(D): O POS
Antibody Screen: NEGATIVE

## 2018-03-07 SURGERY — ARTHROPLASTY, HIP, TOTAL, ANTERIOR APPROACH
Anesthesia: Spinal | Laterality: Left

## 2018-03-12 ENCOUNTER — Telehealth: Payer: Self-pay | Admitting: Internal Medicine

## 2018-03-12 NOTE — Telephone Encounter (Signed)
Number for hematology was given to pt

## 2018-03-12 NOTE — Telephone Encounter (Signed)
Called pt's daughter Mateo Flow and told her that hematology was trying to reach out and schedule an appt. Pt Daughter said she would give them a call and schedule an appt. Advised that it would show up as oncology department but that hematology is apart of that building

## 2018-03-12 NOTE — Telephone Encounter (Signed)
Patients daughter Mateo Flow called back stated that she had no messages or missed call from anyone. She said anyone trying to reach her can do so on either number on file. Please advise

## 2018-03-20 ENCOUNTER — Ambulatory Visit (INDEPENDENT_AMBULATORY_CARE_PROVIDER_SITE_OTHER): Payer: Medicare Other | Admitting: Gastroenterology

## 2018-03-20 ENCOUNTER — Other Ambulatory Visit (INDEPENDENT_AMBULATORY_CARE_PROVIDER_SITE_OTHER): Payer: Medicare Other

## 2018-03-20 ENCOUNTER — Encounter: Payer: Self-pay | Admitting: Gastroenterology

## 2018-03-20 VITALS — BP 116/60 | HR 80 | Ht 59.0 in | Wt 142.0 lb

## 2018-03-20 DIAGNOSIS — D500 Iron deficiency anemia secondary to blood loss (chronic): Secondary | ICD-10-CM

## 2018-03-20 DIAGNOSIS — K5521 Angiodysplasia of colon with hemorrhage: Secondary | ICD-10-CM

## 2018-03-20 LAB — CBC WITH DIFFERENTIAL/PLATELET
Basophils Absolute: 0.1 10*3/uL (ref 0.0–0.1)
Basophils Relative: 0.6 % (ref 0.0–3.0)
Eosinophils Absolute: 0.2 10*3/uL (ref 0.0–0.7)
Eosinophils Relative: 2.8 % (ref 0.0–5.0)
HCT: 30.4 % — ABNORMAL LOW (ref 36.0–46.0)
Hemoglobin: 9.4 g/dL — ABNORMAL LOW (ref 12.0–15.0)
Lymphocytes Relative: 20 % (ref 12.0–46.0)
Lymphs Abs: 1.7 10*3/uL (ref 0.7–4.0)
MCHC: 30.9 g/dL (ref 30.0–36.0)
MCV: 75 fl — ABNORMAL LOW (ref 78.0–100.0)
Monocytes Absolute: 0.6 10*3/uL (ref 0.1–1.0)
Monocytes Relative: 7.4 % (ref 3.0–12.0)
Neutro Abs: 5.8 10*3/uL (ref 1.4–7.7)
Neutrophils Relative %: 69.2 % (ref 43.0–77.0)
Platelets: 494 10*3/uL — ABNORMAL HIGH (ref 150.0–400.0)
RBC: 4.06 Mil/uL (ref 3.87–5.11)
RDW: 20.4 % — ABNORMAL HIGH (ref 11.5–15.5)
WBC: 8.3 10*3/uL (ref 4.0–10.5)

## 2018-03-20 LAB — IBC PANEL
Iron: 16 ug/dL — ABNORMAL LOW (ref 42–145)
Saturation Ratios: 4 % — ABNORMAL LOW (ref 20.0–50.0)
Transferrin: 286 mg/dL (ref 212.0–360.0)

## 2018-03-20 LAB — FERRITIN: Ferritin: 7.6 ng/mL — ABNORMAL LOW (ref 10.0–291.0)

## 2018-03-20 NOTE — Progress Notes (Signed)
[name] Progress Note  Chief Complaint: Iron deficiency anemia  Subjective  History:  This is an 85 year old woman known to me from prior office visits, was recently March 2019 for diarrhea.  She has a history of iron deficiency anemia dating back to at least 2015, evaluated by Dr. Deatra [name].  At that time, she had ablation of a nonbleeding colonic AVM.  She had ablation of several nonbleeding duodenal AVMs in 2010.  She was recently scheduled for orthopedic surgery but it was canceled when her hemoglobin was found to be very low.  Request was made to [place] for an urgent evaluation, but there was no office availability.  I advised her primary care provider to make arrangements for blood transfusion.  The patient was put on iron supplements.  [name] was here with her daughter [name].  Both report that there has been no bloody or black stool.  [name] has been at an assisted living facility since late October.  In early November her saturation was low with a ferritin of 51.  It was not until the significant decrease in hemoglobin that oral iron twice daily was started.  She has been taking that regularly, eating well, denies abdominal pain, nausea, vomiting or altered bowel habits.  ROS: Cardiovascular:  no chest pain Respiratory: no dyspnea Fatigue Chronic left hip pain osteoarthritis Remainder of systems negative except as above The patient's Past Medical, Family and Social History were reviewed and are on file in the EMR.  Objective:  Med list reviewed  Current Outpatient Medications:  .  amLODipine (NORVASC) 5 MG tablet, Take 1 tablet (5 mg total) by mouth daily., Disp: 90 tablet, Rfl: 3 .  budesonide-formoterol (SYMBICORT) 160-4.5 MCG/ACT inhaler, Inhale 2 puffs into the lungs 2 (two) times daily as needed (Shortness of breath). , Disp: , Rfl:  .  fexofenadine (ALLEGRA) 180 MG tablet, Take 180 mg by mouth daily as needed for allergies or rhinitis., Disp: , Rfl:  .   lisinopril-hydrochlorothiazide (PRINZIDE,ZESTORETIC) 20-12.5 MG tablet, Take 1 tablet by mouth 2 (two) times daily., Disp: 180 tablet, Rfl: 1   Vital signs in last 24 hrs: Vitals:   03/20/18 1315  BP: 116/60  Pulse: 80    Physical Exam  Frail elderly woman, antalgic gait supported by walker, gets on exam table slowly and with assistance  HEENT: sclera anicteric, oral mucosa moist without lesions  Neck: supple, no thyromegaly, JVD or lymphadenopathy  Cardiac: RRR without murmurs, S1S2 heard, mild peripheral edema  Pulm: clear to auscultation bilaterally, normal RR and effort noted  Abdomen: soft, no tenderness, with active bowel sounds. No guarding or palpable hepatosplenomegaly.  Skin; warm and dry, no jaundice or rash  Recent Labs:  CBC Latest Ref Rng & Units 03/03/2018 02/27/2018 01/08/2018  WBC 4.0 - 10.5 K/uL 7.9 7.5 6.6  Hemoglobin 12.0 - 15.0 g/dL 7.7 Repeated and verified X2.(LL) 7.3(L) 11.6(L)  Hematocrit 36.0 - 46.0 % 24.8(L) 24.9(L) 36.2  Platelets 150.0 - 400.0 K/uL 487.0(H) 458(H) 390.0   MCV 73  CMP Latest Ref Rng & Units 02/27/2018 01/08/2018 12/25/2017  Glucose 70 - 99 mg/dL 102(H) 89 82  BUN 8 - 23 mg/dL 20 17 13   Creatinine 0.44 - 1.00 mg/dL 0.71 0.57 0.48  Sodium 135 - 145 mmol/L 139 140 141  Potassium 3.5 - 5.1 mmol/L 3.8 3.9 3.4(L)  Chloride 98 - 111 mmol/L 103 103 107  CO2 22 - 32 mmol/L 28 28 24   Calcium 8.9 - 10.3 mg/dL 8.8(L) 9.7  8.0(L)  Total Protein 6.5 - 8.1 g/dL - - -  Total Bilirubin 0.3 - 1.2 mg/dL - - -  Alkaline Phos 38 - 126 U/L - - -  AST 15 - 41 U/L - - -  ALT 0 - 44 U/L - - -     @ASSESSMENTPLANBEGIN @ Assessment: Encounter Diagnoses  Name Primary?  . Iron deficiency anemia due to chronic blood loss Yes  . Angiodysplasia of intestine with hemorrhage    She most likely has acute worsening of her chronic anemia from occult small bowel blood loss.  It is probably from AVMs as before, which can be notoriously difficult to find  and ablate.  Even if that is achieved, they often recur.  I wonder if she may have had a more acute drop somewhere in the month of November or early December that she just did not recognize.  She does have difficulty with her memory.  We discussed the options at this point.  For certain, we will check CBC and iron studies today.  If the hemoglobin has not gone up to at least 9, I will make arrangements for a dose of IV iron.  If she is still planning to have hip replacement (with possible need for postoperative anticoagulation/DVT prophylaxis and expected operative blood loss), then she would most likely need to have a small bowel enteroscopy to attempt localization and treatment of small bowel bleeding source.  I do not think she is in condition to undergo a colonoscopy at this point.  [name] and her daughter are now uncertain about proceeding with hip replacement.  If she is not to have a hip replacement, the most prudent plan may be to see what blood counts are today, give IV iron if needed, and see if hemoglobin responds appropriately.  If it does, then perhaps this can be just monitored fully on adequate iron supplementation.  We will contact her daughter [name] with results of blood work and further plan.  Total time 30 minutes, over half spent face-to-face with patient in counseling and coordination of care.   [name]

## 2018-03-20 NOTE — Patient Instructions (Signed)
If you are age 83 or older, your body mass index should be between 23-30. Your Body mass index is 28.68 kg/m. If this is out of the aforementioned range listed, please consider follow up with your Primary Care Provider.  If you are age 45 or younger, your body mass index should be between 19-25. Your Body mass index is 28.68 kg/m. If this is out of the aformentioned range listed, please consider follow up with your Primary Care Provider.   Your provider has requested that you go to the basement level for lab work before leaving today. Press "B" on the elevator. The lab is located at the first door on the left as you exit the elevator.  Thank you for choosing me and [facility].

## 2018-03-27 ENCOUNTER — Other Ambulatory Visit: Payer: Self-pay

## 2018-03-27 DIAGNOSIS — D509 Iron deficiency anemia, unspecified: Secondary | ICD-10-CM

## 2018-03-31 ENCOUNTER — Encounter (HOSPITAL_COMMUNITY): Payer: Medicare Other

## 2018-03-31 ENCOUNTER — Telehealth: Payer: Self-pay | Admitting: *Deleted

## 2018-03-31 NOTE — Telephone Encounter (Deleted)
It looks like she  Got an iron  Infusion

## 2018-03-31 NOTE — Telephone Encounter (Signed)
Copied from [place] [id number]. Topic: General - Other >> Mar 31, 2018  4:10 PM Ivar Drape wrote: Reason for CRM:   Patient's daughter, Adaiah Jaskot [phone] would like for the patient to continue to have PT services, [facility].  They need a physicians order to continue physical therapy treatment on her shoulder from the fall in October. Please fax the order to Attn: Cristie Hem [phone]   Ph: [phone]

## 2018-03-31 NOTE — Telephone Encounter (Addendum)
Ok to continue PT.

## 2018-04-03 ENCOUNTER — Encounter (HOSPITAL_COMMUNITY): Payer: Medicare Other

## 2018-04-04 ENCOUNTER — Encounter (HOSPITAL_COMMUNITY): Payer: Medicare Other

## 2018-04-04 NOTE — Telephone Encounter (Signed)
Ok to continue pt

## 2018-04-04 NOTE — Telephone Encounter (Signed)
Form in red folder to sign off on orders please sign   Thank you  Kerrin Champagne., RMA

## 2018-04-04 NOTE — Telephone Encounter (Signed)
done

## 2018-04-04 NOTE — Telephone Encounter (Signed)
Form has been faxed to [phone]

## 2018-04-07 ENCOUNTER — Telehealth: Payer: Self-pay | Admitting: Internal Medicine

## 2018-04-07 NOTE — Telephone Encounter (Signed)
Copied from [place] [id number]. Topic: Referral - Medical Records >> Apr 07, 2018  8:53 AM Robina Ade, Helene Kelp D wrote: Reason for CRM: Alex with [facility] called and they want to know the reason of patient referral to them. They would like any imaging results taken at the office or at the hospital. Please fax over notes to [phone] and his number is [phone].

## 2018-04-07 NOTE — Telephone Encounter (Signed)
Alex with [facility] is calling back, please advise. Please call him at [phone]

## 2018-04-09 DIAGNOSIS — I5032 Chronic diastolic (congestive) heart failure: Secondary | ICD-10-CM | POA: Diagnosis not present

## 2018-04-09 DIAGNOSIS — J45909 Unspecified asthma, uncomplicated: Secondary | ICD-10-CM | POA: Diagnosis not present

## 2018-04-09 DIAGNOSIS — Z9181 History of falling: Secondary | ICD-10-CM | POA: Diagnosis not present

## 2018-04-09 DIAGNOSIS — K219 Gastro-esophageal reflux disease without esophagitis: Secondary | ICD-10-CM | POA: Diagnosis not present

## 2018-04-09 DIAGNOSIS — M1612 Unilateral primary osteoarthritis, left hip: Secondary | ICD-10-CM | POA: Diagnosis not present

## 2018-04-09 DIAGNOSIS — M479 Spondylosis, unspecified: Secondary | ICD-10-CM | POA: Diagnosis not present

## 2018-04-09 DIAGNOSIS — D500 Iron deficiency anemia secondary to blood loss (chronic): Secondary | ICD-10-CM | POA: Diagnosis not present

## 2018-04-09 DIAGNOSIS — Z853 Personal history of malignant neoplasm of breast: Secondary | ICD-10-CM | POA: Diagnosis not present

## 2018-04-09 DIAGNOSIS — I110 Hypertensive heart disease with heart failure: Secondary | ICD-10-CM | POA: Diagnosis not present

## 2018-04-09 DIAGNOSIS — E785 Hyperlipidemia, unspecified: Secondary | ICD-10-CM | POA: Diagnosis not present

## 2018-04-09 DIAGNOSIS — Z87891 Personal history of nicotine dependence: Secondary | ICD-10-CM | POA: Diagnosis not present

## 2018-04-09 DIAGNOSIS — E0580 Other thyrotoxicosis without thyrotoxic crisis or storm: Secondary | ICD-10-CM | POA: Diagnosis not present

## 2018-04-09 NOTE — Telephone Encounter (Signed)
Spoke with home health and they said that the pain in the shoulder can not be shoulder and can be hip arthritis

## 2018-04-09 NOTE — Telephone Encounter (Signed)
Nothing further needed at this time. 

## 2018-04-09 NOTE — Telephone Encounter (Signed)
This was before any surgery  For  Hip arthritis  And hx of moblity   lmintatinos hx of falling    Anemia   Gi bleeding   Asthma

## 2018-04-10 ENCOUNTER — Telehealth: Payer: Self-pay

## 2018-04-10 ENCOUNTER — Telehealth: Payer: Self-pay | Admitting: Internal Medicine

## 2018-04-10 DIAGNOSIS — D500 Iron deficiency anemia secondary to blood loss (chronic): Secondary | ICD-10-CM | POA: Diagnosis not present

## 2018-04-10 DIAGNOSIS — I110 Hypertensive heart disease with heart failure: Secondary | ICD-10-CM | POA: Diagnosis not present

## 2018-04-10 DIAGNOSIS — E785 Hyperlipidemia, unspecified: Secondary | ICD-10-CM | POA: Diagnosis not present

## 2018-04-10 DIAGNOSIS — I5032 Chronic diastolic (congestive) heart failure: Secondary | ICD-10-CM | POA: Diagnosis not present

## 2018-04-10 DIAGNOSIS — E0580 Other thyrotoxicosis without thyrotoxic crisis or storm: Secondary | ICD-10-CM | POA: Diagnosis not present

## 2018-04-10 DIAGNOSIS — M1612 Unilateral primary osteoarthritis, left hip: Secondary | ICD-10-CM | POA: Diagnosis not present

## 2018-04-10 NOTE — Telephone Encounter (Signed)
Form in red folder from home health to fill out

## 2018-04-10 NOTE — Telephone Encounter (Unsigned)
Copied from [place] [id number]. Topic: Quick Communication - Home Health Verbal Orders >> Apr 10, 2018  5:12 PM Yvette Rack wrote: Caller/Agency: Doug with [facility] Number: [phone] Requesting OT/PT/Skilled Nursing/Social Work: PT Frequency: 1 time a week for 3 weeks and 1 time a week for 3 weeks

## 2018-04-11 NOTE — Telephone Encounter (Signed)
Called doug and gave verbal orders per Dr.Panosh

## 2018-04-11 NOTE — Telephone Encounter (Signed)
Form to sign from home health care

## 2018-04-15 DIAGNOSIS — E785 Hyperlipidemia, unspecified: Secondary | ICD-10-CM | POA: Diagnosis not present

## 2018-04-15 DIAGNOSIS — E0580 Other thyrotoxicosis without thyrotoxic crisis or storm: Secondary | ICD-10-CM | POA: Diagnosis not present

## 2018-04-15 DIAGNOSIS — I110 Hypertensive heart disease with heart failure: Secondary | ICD-10-CM | POA: Diagnosis not present

## 2018-04-15 DIAGNOSIS — D500 Iron deficiency anemia secondary to blood loss (chronic): Secondary | ICD-10-CM | POA: Diagnosis not present

## 2018-04-15 DIAGNOSIS — I5032 Chronic diastolic (congestive) heart failure: Secondary | ICD-10-CM | POA: Diagnosis not present

## 2018-04-15 DIAGNOSIS — M1612 Unilateral primary osteoarthritis, left hip: Secondary | ICD-10-CM | POA: Diagnosis not present

## 2018-04-16 NOTE — Telephone Encounter (Signed)
Faxed back to [facility]

## 2018-04-16 NOTE — Telephone Encounter (Signed)
Singed and on your desk as we discussed

## 2018-04-17 DIAGNOSIS — E0580 Other thyrotoxicosis without thyrotoxic crisis or storm: Secondary | ICD-10-CM | POA: Diagnosis not present

## 2018-04-17 DIAGNOSIS — I110 Hypertensive heart disease with heart failure: Secondary | ICD-10-CM | POA: Diagnosis not present

## 2018-04-17 DIAGNOSIS — D500 Iron deficiency anemia secondary to blood loss (chronic): Secondary | ICD-10-CM | POA: Diagnosis not present

## 2018-04-17 DIAGNOSIS — E785 Hyperlipidemia, unspecified: Secondary | ICD-10-CM | POA: Diagnosis not present

## 2018-04-17 DIAGNOSIS — M1612 Unilateral primary osteoarthritis, left hip: Secondary | ICD-10-CM | POA: Diagnosis not present

## 2018-04-17 DIAGNOSIS — I5032 Chronic diastolic (congestive) heart failure: Secondary | ICD-10-CM | POA: Diagnosis not present

## 2018-04-18 NOTE — Progress Notes (Signed)
Chief Complaint  Patient presents with  . Annual Exam    Pt has concerns today   . Medication Management  . Hypertension  . Anemia    HPI: [name] 85 y.o. come in for Chronic disease management   Here with daughter   Anemia  No bleeding no infusion buit  Taking  Oral iron   And feels ok   To have 1 mos fu iron hg  Due   Per dr Loletha Carrow   Thyroid  Doesn't want to do RAI    Taking a vit supplement    Has fu in a month or so     Not sure she want sot do the surgery delayed for the anemia   Noted  Right shoulder cannot lift  Above   Ever since fall in  October had neg x ray but  Not addressed   In fu  No new pain   ? Order for pt from [facility]   Edema about the same not using stockings but has them   Resp    ok no flares no cp sob   Saw cards and  okl for surgery  Decreased the amlodipine and doing well  No uti sx no new incompetence.   ROS: See pertinent positives and negatives per HPI. No active no fall depression bleeding    Past Medical History:  Diagnosis Date  . Allergy   . Anemia 2011   Since last visit she has had a Gi evaluation for newer onset Iron deficiiency anemia that was felt secondary to avm in duodenum rx with laser ablation.  . Arthritis    in back  . Asthma   . Cancer ([place])    rt breast  . GERD (gastroesophageal reflux disease)   . Hyperlipidemia   . Hypertension   . Lower extremity edema    ankles, feet   . Sleep apnea     Family History  Problem Relation Age of Onset  . Cancer Mother        breast and ovarian    Social History   Socioeconomic History  . Marital status: Widowed    Spouse name: Not on file  . Number of children: Not on file  . Years of education: Not on file  . Highest education level: Not on file  Occupational History  . Not on file  Social Needs  . Financial resource strain: Not on file  . Food insecurity:    Worry: Not on file    Inability: Not on file  . Transportation needs:    Medical: Not on file   Non-medical: Not on file  Tobacco Use  . Smoking status: Former Smoker    Packs/day: 0.25    Years: 4.00    Pack years: 1.00    Types: Cigarettes    Last attempt to quit: 03/05/1968    Years since quitting: 50.1  . Smokeless tobacco: Never Used  Substance and Sexual Activity  . Alcohol use: Not Currently    Comment: on occ   . Drug use: No  . Sexual activity: Not on file  Lifestyle  . Physical activity:    Days per week: Not on file    Minutes per session: Not on file  . Stress: Not on file  Relationships  . Social connections:    Talks on phone: Not on file    Gets together: Not on file    Attends religious service: Not on file  Active member of club or organization: Not on file    Attends meetings of clubs or organizations: Not on file    Relationship status: Not on file  Other Topics Concern  . Not on file  Social History Narrative   Widowed 2001    Ex smoker   HHof 1    Active in church and volunteer activities    [place] a few times per week   Swimming 2 x per week.    Tobacco 49=58 less than 1ppd.              Outpatient Medications Prior to Visit  Medication Sig Dispense Refill  . amLODipine (NORVASC) 5 MG tablet Take 1 tablet (5 mg total) by mouth daily. 90 tablet 3  . budesonide-formoterol (SYMBICORT) 160-4.5 MCG/ACT inhaler Inhale 2 puffs into the lungs 2 (two) times daily as needed (Shortness of breath).     . fexofenadine (ALLEGRA) 180 MG tablet Take 180 mg by mouth daily as needed for allergies or rhinitis.    Marland Kitchen lisinopril-hydrochlorothiazide (PRINZIDE,ZESTORETIC) 20-12.5 MG tablet Take 1 tablet by mouth 2 (two) times daily. 180 tablet 1   No facility-administered medications prior to visit.      EXAM:  BP 128/66 (BP Location: Right Arm, Patient Position: Sitting, Cuff Size: Normal)   Pulse 87   Temp 97.7 F (36.5 C) (Oral)   Ht 5' (1.524 m)   Wt 144 lb 11.2 oz (65.6 kg)   BMI 28.26 kg/m   Body mass index is 28.26 kg/m.  GENERAL: vitals  reviewed and listed above, alert, oriented, appears well hydrated and in no acute distress HEENT: atraumatic, conjunctiva  clear, no obvious abnormalities on inspection of external nose and ears OP : no lesion edema or exudate  NECK: no obvious masses on inspection palpation  LUNGS: clear to auscultation bilaterally, no wheezes, rales or rhonchi, good air movement CV: HRRR, no clubbing cyanosis or  peripheral edema nl cap refill  MS: moves all extremities without noticeable focal  abnormality PSYCH: pleasant and cooperative, no obvious depression or anxiety Lab Results  Component Value Date   WBC 8.3 03/20/2018   HGB 9.4 (L) 03/20/2018   HCT 30.4 (L) 03/20/2018   PLT 494.0 (H) 03/20/2018   GLUCOSE 102 (H) 02/27/2018   CHOL 260 (H) 04/30/2017   TRIG 127.0 04/30/2017   HDL 72.00 04/30/2017   LDLCALC 163 (H) 04/30/2017   ALT 26 12/24/2017   AST 55 (H) 12/24/2017   NA 139 02/27/2018   K 3.8 02/27/2018   CL 103 02/27/2018   CREATININE 0.71 02/27/2018   BUN 20 02/27/2018   CO2 28 02/27/2018   TSH 0.11 (L) 02/21/2018   INR 1.03 02/27/2018   HGBA1C 5.6 04/30/2017   BP Readings from Last 3 Encounters:  04/21/18 128/66  03/20/18 116/60  03/03/18 132/64    ASSESSMENT AND PLAN:  Discussed the following assessment and plan:  Anemia, unspecified type - Plan: TSH, T4, free, CBC with Differential/Platelet, Ferritin, Hepatic function panel  Medication management - Plan: Hepatic function panel  Low TSH level - Plan: TSH, T4, free, CBC with Differential/Platelet, Ferritin, Hepatic function panel  Well-controlled hypertension  Chronic diastolic CHF (congestive heart failure) (HCC)  Subclinical hyperthyroidism  HX: breast cancer  Abnormal LFTs - Plan: TSH, T4, free, CBC with Differential/Platelet, Ferritin, Hepatic function panel  Hyperlipidemia, unspecified hyperlipidemia type - Plan: Lipid panel  Post-traumatic stiffness of right shoulder joint - dec rom like frozen sholder?    Disc shingles vaccine  Last ua shoed bacteriuria but patient denies sx and  ux not  Positive so  Will not  rx and have her  Recheck if any sx  Will get updated anemia thyroid   labs   Shoulder  Advised  Seeing ortho for best intervention  ? frozen shoulder   May benefit from intervention  Daughter will get  appt with dr Deneen Harts al  For eval   Total visit 25mins > 50% spent counseling and coordinating care as indicated in above note and in instructions to patient .   -Patient advised to return or notify health care team  if  new concerns arise.  Patient Instructions   Get ortho to check the right shoulder   For reasons as discussed    Need evaluation after  injury and  Optimize  Shoulder function.  Checking    Anemia and thyroid today  Will send results to GI dr Loletha Carrow   Advise getting shingrix   Shingles vaccine  At your pharmacy for reasons discussed .    Let us know if you get any uti sx  Or concern and we can recheck urine  For infection   Get regular  Dental checks.    Plan fu dependin on labs and how doing  Or 6 mos usually      Mariann Laster K. Yula Crotwell M.D.

## 2018-04-21 ENCOUNTER — Encounter: Payer: Self-pay | Admitting: Internal Medicine

## 2018-04-21 ENCOUNTER — Ambulatory Visit (INDEPENDENT_AMBULATORY_CARE_PROVIDER_SITE_OTHER): Payer: Medicare Other | Admitting: Internal Medicine

## 2018-04-21 VITALS — BP 128/66 | HR 87 | Temp 97.7°F | Ht 60.0 in | Wt 144.7 lb

## 2018-04-21 DIAGNOSIS — I10 Essential (primary) hypertension: Secondary | ICD-10-CM | POA: Diagnosis not present

## 2018-04-21 DIAGNOSIS — E785 Hyperlipidemia, unspecified: Secondary | ICD-10-CM | POA: Diagnosis not present

## 2018-04-21 DIAGNOSIS — R945 Abnormal results of liver function studies: Secondary | ICD-10-CM

## 2018-04-21 DIAGNOSIS — M25611 Stiffness of right shoulder, not elsewhere classified: Secondary | ICD-10-CM | POA: Diagnosis not present

## 2018-04-21 DIAGNOSIS — Z79899 Other long term (current) drug therapy: Secondary | ICD-10-CM

## 2018-04-21 DIAGNOSIS — R7989 Other specified abnormal findings of blood chemistry: Secondary | ICD-10-CM | POA: Diagnosis not present

## 2018-04-21 DIAGNOSIS — D649 Anemia, unspecified: Secondary | ICD-10-CM | POA: Diagnosis not present

## 2018-04-21 DIAGNOSIS — Z853 Personal history of malignant neoplasm of breast: Secondary | ICD-10-CM

## 2018-04-21 DIAGNOSIS — I5032 Chronic diastolic (congestive) heart failure: Secondary | ICD-10-CM

## 2018-04-21 DIAGNOSIS — E0590 Thyrotoxicosis, unspecified without thyrotoxic crisis or storm: Secondary | ICD-10-CM | POA: Diagnosis not present

## 2018-04-21 LAB — CBC WITH DIFFERENTIAL/PLATELET
Basophils Absolute: 0 10*3/uL (ref 0.0–0.1)
Basophils Relative: 0.5 % (ref 0.0–3.0)
Eosinophils Absolute: 0.1 10*3/uL (ref 0.0–0.7)
Eosinophils Relative: 1.7 % (ref 0.0–5.0)
HCT: 30.9 % — ABNORMAL LOW (ref 36.0–46.0)
Hemoglobin: 9.6 g/dL — ABNORMAL LOW (ref 12.0–15.0)
Lymphocytes Relative: 16.1 % (ref 12.0–46.0)
Lymphs Abs: 1.2 10*3/uL (ref 0.7–4.0)
MCHC: 31 g/dL (ref 30.0–36.0)
MCV: 72.2 fl — ABNORMAL LOW (ref 78.0–100.0)
Monocytes Absolute: 0.6 10*3/uL (ref 0.1–1.0)
Monocytes Relative: 8.4 % (ref 3.0–12.0)
Neutro Abs: 5.6 10*3/uL (ref 1.4–7.7)
Neutrophils Relative %: 73.3 % (ref 43.0–77.0)
Platelets: 485 10*3/uL — ABNORMAL HIGH (ref 150.0–400.0)
RBC: 4.27 Mil/uL (ref 3.87–5.11)
RDW: 19.8 % — ABNORMAL HIGH (ref 11.5–15.5)
WBC: 7.7 10*3/uL (ref 4.0–10.5)

## 2018-04-21 LAB — HEPATIC FUNCTION PANEL
ALT: 10 U/L (ref 0–35)
AST: 15 U/L (ref 0–37)
Albumin: 4.1 g/dL (ref 3.5–5.2)
Alkaline Phosphatase: 72 U/L (ref 39–117)
Bilirubin, Direct: 0.1 mg/dL (ref 0.0–0.3)
Total Bilirubin: 0.3 mg/dL (ref 0.2–1.2)
Total Protein: 7 g/dL (ref 6.0–8.3)

## 2018-04-21 LAB — LIPID PANEL
Cholesterol: 222 mg/dL — ABNORMAL HIGH (ref 0–200)
HDL: 77.4 mg/dL (ref >39.00)
LDL Cholesterol: 124 mg/dL — ABNORMAL HIGH (ref 0–99)
NonHDL: 144.97
Total CHOL/HDL Ratio: 3
Triglycerides: 106 mg/dL (ref 0.0–149.0)
VLDL: 21.2 mg/dL (ref 0.0–40.0)

## 2018-04-21 NOTE — Patient Instructions (Addendum)
   Get ortho to check the right shoulder   For reasons as discussed    Need evaluation after  injury and  Optimize  Shoulder function.  Checking    Anemia and thyroid today  Will send results to GI dr Loletha Carrow   Advise getting shingrix   Shingles vaccine  At your pharmacy for reasons discussed .    Let us know if you get any uti sx  Or concern and we can recheck urine  For infection   Get regular  Dental checks.    Plan fu dependin on labs and how doing  Or 6 mos usually

## 2018-04-22 DIAGNOSIS — M1612 Unilateral primary osteoarthritis, left hip: Secondary | ICD-10-CM | POA: Diagnosis not present

## 2018-04-22 DIAGNOSIS — I110 Hypertensive heart disease with heart failure: Secondary | ICD-10-CM | POA: Diagnosis not present

## 2018-04-22 DIAGNOSIS — E0580 Other thyrotoxicosis without thyrotoxic crisis or storm: Secondary | ICD-10-CM | POA: Diagnosis not present

## 2018-04-22 DIAGNOSIS — I5032 Chronic diastolic (congestive) heart failure: Secondary | ICD-10-CM | POA: Diagnosis not present

## 2018-04-22 DIAGNOSIS — D500 Iron deficiency anemia secondary to blood loss (chronic): Secondary | ICD-10-CM | POA: Diagnosis not present

## 2018-04-22 DIAGNOSIS — E785 Hyperlipidemia, unspecified: Secondary | ICD-10-CM | POA: Diagnosis not present

## 2018-04-22 LAB — T4, FREE: Free T4: 0.77 ng/dL (ref 0.60–1.60)

## 2018-04-22 LAB — FERRITIN: Ferritin: 6.9 ng/mL — ABNORMAL LOW (ref 10.0–291.0)

## 2018-04-22 LAB — TSH: TSH: 0.47 u[IU]/mL (ref 0.35–4.50)

## 2018-04-24 DIAGNOSIS — E785 Hyperlipidemia, unspecified: Secondary | ICD-10-CM | POA: Diagnosis not present

## 2018-04-24 DIAGNOSIS — M1612 Unilateral primary osteoarthritis, left hip: Secondary | ICD-10-CM | POA: Diagnosis not present

## 2018-04-24 DIAGNOSIS — I5032 Chronic diastolic (congestive) heart failure: Secondary | ICD-10-CM | POA: Diagnosis not present

## 2018-04-24 DIAGNOSIS — E0580 Other thyrotoxicosis without thyrotoxic crisis or storm: Secondary | ICD-10-CM | POA: Diagnosis not present

## 2018-04-24 DIAGNOSIS — I110 Hypertensive heart disease with heart failure: Secondary | ICD-10-CM | POA: Diagnosis not present

## 2018-04-24 DIAGNOSIS — D500 Iron deficiency anemia secondary to blood loss (chronic): Secondary | ICD-10-CM | POA: Diagnosis not present

## 2018-04-28 DIAGNOSIS — M19011 Primary osteoarthritis, right shoulder: Secondary | ICD-10-CM | POA: Diagnosis not present

## 2018-05-01 DIAGNOSIS — E785 Hyperlipidemia, unspecified: Secondary | ICD-10-CM | POA: Diagnosis not present

## 2018-05-01 DIAGNOSIS — M1612 Unilateral primary osteoarthritis, left hip: Secondary | ICD-10-CM | POA: Diagnosis not present

## 2018-05-01 DIAGNOSIS — I5032 Chronic diastolic (congestive) heart failure: Secondary | ICD-10-CM | POA: Diagnosis not present

## 2018-05-01 DIAGNOSIS — E0580 Other thyrotoxicosis without thyrotoxic crisis or storm: Secondary | ICD-10-CM | POA: Diagnosis not present

## 2018-05-01 DIAGNOSIS — I110 Hypertensive heart disease with heart failure: Secondary | ICD-10-CM | POA: Diagnosis not present

## 2018-05-01 DIAGNOSIS — D500 Iron deficiency anemia secondary to blood loss (chronic): Secondary | ICD-10-CM | POA: Diagnosis not present

## 2018-05-05 ENCOUNTER — Telehealth: Payer: Self-pay

## 2018-05-05 NOTE — Telephone Encounter (Signed)
Faxed back to [facility]

## 2018-05-05 NOTE — Telephone Encounter (Signed)
HH orders  And certification signed

## 2018-05-05 NOTE — Telephone Encounter (Signed)
Paper work form health for you to sign in red folder

## 2018-05-08 DIAGNOSIS — M1612 Unilateral primary osteoarthritis, left hip: Secondary | ICD-10-CM | POA: Diagnosis not present

## 2018-05-08 DIAGNOSIS — E0580 Other thyrotoxicosis without thyrotoxic crisis or storm: Secondary | ICD-10-CM | POA: Diagnosis not present

## 2018-05-08 DIAGNOSIS — I5032 Chronic diastolic (congestive) heart failure: Secondary | ICD-10-CM | POA: Diagnosis not present

## 2018-05-08 DIAGNOSIS — E785 Hyperlipidemia, unspecified: Secondary | ICD-10-CM | POA: Diagnosis not present

## 2018-05-08 DIAGNOSIS — D500 Iron deficiency anemia secondary to blood loss (chronic): Secondary | ICD-10-CM | POA: Diagnosis not present

## 2018-05-08 DIAGNOSIS — I110 Hypertensive heart disease with heart failure: Secondary | ICD-10-CM | POA: Diagnosis not present

## 2018-05-09 DIAGNOSIS — D500 Iron deficiency anemia secondary to blood loss (chronic): Secondary | ICD-10-CM | POA: Diagnosis not present

## 2018-05-09 DIAGNOSIS — M479 Spondylosis, unspecified: Secondary | ICD-10-CM | POA: Diagnosis not present

## 2018-05-09 DIAGNOSIS — I5032 Chronic diastolic (congestive) heart failure: Secondary | ICD-10-CM | POA: Diagnosis not present

## 2018-05-09 DIAGNOSIS — I110 Hypertensive heart disease with heart failure: Secondary | ICD-10-CM | POA: Diagnosis not present

## 2018-05-09 DIAGNOSIS — M1612 Unilateral primary osteoarthritis, left hip: Secondary | ICD-10-CM | POA: Diagnosis not present

## 2018-05-09 DIAGNOSIS — Z87891 Personal history of nicotine dependence: Secondary | ICD-10-CM | POA: Diagnosis not present

## 2018-05-09 DIAGNOSIS — E785 Hyperlipidemia, unspecified: Secondary | ICD-10-CM | POA: Diagnosis not present

## 2018-05-09 DIAGNOSIS — J45909 Unspecified asthma, uncomplicated: Secondary | ICD-10-CM | POA: Diagnosis not present

## 2018-05-09 DIAGNOSIS — K219 Gastro-esophageal reflux disease without esophagitis: Secondary | ICD-10-CM | POA: Diagnosis not present

## 2018-05-09 DIAGNOSIS — Z853 Personal history of malignant neoplasm of breast: Secondary | ICD-10-CM | POA: Diagnosis not present

## 2018-05-09 DIAGNOSIS — E0580 Other thyrotoxicosis without thyrotoxic crisis or storm: Secondary | ICD-10-CM | POA: Diagnosis not present

## 2018-05-09 DIAGNOSIS — Z9181 History of falling: Secondary | ICD-10-CM | POA: Diagnosis not present

## 2018-05-16 DIAGNOSIS — E0580 Other thyrotoxicosis without thyrotoxic crisis or storm: Secondary | ICD-10-CM | POA: Diagnosis not present

## 2018-05-16 DIAGNOSIS — I110 Hypertensive heart disease with heart failure: Secondary | ICD-10-CM | POA: Diagnosis not present

## 2018-05-16 DIAGNOSIS — D500 Iron deficiency anemia secondary to blood loss (chronic): Secondary | ICD-10-CM | POA: Diagnosis not present

## 2018-05-16 DIAGNOSIS — I5032 Chronic diastolic (congestive) heart failure: Secondary | ICD-10-CM | POA: Diagnosis not present

## 2018-05-16 DIAGNOSIS — M1612 Unilateral primary osteoarthritis, left hip: Secondary | ICD-10-CM | POA: Diagnosis not present

## 2018-05-16 DIAGNOSIS — E785 Hyperlipidemia, unspecified: Secondary | ICD-10-CM | POA: Diagnosis not present

## 2018-05-20 DIAGNOSIS — M1612 Unilateral primary osteoarthritis, left hip: Secondary | ICD-10-CM | POA: Diagnosis not present

## 2018-05-20 DIAGNOSIS — I5032 Chronic diastolic (congestive) heart failure: Secondary | ICD-10-CM | POA: Diagnosis not present

## 2018-05-20 DIAGNOSIS — D500 Iron deficiency anemia secondary to blood loss (chronic): Secondary | ICD-10-CM | POA: Diagnosis not present

## 2018-05-20 DIAGNOSIS — E785 Hyperlipidemia, unspecified: Secondary | ICD-10-CM | POA: Diagnosis not present

## 2018-05-20 DIAGNOSIS — I110 Hypertensive heart disease with heart failure: Secondary | ICD-10-CM | POA: Diagnosis not present

## 2018-05-20 DIAGNOSIS — E0580 Other thyrotoxicosis without thyrotoxic crisis or storm: Secondary | ICD-10-CM | POA: Diagnosis not present

## 2018-05-28 ENCOUNTER — Ambulatory Visit: Payer: Medicare Other | Admitting: Cardiovascular Disease

## 2018-05-28 ENCOUNTER — Telehealth: Payer: Self-pay | Admitting: Internal Medicine

## 2018-05-28 NOTE — Telephone Encounter (Signed)
Approve orders   As requested . hidp and knee arthritis  Hx of fall   Anemia.   [place] please   Also  arrange for her to get fu of her cbc diff ferritin   ibc  Dx  Anemia   in    Another month and have her continue her iron.

## 2018-05-28 NOTE — Telephone Encounter (Signed)
Copied from [place] [id number]. Topic: Quick Communication - Home Health Verbal Orders >> May 28, 2018 11:01 AM Berneta Levins wrote: Caller/Agency: Marden Noble with [facility] Number: [phone] Requesting OT/PT/Skilled Nursing/Social Work/Speech Therapy: PT Frequency: extend orders for 1x this week and 2x next week

## 2018-05-30 ENCOUNTER — Ambulatory Visit: Payer: Medicare Other | Admitting: Internal Medicine

## 2018-05-30 ENCOUNTER — Other Ambulatory Visit: Payer: Self-pay

## 2018-05-30 DIAGNOSIS — D649 Anemia, unspecified: Secondary | ICD-10-CM

## 2018-05-30 NOTE — Telephone Encounter (Signed)
Pt daughter has been notified of lab orders

## 2018-05-30 NOTE — Telephone Encounter (Signed)
[name] with home health here community she is living in is not allowing them to come in at this time

## 2018-06-02 ENCOUNTER — Telehealth: Payer: Self-pay

## 2018-06-02 NOTE — Telephone Encounter (Signed)
Is spam prescription not sending in was sent through fax

## 2018-06-02 NOTE — Telephone Encounter (Signed)
Cyclobenzaprine 7.5mg  tablets, qid Medication is not on current med list, ok to fill?

## 2018-06-04 ENCOUNTER — Telehealth: Payer: Self-pay | Admitting: Internal Medicine

## 2018-06-04 NOTE — Telephone Encounter (Signed)
Copied from [place] [id number]. Topic: Quick Communication - Home Health Verbal Orders >> Jun 04, 2018  2:16 PM Percell Belt A wrote: Caller/Agency: Earnest Bailey with [facility] Number: [phone] Requesting OT/PT/Skilled Nursing/Social Work/Speech Therapy: [facility] has place all home health services on hold. So they are requesting to place referral on hold.

## 2018-06-05 NOTE — Telephone Encounter (Signed)
Have already placed on hold

## 2018-06-06 ENCOUNTER — Ambulatory Visit: Payer: Medicare Other | Admitting: Internal Medicine

## 2018-06-25 ENCOUNTER — Other Ambulatory Visit: Payer: Medicare Other

## 2018-06-25 NOTE — Telephone Encounter (Signed)
Copied from [place] [id number]. Topic: General - Other >> Jun 24, 2018  4:55 PM Valla Leaver wrote: Reason for CRM: Christy RN at Patient's home, at assisted living, are calling to notify that the patient cant leave the home due to [place] and they can draw the labs there if the odrers are sent to them. FAX: [phone] ATTN: Wellness. Daughter, Mateo Flow would like a call at [phone].

## 2018-06-30 NOTE — Telephone Encounter (Signed)
Fax order for lab cbc diff,  ibc panel and ferritin  Dx iron deficiency anemia .  Written order  Placed on your desk

## 2018-06-30 NOTE — Telephone Encounter (Signed)
Fax sent and confirmed.  Daughter is aware and would like a possible Doxy.me appointment when the results are back.

## 2018-07-01 DIAGNOSIS — D509 Iron deficiency anemia, unspecified: Secondary | ICD-10-CM | POA: Diagnosis not present

## 2018-07-01 LAB — CBC AND DIFFERENTIAL
HCT: 28 — AB (ref 36–46)
Hemoglobin: 8.8 — AB (ref 12.0–16.0)
Neutrophils Absolute: 4092
Platelets: 366 (ref 150–399)
WBC: 6

## 2018-07-01 LAB — IRON,TIBC AND FERRITIN PANEL
%SAT: 6
Ferritin: 6
Iron: 17
TIBC: 292

## 2018-07-08 ENCOUNTER — Encounter: Payer: Self-pay | Admitting: Internal Medicine

## 2018-07-08 ENCOUNTER — Telehealth: Payer: Self-pay

## 2018-07-08 ENCOUNTER — Other Ambulatory Visit: Payer: Self-pay

## 2018-07-08 ENCOUNTER — Ambulatory Visit (INDEPENDENT_AMBULATORY_CARE_PROVIDER_SITE_OTHER): Payer: Medicare Other | Admitting: Internal Medicine

## 2018-07-08 ENCOUNTER — Telehealth: Payer: Self-pay | Admitting: Internal Medicine

## 2018-07-08 DIAGNOSIS — D649 Anemia, unspecified: Secondary | ICD-10-CM | POA: Diagnosis not present

## 2018-07-08 DIAGNOSIS — E611 Iron deficiency: Secondary | ICD-10-CM

## 2018-07-08 DIAGNOSIS — Z79899 Other long term (current) drug therapy: Secondary | ICD-10-CM

## 2018-07-08 MED ORDER — FERROUS SULFATE 325 (65 FE) MG PO TABS: ORAL_TABLET | ORAL | 3 refills | Status: DC

## 2018-07-08 NOTE — Progress Notes (Signed)
Virtual Visit via Video Note  I connected with@ on 07/08/18 at  1:15 PM EDT by a video enabled telemedicine application and verified that I am speaking with the correct person using two identifiers. Location patient: home Location provider:work or home office Persons participating in the virtual visit:   Daughter valerie video and  phone with patient ( 3 way)   [place] national recommendations  regarding COVID 19 pandemic   video visit is advised over in office visit for this patient.  Discussed the limitations of evaluation and management by telemedicine and  availability of in person appointments. The patient expressed understanding and agreed to proceed.   HPI: [place] her daughter Clova Morlock for lab anemia   iron deficient    In [place]    Some one shuttered in .  Says feels fine and no syncope dizziness  No bleeding . Says taking iron otc once a day but  Not on med list ( cause otc) and took a while to find the bottle  She  Prefers to take her own medication. Has declined ferriheme in the past  To see how does.  ROS: See pertinent positives and negatives per HPI.  Past Medical History:  Diagnosis Date  . Allergy   . Anemia 2011   Since last visit she has had a Gi evaluation for newer onset Iron deficiiency anemia that was felt secondary to avm in duodenum rx with laser ablation.  . Arthritis    in back  . Asthma   . Cancer ([place])    rt breast  . GERD (gastroesophageal reflux disease)   . Hyperlipidemia   . Hypertension   . Lower extremity edema    ankles, feet   . Sleep apnea     Past Surgical History:  Procedure Laterality Date  . BREAST LUMPECTOMY  04/26/2010   right  . COLONOSCOPY WITH PROPOFOL N/A 09/14/2013   Procedure: COLONOSCOPY WITH PROPOFOL;  Surgeon: Inda Castle, MD;  Location: [facility];  Service: Endoscopy;  Laterality: N/A;  . HOT HEMOSTASIS N/A 09/14/2013   Procedure: HOT HEMOSTASIS (ARGON PLASMA  COAGULATION/BICAP);  Surgeon: Inda Castle, MD;  Location: [facility];  Service: Endoscopy;  Laterality: N/A;  . LARYNGECTOMY  20-25 yrs ago  . PARTIAL HIP ARTHROPLASTY     rt    Family History  Problem Relation Age of Onset  . Cancer Mother        breast and ovarian    Social History   Tobacco Use  . Smoking status: Former Smoker    Packs/day: 0.25    Years: 4.00    Pack years: 1.00    Types: Cigarettes    Last attempt to quit: 03/05/1968    Years since quitting: 50.3  . Smokeless tobacco: Never Used  Substance Use Topics  . Alcohol use: Not Currently    Comment: on occ   . Drug use: No      Current Outpatient Medications:  .  amLODipine (NORVASC) 5 MG tablet, Take 1 tablet (5 mg total) by mouth daily., Disp: 90 tablet, Rfl: 3 .  budesonide-formoterol (SYMBICORT) 160-4.5 MCG/ACT inhaler, Inhale 2 puffs into the lungs 2 (two) times daily as needed (Shortness of breath). , Disp: , Rfl:  .  ferrous sulfate 325 (65 FE) MG tablet, Take 1 po bid with 1/2 glass of orange juice Monday  Wednesday  Friday and Sunday at least 30-60  minutes before a meal ., Disp: 30  tablet, Rfl: 3 .  fexofenadine (ALLEGRA) 180 MG tablet, Take 180 mg by mouth daily as needed for allergies or rhinitis., Disp: , Rfl:  .  lisinopril-hydrochlorothiazide (PRINZIDE,ZESTORETIC) 20-12.5 MG tablet, Take 1 tablet by mouth 2 (two) times daily., Disp: 180 tablet, Rfl: 1  EXAM: BP Readings from Last 3 Encounters:  04/21/18 128/66  03/20/18 116/60  03/03/18 132/64    VITALS per patient if applicable:  GENERAL: alert, oriented, appears well and in no acute distress unable to see her but her daughter  But sounds  Normal and cognitively able    LUNGS:  no signs of respiratory distress, breathing rate appears normal, no obvious gross SOB, gasping or wheezing  PSYCH/NEURO: pleasant and cooperative, no obvious depression or anxiety, speech and thought processing grossly intact Lab Results  Component Value  Date   WBC 6.0 07/01/2018   HGB 8.8 (A) 07/01/2018   HCT 28 (A) 07/01/2018   PLT 366 07/01/2018   GLUCOSE 102 (H) 02/27/2018   CHOL 222 (H) 04/21/2018   TRIG 106.0 04/21/2018   HDL 77.40 04/21/2018   LDLCALC 124 (H) 04/21/2018   ALT 10 04/21/2018   AST 15 04/21/2018   NA 139 02/27/2018   K 3.8 02/27/2018   CL 103 02/27/2018   CREATININE 0.71 02/27/2018   BUN 20 02/27/2018   CO2 28 02/27/2018   TSH 0.47 04/21/2018   INR 1.03 02/27/2018   HGBA1C 5.6 04/30/2017    ASSESSMENT AND PLAN:  Discussed the following assessment and plan:  Anemia, unspecified type  Iron deficiency  Medication management Hg has dropped from 9.6- 8.8.    But "feels fine" .   Disc taking iron  initially daily but best to take   Bid every other day with OJ to see if can get better absorption   She prefers not to do the iron infusion   Some logistics with the shut down.   Plan 6 weeks  Recheck blood count.  And if not improving  Still advise  Iron infusion    Will send  rx   Plan meds to  Facility .   Disc this with Mateo Flow for her to relate to her mom  ( technical difficulties  )  Counseled.  Plan 6 weeks  cbcdiff  And then  Plan visit or fu   Expectant management and discussion of plan and treatment with patient with opportunity to ask questions and all were answered. The patient agreed with the plan and demonstrated an understanding of the instructions.  Total visit 81mins > 50% spent counseling and coordinating care as indicated in above note and in instructions to patient .  And daughter.  The patient was advised to call back or seek an in-person evaluation if worsening  or having concerns .  Shanon Ace, MD

## 2018-07-08 NOTE — Telephone Encounter (Signed)
Home health orders for you to sign in red folder

## 2018-07-08 NOTE — Telephone Encounter (Signed)
Pt has virtual visit today per daughters request

## 2018-07-08 NOTE — Telephone Encounter (Addendum)
Hg  down to 8.8 again   Please contact daughter and patient    Confirm how much iron she is taking  May need doxy appt  See below They had declined  iron infusion in past   But stil; sjp;d consider this     ( not sure how to arrange this with  the current  Restrictions )

## 2018-07-08 NOTE — Telephone Encounter (Signed)
Signed.

## 2018-07-08 NOTE — Patient Instructions (Signed)
Begin iron as RX to ensure  Adequate support.   And plan cbc diff in about 6 weeks   May need iron infusion ferriheme.

## 2018-07-08 NOTE — Telephone Encounter (Signed)
Opened in erroro

## 2018-07-23 ENCOUNTER — Telehealth: Payer: Self-pay | Admitting: *Deleted

## 2018-07-23 NOTE — Telephone Encounter (Signed)
Copied from [place] [id number]. Topic: Quick Communication - Rx Refill/Question >> Jul 23, 2018 10:24 AM Burchel, Abbi R wrote: Medication: ferrous sulfate 325 (65 FE) MG tablet  Pt requesting a call back re: frequency this medication is being given to her.    [phone]

## 2018-07-23 NOTE — Telephone Encounter (Signed)
Called to clarify instructions, she was in a meeting and stated she would call back.  Current Sig on the Rx: Sig: Take 1 po bid with 1/2 glass of orange juice Monday Wednesday Friday and Sunday at least 30-60 minutes before a meal

## 2018-08-04 ENCOUNTER — Other Ambulatory Visit: Payer: Self-pay | Admitting: Internal Medicine

## 2018-08-05 ENCOUNTER — Telehealth: Payer: Self-pay | Admitting: *Deleted

## 2018-08-05 ENCOUNTER — Other Ambulatory Visit: Payer: Self-pay

## 2018-08-05 MED ORDER — FERROUS SULFATE 325 (65 FE) MG PO TABS: ORAL_TABLET | ORAL | 3 refills | Status: DC

## 2018-08-05 MED ORDER — AMLODIPINE BESYLATE 5 MG PO TABS: 5.0000 mg | ORAL_TABLET | Freq: Every day | ORAL | 3 refills | Status: DC

## 2018-08-05 NOTE — Telephone Encounter (Signed)
Copied from [place] [id number]. Topic: General - Inquiry >> Aug 05, 2018  1:18 PM Mathis Bud wrote: Reason for CRM: patient daughter called stating patient will be relocating temportality and would like PCP nurse to give her a call back.  Mateo Flow (patients daughter call back # [phone]

## 2018-08-05 NOTE — Telephone Encounter (Signed)
Pt daughter states blood test will have to be postponed and do a televisit next follow up Pt is leaving Friday

## 2018-08-05 NOTE — Telephone Encounter (Signed)
Pt daughter states pt is relocating to [place] for a bit

## 2018-08-08 DIAGNOSIS — M1612 Unilateral primary osteoarthritis, left hip: Secondary | ICD-10-CM | POA: Diagnosis not present

## 2018-08-08 DIAGNOSIS — M25552 Pain in left hip: Secondary | ICD-10-CM | POA: Diagnosis not present

## 2018-08-14 ENCOUNTER — Telehealth: Payer: Self-pay

## 2018-08-14 NOTE — Telephone Encounter (Signed)
Form in red folder to be signed about covid test

## 2018-08-15 NOTE — Telephone Encounter (Signed)
Signed and sent to scan

## 2018-09-03 ENCOUNTER — Telehealth: Payer: Self-pay

## 2018-09-03 DIAGNOSIS — D649 Anemia, unspecified: Secondary | ICD-10-CM

## 2018-09-03 NOTE — Telephone Encounter (Signed)
Copied from [place] [id number]. Topic: General - Other >> Sep 02, 2018  4:04 PM Alanda Slim E wrote: Reason for CRM: Pt was to have a 6 weeks f/u and labs. Pt has temporarily relocated to [place], [place] and needs lab orders sent to the [facility] in [place], [place], [place], [place] phone: [phone] please call Mateo Flow when orders have been sent @ [phone]

## 2018-09-04 NOTE — Telephone Encounter (Signed)
Please send order  For cbcdiff  Iron IBC and ferritin   For dx iron deficiency anemia   .  To facility requested

## 2018-09-09 ENCOUNTER — Other Ambulatory Visit: Payer: Self-pay

## 2018-09-09 DIAGNOSIS — D649 Anemia, unspecified: Secondary | ICD-10-CM

## 2018-09-09 NOTE — Telephone Encounter (Signed)
Sent faxed orders in

## 2018-09-09 NOTE — Telephone Encounter (Signed)
Faxed and sent to scan.

## 2018-09-09 NOTE — Progress Notes (Signed)
Unable to print off orders

## 2018-09-15 DIAGNOSIS — D509 Iron deficiency anemia, unspecified: Secondary | ICD-10-CM | POA: Diagnosis not present

## 2018-09-15 LAB — IRON,TIBC AND FERRITIN PANEL
%SAT: 14
Ferritin: 34
Iron: 40
TIBC: 284

## 2018-09-16 LAB — CBC AND DIFFERENTIAL
HCT: 39 (ref 36–46)
Hemoglobin: 12.7 (ref 12.0–16.0)
Neutrophils Absolute: 6
Platelets: 343 (ref 150–399)
WBC: 7.9

## 2018-09-22 NOTE — Progress Notes (Signed)
Virtual Visit via Video Note  I connected with@ on 09/23/18 at  1:15 PM EDT by a video enabled telemedicine application and verified that I am speaking with the correct person using two identifiers. Location patient: home Location provider:work or home office Persons participating in the virtual visit: patient, provider  (Daughter letitia  In back ground)  In [place].    [place] national recommendations  regarding COVID 19 pandemic   video visit is advised over in office visit for this patient.  Patient aware  of the limitations of evaluation and management by telemedicine and  availability of in person appointments. and agreed to proceed.   HPI: [name] presents for video visit  Fu anemia and med check  Since last visit she has been living in [place] with  One of her daughters   Taking medication iron etc .    doing well   Better energy .  No bleeding cp sob falling .  No cough  "Allergy eyes" only concern    ROS: See pertinent positives and negatives per HPI. No cough abd concernes   Past Medical History:  Diagnosis Date  . Allergy   . Anemia 2011   Since last visit she has had a Gi evaluation for newer onset Iron deficiiency anemia that was felt secondary to avm in duodenum rx with laser ablation.  . Arthritis    in back  . Asthma   . Cancer ([place])    rt breast  . GERD (gastroesophageal reflux disease)   . Hyperlipidemia   . Hypertension   . Lower extremity edema    ankles, feet   . Sleep apnea     Past Surgical History:  Procedure Laterality Date  . BREAST LUMPECTOMY  04/26/2010   right  . COLONOSCOPY WITH PROPOFOL N/A 09/14/2013   Procedure: COLONOSCOPY WITH PROPOFOL;  Surgeon: Inda Castle, MD;  Location: [facility];  Service: Endoscopy;  Laterality: N/A;  . HOT HEMOSTASIS N/A 09/14/2013   Procedure: HOT HEMOSTASIS (ARGON PLASMA COAGULATION/BICAP);  Surgeon: Inda Castle, MD;  Location: [facility];  Service: Endoscopy;  Laterality: N/A;  .  LARYNGECTOMY  20-25 yrs ago  . PARTIAL HIP ARTHROPLASTY     rt    Family History  Problem Relation Age of Onset  . Cancer Mother        breast and ovarian    Social History   Tobacco Use  . Smoking status: Former Smoker    Packs/day: 0.25    Years: 4.00    Pack years: 1.00    Types: Cigarettes    Quit date: 03/05/1968    Years since quitting: 50.5  . Smokeless tobacco: Never Used  Substance Use Topics  . Alcohol use: Not Currently    Comment: on occ   . Drug use: No      Current Outpatient Medications:  .  amLODipine (NORVASC) 5 MG tablet, Take 1 tablet (5 mg total) by mouth daily., Disp: 90 tablet, Rfl: 3 .  budesonide-formoterol (SYMBICORT) 160-4.5 MCG/ACT inhaler, Inhale 2 puffs into the lungs 2 (two) times daily as needed (Shortness of breath). , Disp: , Rfl:  .  ferrous sulfate 325 (65 FE) MG tablet, Take 1 po bid with 1/2 glass of orange juice Monday  Wednesday  Friday and Sunday at least 30-60  minutes before a meal ., Disp: 30 tablet, Rfl: 3 .  fexofenadine (ALLEGRA) 180 MG tablet, Take 180 mg by mouth daily as needed for allergies or rhinitis.,  Disp: , Rfl:  .  lisinopril-hydrochlorothiazide (ZESTORETIC) 20-12.5 MG tablet, TAKE ONE TABLET BY MOUTH TWO TIMES A DAY, Disp: 180 tablet, Rfl: 0  EXAM: BP Readings from Last 3 Encounters:  04/21/18 128/66  03/20/18 116/60  03/03/18 132/64    VITALS per patient if applicable:  Weight  416  bp 138/79   GENERAL: alert, oriented, appears well and in no acute distress non toxic looks well and groomed  Had some ciff recalling when and where lab was drawn.  HEENT: atraumatic, conjunttiva clear, no obvious abnormalities on inspection of external nose and ears NECK: normal movements of the head and neck LUNGS: on inspection no signs of respiratory distress, breathing rate appears normal, no obvious gross SOB, gasping or wheezing CV: no obvious cyanosis PSYCH/NEURO: pleasant and cooperative, no obvious depression or anxiety,  speech and thought processing grossly intact  duaghter to refresh memory about some facts  Lab Results  Component Value Date   WBC 7.9 09/15/2018   HGB 12.7 09/15/2018   HCT 39 09/15/2018   PLT 343 09/15/2018   GLUCOSE 102 (H) 02/27/2018   CHOL 222 (H) 04/21/2018   TRIG 106.0 04/21/2018   HDL 77.40 04/21/2018   LDLCALC 124 (H) 04/21/2018   ALT 10 04/21/2018   AST 15 04/21/2018   NA 139 02/27/2018   K 3.8 02/27/2018   CL 103 02/27/2018   CREATININE 0.71 02/27/2018   BUN 20 02/27/2018   CO2 28 02/27/2018   TSH 0.47 04/21/2018   INR 1.03 02/27/2018   HGBA1C 5.6 04/30/2017    ASSESSMENT AND PLAN:  Discussed the following assessment and plan:    ICD-10-CM   1. Iron deficiency anemia, unspecified iron deficiency anemia type  D50.9   2. Medication management  Z79.899   3. Iron deficiency  E61.1   4. Subclinical hyperthyroidism  E05.90    BP  Weight  much better  Suspect now taking med as indicated   With family supervision  Lab much better   doing much better  To stay in [place]  Until pandemic  Better    They will contact us for  Lab orders in 4-6 months and then virtual visit      Suspect mild memory impairment   . But seems quite well and more energetic      Plan :cbc diff and  Bmp  ibc ferritin panel.  In future   Stay on med   If bp gets too low then   Let  us know t o adjust medication  Counseled.   Expectant management and discussion of plan and treatment with opportunity to ask questions and all were answered. The patient agreed with the plan and demonstrated an understanding of the instructions.   Advised to call back or seek an in-person evaluation if worsening  or having  further concerns .   Shanon Ace, MD

## 2018-09-23 ENCOUNTER — Encounter: Payer: Self-pay | Admitting: Internal Medicine

## 2018-09-23 ENCOUNTER — Other Ambulatory Visit: Payer: Self-pay

## 2018-09-23 ENCOUNTER — Ambulatory Visit (INDEPENDENT_AMBULATORY_CARE_PROVIDER_SITE_OTHER): Payer: Medicare Other | Admitting: Internal Medicine

## 2018-09-23 DIAGNOSIS — E611 Iron deficiency: Secondary | ICD-10-CM

## 2018-09-23 DIAGNOSIS — Z79899 Other long term (current) drug therapy: Secondary | ICD-10-CM

## 2018-09-23 DIAGNOSIS — D509 Iron deficiency anemia, unspecified: Secondary | ICD-10-CM | POA: Diagnosis not present

## 2018-09-23 DIAGNOSIS — E0590 Thyrotoxicosis, unspecified without thyrotoxic crisis or storm: Secondary | ICD-10-CM | POA: Diagnosis not present

## 2018-10-30 DIAGNOSIS — Z9181 History of falling: Secondary | ICD-10-CM | POA: Diagnosis not present

## 2018-10-30 DIAGNOSIS — R350 Frequency of micturition: Secondary | ICD-10-CM | POA: Diagnosis not present

## 2018-10-30 DIAGNOSIS — M1612 Unilateral primary osteoarthritis, left hip: Secondary | ICD-10-CM | POA: Diagnosis not present

## 2018-10-30 DIAGNOSIS — Z111 Encounter for screening for respiratory tuberculosis: Secondary | ICD-10-CM | POA: Diagnosis not present

## 2018-10-30 DIAGNOSIS — R296 Repeated falls: Secondary | ICD-10-CM | POA: Diagnosis not present

## 2018-10-30 DIAGNOSIS — I10 Essential (primary) hypertension: Secondary | ICD-10-CM | POA: Diagnosis not present

## 2018-11-01 ENCOUNTER — Other Ambulatory Visit: Payer: Self-pay | Admitting: Internal Medicine

## 2018-11-03 DIAGNOSIS — Z96641 Presence of right artificial hip joint: Secondary | ICD-10-CM | POA: Diagnosis not present

## 2018-11-03 DIAGNOSIS — M1612 Unilateral primary osteoarthritis, left hip: Secondary | ICD-10-CM | POA: Diagnosis not present

## 2018-11-03 DIAGNOSIS — M47818 Spondylosis without myelopathy or radiculopathy, sacral and sacrococcygeal region: Secondary | ICD-10-CM | POA: Diagnosis not present

## 2018-11-03 DIAGNOSIS — M25559 Pain in unspecified hip: Secondary | ICD-10-CM | POA: Diagnosis not present

## 2018-11-17 DIAGNOSIS — M25552 Pain in left hip: Secondary | ICD-10-CM | POA: Diagnosis not present

## 2018-11-17 DIAGNOSIS — M25559 Pain in unspecified hip: Secondary | ICD-10-CM | POA: Diagnosis not present

## 2018-11-19 ENCOUNTER — Other Ambulatory Visit: Payer: Self-pay

## 2018-11-19 ENCOUNTER — Telehealth: Payer: Self-pay

## 2018-11-19 MED ORDER — LISINOPRIL-HYDROCHLOROTHIAZIDE 20-12.5 MG PO TABS: 1.0000 | ORAL_TABLET | Freq: Two times a day (BID) | ORAL | 0 refills | Status: DC

## 2018-11-19 NOTE — Telephone Encounter (Signed)
Refill has been sent in.  

## 2018-11-19 NOTE — Telephone Encounter (Signed)
Copied from [place] [id number]. Topic: Quick Communication - Rx Refill/Question >> Nov 19, 2018 10:26 AM [name] wrote: Medication       amLODipine (NORVASC) 5 MG tablet   lisinopril-hydrochlorothiazide (ZESTORETIC) 20-12.5 MG tablet   Pt has changed pharmacy and need new refills sent to her new pharmacy    [facility]: Please be advised that RX refills may take up to 3 business days. We ask that you follow-up with your pharmacy.

## 2018-11-20 NOTE — Telephone Encounter (Signed)
Message routed to PCP CMA  

## 2018-11-21 NOTE — Telephone Encounter (Signed)
This has already been sent in.

## 2018-11-21 NOTE — Telephone Encounter (Signed)
Medication already refilled.

## 2018-11-24 DIAGNOSIS — Z23 Encounter for immunization: Secondary | ICD-10-CM | POA: Diagnosis not present

## 2018-12-08 DIAGNOSIS — J301 Allergic rhinitis due to pollen: Secondary | ICD-10-CM | POA: Diagnosis not present

## 2018-12-08 DIAGNOSIS — H1013 Acute atopic conjunctivitis, bilateral: Secondary | ICD-10-CM | POA: Diagnosis not present

## 2018-12-08 DIAGNOSIS — J3089 Other allergic rhinitis: Secondary | ICD-10-CM | POA: Diagnosis not present

## 2018-12-08 DIAGNOSIS — J4520 Mild intermittent asthma, uncomplicated: Secondary | ICD-10-CM | POA: Diagnosis not present

## 2018-12-12 DIAGNOSIS — R399 Unspecified symptoms and signs involving the genitourinary system: Secondary | ICD-10-CM | POA: Diagnosis not present

## 2018-12-12 DIAGNOSIS — T7840XA Allergy, unspecified, initial encounter: Secondary | ICD-10-CM | POA: Diagnosis not present

## 2018-12-12 DIAGNOSIS — M25552 Pain in left hip: Secondary | ICD-10-CM | POA: Diagnosis not present

## 2018-12-12 DIAGNOSIS — I10 Essential (primary) hypertension: Secondary | ICD-10-CM | POA: Diagnosis not present

## 2018-12-12 DIAGNOSIS — L304 Erythema intertrigo: Secondary | ICD-10-CM | POA: Diagnosis not present

## 2018-12-12 DIAGNOSIS — M25562 Pain in left knee: Secondary | ICD-10-CM | POA: Diagnosis not present

## 2019-01-07 DIAGNOSIS — H25813 Combined forms of age-related cataract, bilateral: Secondary | ICD-10-CM | POA: Diagnosis not present

## 2019-01-19 DIAGNOSIS — I10 Essential (primary) hypertension: Secondary | ICD-10-CM | POA: Diagnosis not present

## 2019-01-19 DIAGNOSIS — T7840XA Allergy, unspecified, initial encounter: Secondary | ICD-10-CM | POA: Diagnosis not present

## 2019-01-19 DIAGNOSIS — L304 Erythema intertrigo: Secondary | ICD-10-CM | POA: Diagnosis not present

## 2019-01-19 DIAGNOSIS — R399 Unspecified symptoms and signs involving the genitourinary system: Secondary | ICD-10-CM | POA: Diagnosis not present

## 2019-01-19 DIAGNOSIS — Z23 Encounter for immunization: Secondary | ICD-10-CM | POA: Diagnosis not present

## 2019-01-19 DIAGNOSIS — M25562 Pain in left knee: Secondary | ICD-10-CM | POA: Diagnosis not present

## 2019-01-19 DIAGNOSIS — M25552 Pain in left hip: Secondary | ICD-10-CM | POA: Diagnosis not present

## 2019-01-19 DIAGNOSIS — J45909 Unspecified asthma, uncomplicated: Secondary | ICD-10-CM | POA: Diagnosis not present

## 2019-02-15 ENCOUNTER — Other Ambulatory Visit: Payer: Self-pay | Admitting: Internal Medicine

## 2019-05-18 ENCOUNTER — Telehealth: Payer: Self-pay | Admitting: *Deleted

## 2019-05-18 ENCOUNTER — Other Ambulatory Visit: Payer: Self-pay | Admitting: Internal Medicine

## 2019-05-18 NOTE — Telephone Encounter (Signed)
Left detailed message informing pt that we are not aware who called no phone notes are present. Pt also advised that she will be due for a CPE in July that she can call back to schedule appt.

## 2019-05-18 NOTE — Telephone Encounter (Signed)
Patient called after hours line on 03/13. Patient verbalizes she has gotten calls from the office today and yesterday. Wants to know what they are about. Caller is in [place] at this time and has not been seen in over a year. She does not have any new symptoms or upcoming appointments. No voicemail messages were left.

## 2019-05-27 ENCOUNTER — Telehealth: Payer: Self-pay

## 2019-05-27 NOTE — Telephone Encounter (Signed)
Error

## 2019-06-01 ENCOUNTER — Telehealth: Payer: Self-pay

## 2019-06-01 NOTE — Telephone Encounter (Signed)
error 

## 2019-07-03 LAB — BASIC METABOLIC PANEL
BUN: 12 (ref 4–21)
CO2: 31 — AB (ref 13–22)
Chloride: 105 (ref 99–108)
Creatinine: 0.6 (ref <=1.1)
Glucose: 78
Potassium: 4.4 (ref 3.4–5.3)
Sodium: 143 (ref 137–147)

## 2019-07-03 LAB — COMPREHENSIVE METABOLIC PANEL
Albumin: 3.9 (ref 3.5–5.0)
Calcium: 9 (ref 8.7–10.7)
GFR calc Af Amer: 94
GFR calc non Af Amer: 82
Globulin: 3.3

## 2019-07-03 LAB — HEPATIC FUNCTION PANEL
ALT: 10 (ref 7–35)
AST: 20 (ref 13–35)
Alkaline Phosphatase: 74 (ref 25–125)
Bilirubin, Total: 0.2

## 2019-07-03 LAB — CBC AND DIFFERENTIAL
HCT: 33 — AB (ref 36–46)
Hemoglobin: 9.2 — AB (ref 12.0–16.0)
Neutrophils Absolute: 6
Platelets: 499 — AB (ref 150–399)
WBC: 7.9

## 2019-07-03 LAB — CBC: RBC: 4.21 (ref 3.87–5.11)

## 2019-07-03 LAB — HEMOGLOBIN A1C: Hemoglobin A1C: 5.1

## 2019-07-03 IMAGING — CR DG HIP (WITH OR WITHOUT PELVIS) 2-3V*L*
3 series · 3 of 3 positions shown · non-contrast
Comparison: None.

CLINICAL DATA: Fall, contusion

EXAM:
DG HIP (WITH OR WITHOUT PELVIS) 2-3V LEFT

[x pelvis]
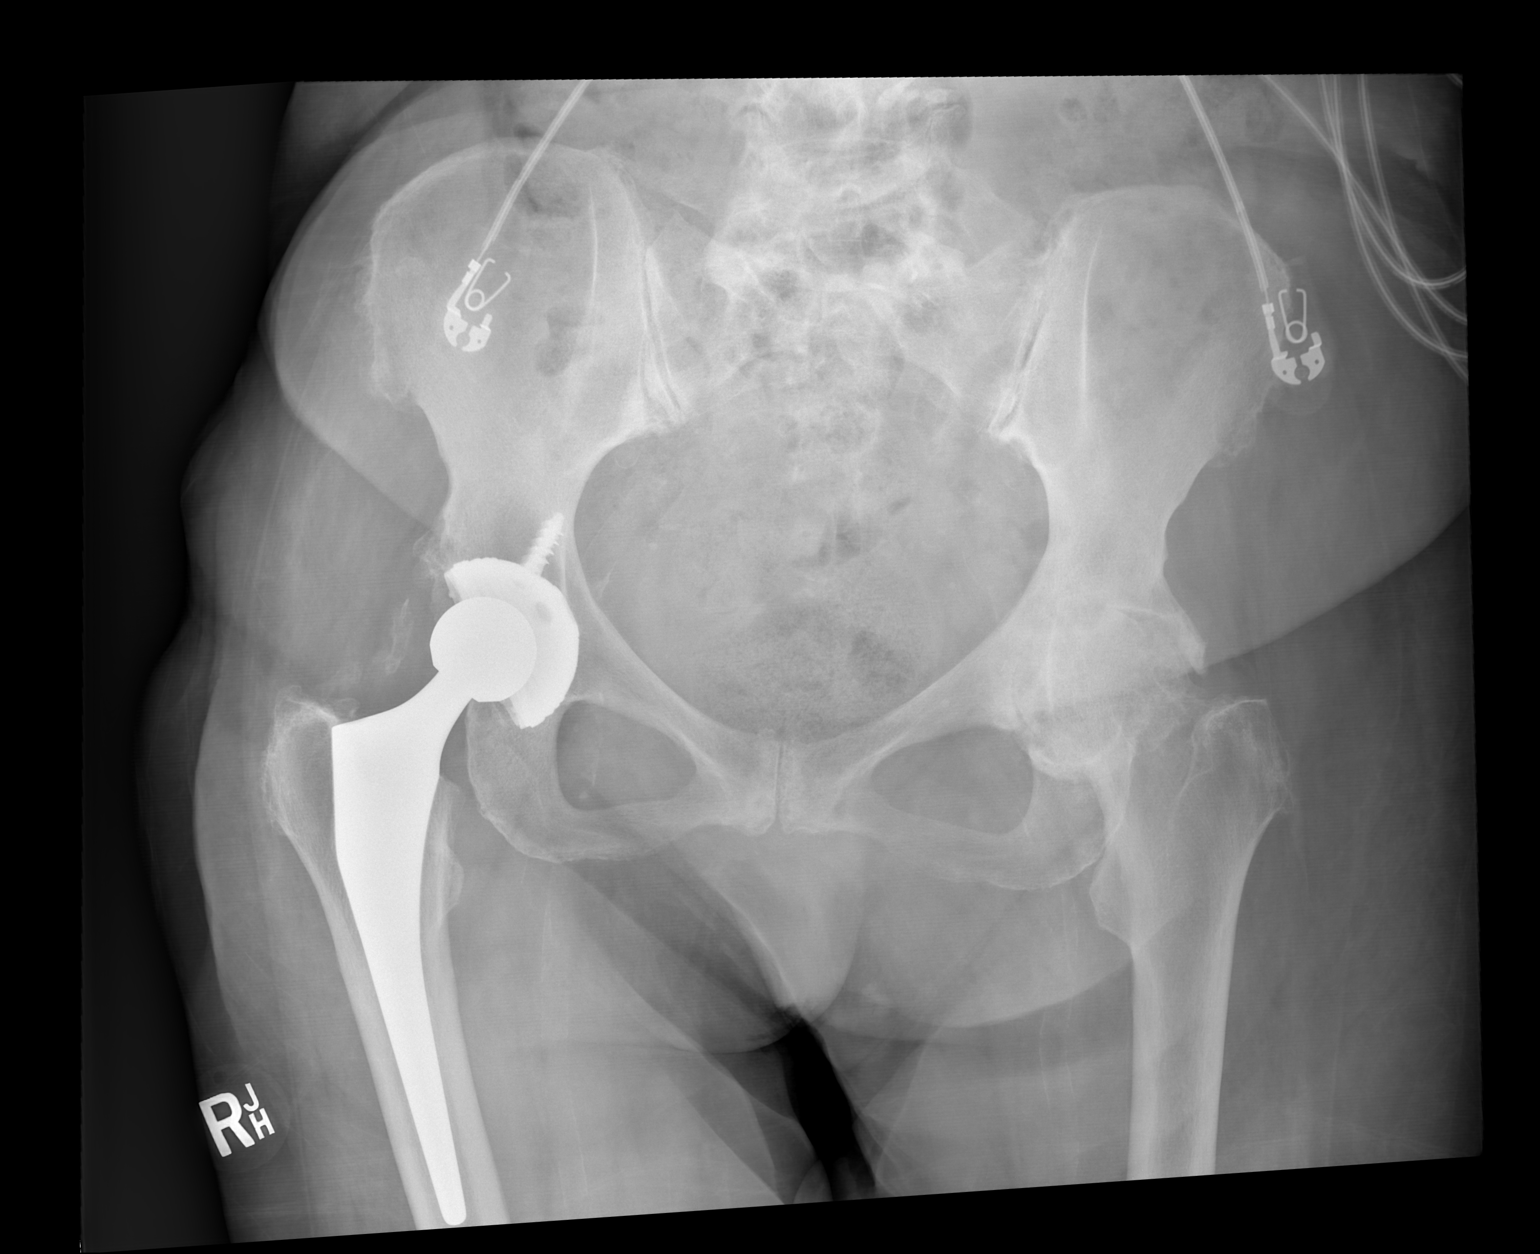

[x hip ap left]
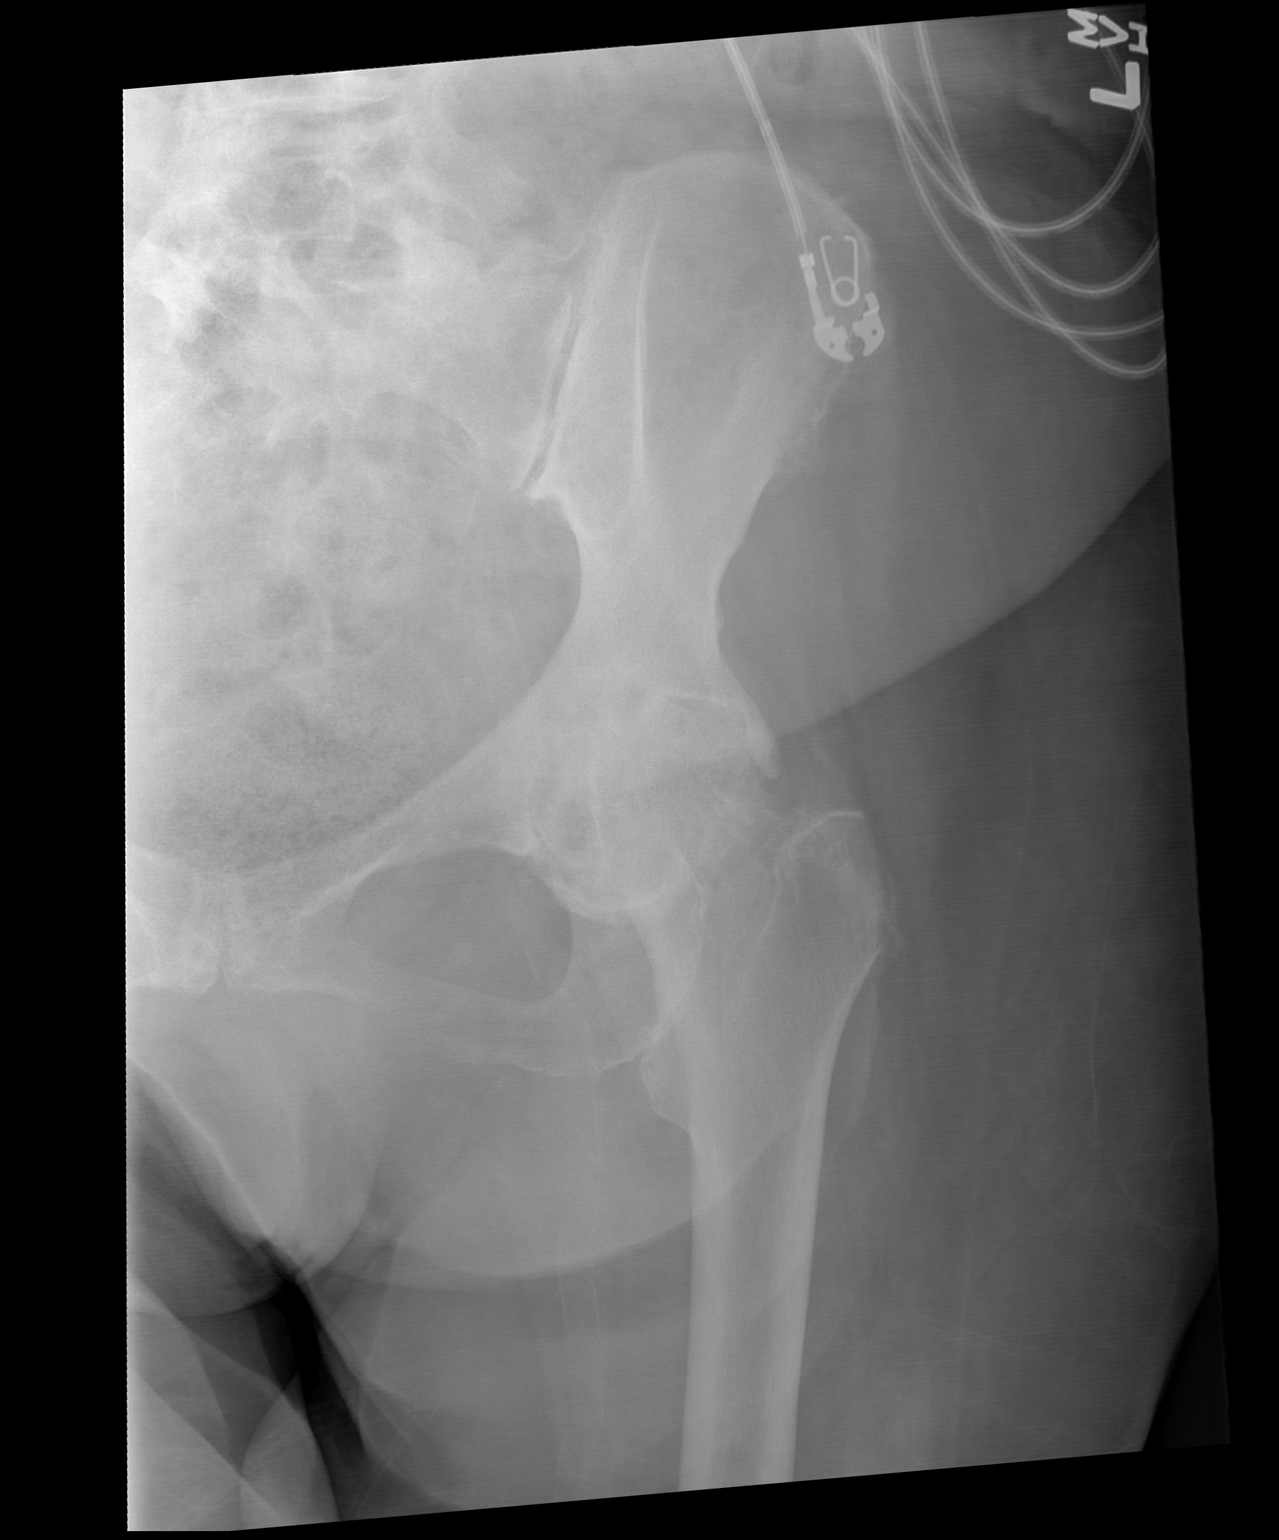

[x hip lat left]
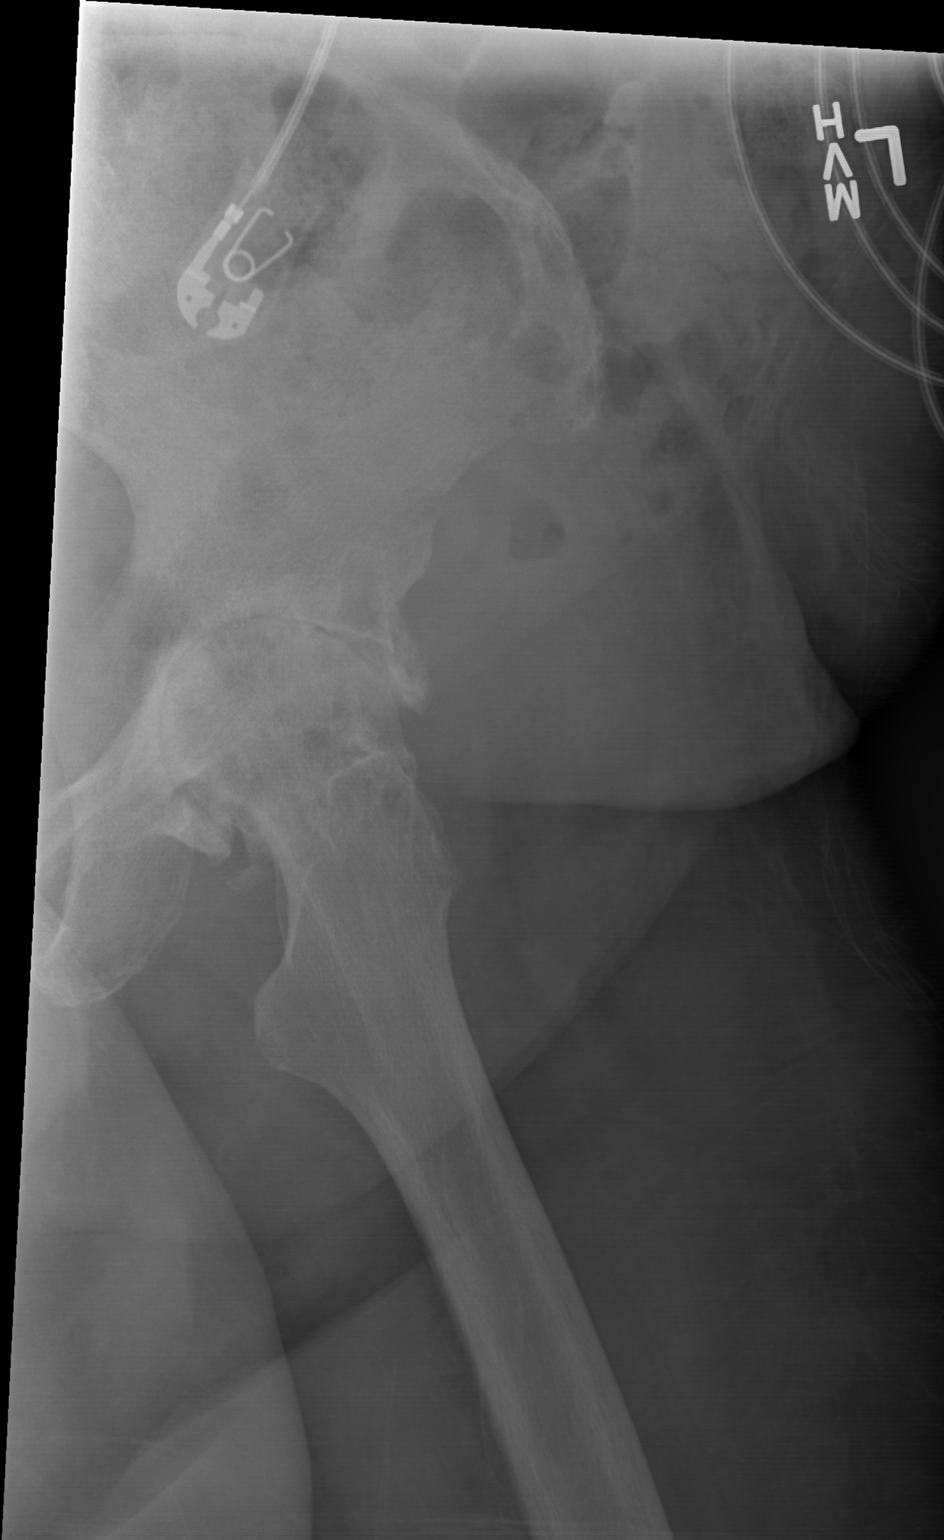

[3 of 3 positions shown; findings below may reference images not displayed]

FINDINGS: Pubic symphysis and rami are intact. Prior right hip replacement
with normal alignment.

Severe arthritis of the left hip with obliteration of joint space,
subarticular sclerosis and cyst formation. The left femoral neck is
poorly evaluated due to bony summation.
IMPRESSION: 1. Advanced arthritis of the left hip. Limited evaluation of left
femoral neck due to bony superimposition.
2. Prior right hip replacement without acute abnormality.

## 2019-07-06 ENCOUNTER — Telehealth: Payer: Self-pay | Admitting: Internal Medicine

## 2019-07-06 NOTE — Telephone Encounter (Signed)
I have not seen her since last summer   She can get flerrous sulfate OTC if needed  Has she  been on the iron the whole year?    Yes Make  Visit appt  To update her medical status and decid on best  Plan.

## 2019-07-06 NOTE — Telephone Encounter (Signed)
Pt states she needs to have surgery on her hip but they won't do so until she takes her medication ferrous sulfate  Medication Refill: Ferrous Sulfate  Pharmacy: [facility] Phone: [phone] FAX: [phone]

## 2019-07-06 NOTE — Telephone Encounter (Signed)
Last OV 09/23/2018  Last filled 08/05/2018, # 30 with 3 refills  OK to send in a refill and have her make an appointment? I have not see a surgical clearance form for this patient?

## 2019-07-07 ENCOUNTER — Other Ambulatory Visit: Payer: Self-pay

## 2019-07-07 ENCOUNTER — Encounter: Payer: Self-pay | Admitting: Internal Medicine

## 2019-07-07 ENCOUNTER — Telehealth (INDEPENDENT_AMBULATORY_CARE_PROVIDER_SITE_OTHER): Payer: Medicare Other | Admitting: Internal Medicine

## 2019-07-07 ENCOUNTER — Other Ambulatory Visit: Payer: Self-pay | Admitting: Internal Medicine

## 2019-07-07 DIAGNOSIS — Z79899 Other long term (current) drug therapy: Secondary | ICD-10-CM | POA: Diagnosis not present

## 2019-07-07 DIAGNOSIS — D509 Iron deficiency anemia, unspecified: Secondary | ICD-10-CM

## 2019-07-07 DIAGNOSIS — M25552 Pain in left hip: Secondary | ICD-10-CM | POA: Diagnosis not present

## 2019-07-07 MED ORDER — FERROUS SULFATE 325 (65 FE) MG PO TABS: ORAL_TABLET | ORAL | 1 refills | Status: AC

## 2019-07-07 NOTE — Telephone Encounter (Signed)
Patient with 2 of her daughters called back and scheduled a virtual MyChart appointment today at 2pm to discuss labs and upcoming surgery in [place]. They informed me that they will send the new lab results in a MyChart message today and would like a refill of the iron Rx with instructions after labs have been reviewed. Daughters stated that they will be changing to a provider in [place] soon. Sending as Juluis Rainier.

## 2019-07-07 NOTE — Telephone Encounter (Signed)
Called patient and LMOVM to return call  Left a detailed voice message to go over message from Dr. Regis Bill.

## 2019-07-07 NOTE — Progress Notes (Signed)
Virtual Visit via Video Note  I connected with@ on 07/07/19 at  2:00 PM EDT by a video enabled telemedicine application and verified that I am speaking with the correct person using two identifiers. Location patient: home Location provider:work  office Persons participating in the virtual visit: patient, provider and daughter   In [place] recommendations  regarding COVID 19 pandemic   video visit is advised over in office visit for this patient.  Patient aware  of the limitations of evaluation and management by telemedicine and  availability of in person appointments. and agreed to proceed.   HPI: [name] presents for video visit with  Last seen  In fall  Hs had anemia requireing iron supplementaion     Hg came back up to 12  With qod iron  Supplement   See labs     Still in [place] with daughter   And does have a new pcp up there.  Seeing ortho and consider hip surgery  Labs done showed  Hg 9.2 rest of labs basically normal  .  No bleeding   Has been off iron for the past couple months    Would like refill iron since did well before with this. ROS: See pertinent positives and negatives per HPI. No new fever has had covid vaccine series and did well   Past Medical History:  Diagnosis Date  . Allergy   . Anemia 2011   Since last visit she has had a Gi evaluation for newer onset Iron deficiiency anemia that was felt secondary to avm in duodenum rx with laser ablation.  . Arthritis    in back  . Asthma   . Cancer ([place])    rt breast  . GERD (gastroesophageal reflux disease)   . Hyperlipidemia   . Hypertension   . Lower extremity edema    ankles, feet   . Sleep apnea     Past Surgical History:  Procedure Laterality Date  . BREAST LUMPECTOMY  04/26/2010   right  . COLONOSCOPY WITH PROPOFOL N/A 09/14/2013   Procedure: COLONOSCOPY WITH PROPOFOL;  Surgeon: Inda Castle, MD;  Location: [facility];  Service: Endoscopy;  Laterality: N/A;  . HOT HEMOSTASIS N/A  09/14/2013   Procedure: HOT HEMOSTASIS (ARGON PLASMA COAGULATION/BICAP);  Surgeon: Inda Castle, MD;  Location: [facility];  Service: Endoscopy;  Laterality: N/A;  . LARYNGECTOMY  20-25 yrs ago  . PARTIAL HIP ARTHROPLASTY     rt    Family History  Problem Relation Age of Onset  . Cancer Mother        breast and ovarian    Social History   Tobacco Use  . Smoking status: Former Smoker    Packs/day: 0.25    Years: 4.00    Pack years: 1.00    Types: Cigarettes    Quit date: 03/05/1968    Years since quitting: 51.3  . Smokeless tobacco: Never Used  Substance Use Topics  . Alcohol use: Not Currently    Comment: on occ   . Drug use: No      Current Outpatient Medications:  .  amLODipine (NORVASC) 5 MG tablet, Take 1 tablet (5 mg total) by mouth daily., Disp: 90 tablet, Rfl: 3 .  budesonide-formoterol (SYMBICORT) 160-4.5 MCG/ACT inhaler, Inhale 2 puffs into the lungs 2 (two) times daily as needed (Shortness of breath). , Disp: , Rfl:  .  ferrous sulfate 325 (65 FE) MG tablet, Take 1 po bid with 1/2 glass  of orange juice Monday  Wednesday  Friday and Sunday at least 30-60  minutes before a meal ., Disp: 90 tablet, Rfl: 1 .  fexofenadine (ALLEGRA) 180 MG tablet, Take 180 mg by mouth daily as needed for allergies or rhinitis., Disp: , Rfl:  .  lisinopril-hydrochlorothiazide (ZESTORETIC) 20-12.5 MG tablet, TAKE 1 TABLET BY MOUTH TWICE DAILY, Disp: 180 tablet, Rfl: 0 .  PROAIR HFA 108 (90 Base) MCG/ACT inhaler, , Disp: , Rfl:   EXAM: BP Readings from Last 3 Encounters:  04/21/18 128/66  03/20/18 116/60  03/03/18 132/64    VITALS per patient if applicable:  GENERAL: alert, oriented, appears well and in no acute distress  HEENT: atraumatic, conjunttiva clear, no obvious abnormalities on inspection of external nose and ears  NECK: normal movements of the head and neck LUNGS: on inspection no signs of respiratory distress, breathing rate appears normal, no obvious gross SOB,  gasping or wheezing CV: no obvious cyanosis PSYCH/NEURO: pleasant and cooperative, no obvious depression or anxiety, speech and thought processing grossly intact Lab Results  Component Value Date   WBC 7.9 07/03/2019   HGB 9.2 (A) 07/03/2019   HCT 33 (A) 07/03/2019   PLT 499 (A) 07/03/2019   GLUCOSE 102 (H) 02/27/2018   CHOL 222 (H) 04/21/2018   TRIG 106.0 04/21/2018   HDL 77.40 04/21/2018   LDLCALC 124 (H) 04/21/2018   ALT 10 07/03/2019   AST 20 07/03/2019   NA 143 07/03/2019   K 4.4 07/03/2019   CL 105 07/03/2019   CREATININE 0.6 07/03/2019   BUN 12 07/03/2019   CO2 31 (A) 07/03/2019   TSH 0.47 04/21/2018   INR 1.03 02/27/2018   HGBA1C 5.1 07/03/2019   Updated  Lab  To scan also  ASSESSMENT AND PLAN:  Discussed the following assessment and plan:    ICD-10-CM   1. Iron deficiency anemia, unspecified iron deficiency anemia type  D50.9   2. Medication management  Z79.899   3. Left hip pain  M25.552     presumed djd considering surgery    Recurrent anemia without obv bleeding   In past responded to oral iron regimen  Quite well  Transition over to her PCP in [place] and wishing her well  Let us know if we can help in  Any other way. Counseled.   Expectant management and discussion of plan and treatment with opportunity to ask questions and all were answered. The patient agreed with the plan and demonstrated an understanding of the instructions.   Advised to call back or seek an in-person evaluation if worsening  or having  further concerns . No follow-ups on file. Outside external source  DATA REVIEWED:  Labs from ouside source abstracted  Independent historian: daughter  Total time on date  of service including record review ordering and plan of care:  30 minutes     Shanon Ace, MD

## 2019-07-07 NOTE — Telephone Encounter (Signed)
Patient has a virtual appointment today at 2pm. Not sure if this dosage will be correct so sending to you for approval.

## 2019-08-15 ENCOUNTER — Other Ambulatory Visit: Payer: Self-pay | Admitting: Internal Medicine

## 2019-08-30 ENCOUNTER — Other Ambulatory Visit: Payer: Self-pay | Admitting: Internal Medicine

## 2019-08-31 NOTE — Telephone Encounter (Signed)
OK to send in a 30 day refill since patient is switching to a new PCP?

## 2019-09-07 IMAGING — CR DG CHEST 2V
2 series · 2 of 2 positions shown · non-contrast
Comparison: February 28, 2011

CLINICAL DATA: Preoperative study prior to hip replacement.

EXAM:
CHEST - 2 VIEW

[w chest pa]
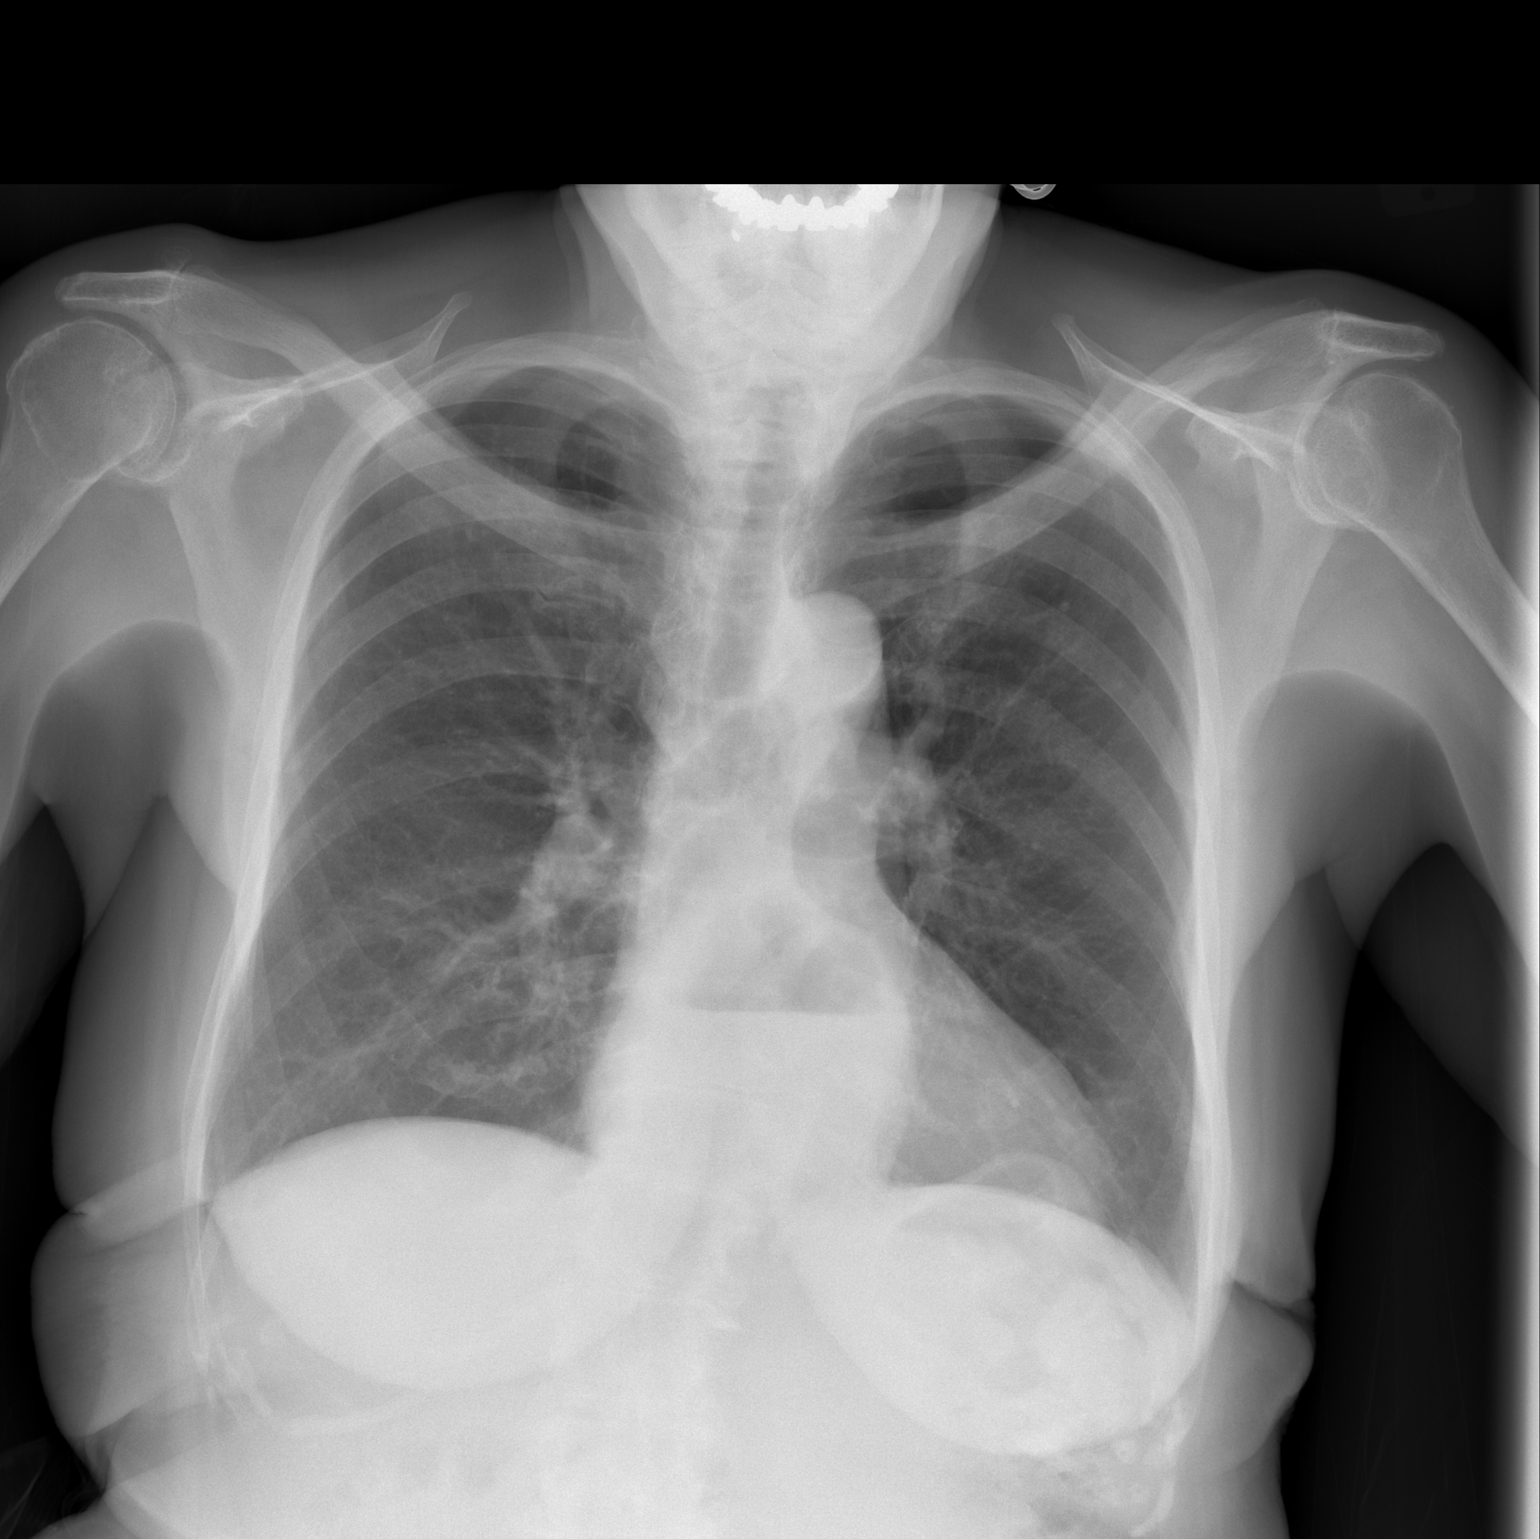

[w chest lat]
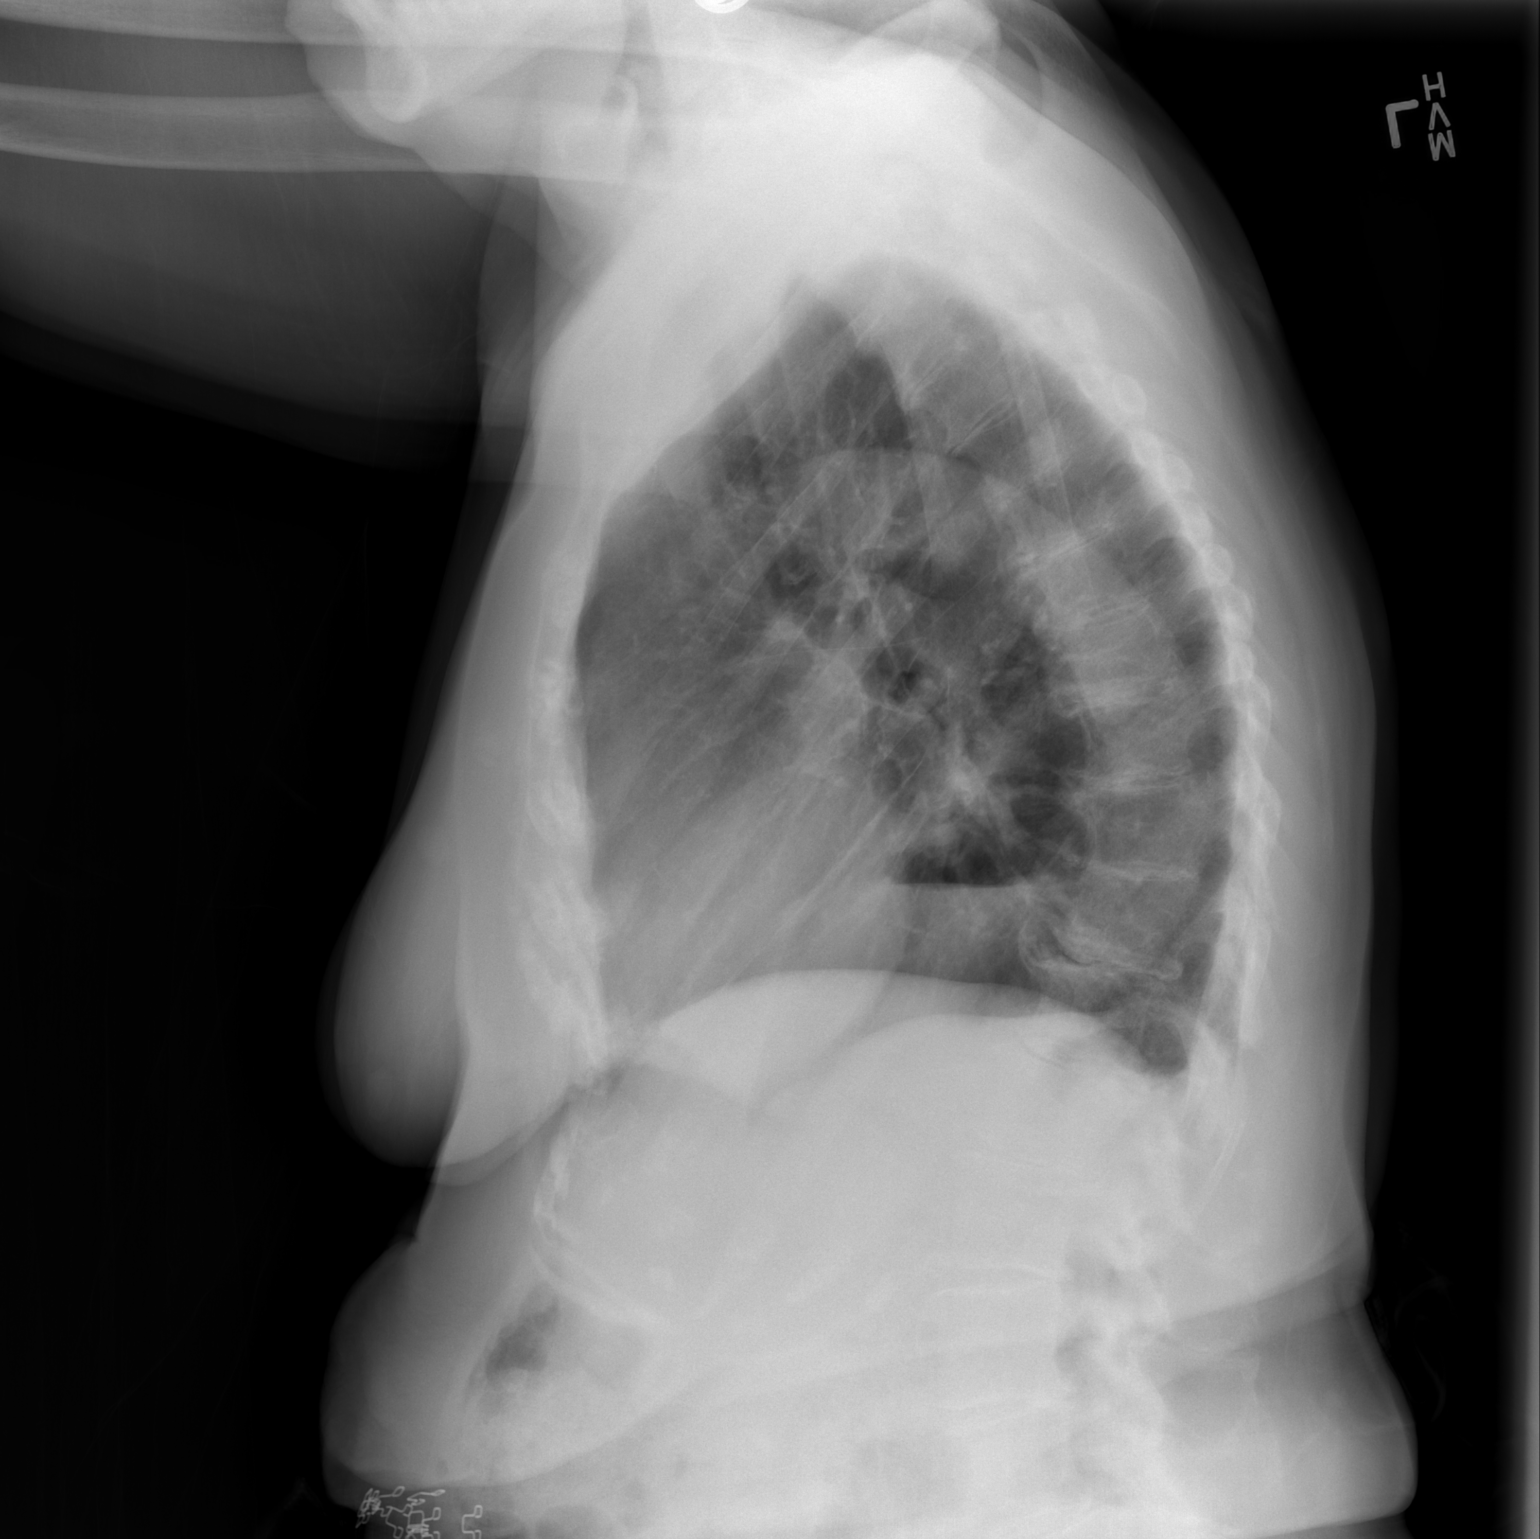

[2 of 2 positions shown; findings below may reference images not displayed]

FINDINGS: Moderate hiatal hernia. The heart, hila, and mediastinum are
unremarkable. No pneumothorax. No nodules or masses. Posterior
eventration of the left hemidiaphragm, unchanged on the lateral
view. No new nodule, mass, or suspicious infiltrate.
IMPRESSION: Moderate hiatal hernia.  No acute abnormality.

## 2019-11-19 ENCOUNTER — Other Ambulatory Visit: Payer: Self-pay | Admitting: Internal Medicine

## 2019-11-19 NOTE — Telephone Encounter (Signed)
OK to refuse since patient has a new PCP in [place]?

## 2019-11-26 NOTE — Telephone Encounter (Signed)
Yes decline since she has  moved

## 2019-11-29 ENCOUNTER — Other Ambulatory Visit: Payer: Self-pay | Admitting: Internal Medicine

## 2020-02-16 ENCOUNTER — Telehealth: Payer: Self-pay | Admitting: Internal Medicine

## 2020-02-16 NOTE — Telephone Encounter (Signed)
Left message for patient to call back and schedule Medicare Annual Wellness Visit (AWV) either virtually or in office.   Last AWV 12/26/16  please schedule at anytime with [facility] Nurse Health Advisor 1 or 2   This should be a 45 minute visit.

## 2020-03-20 ENCOUNTER — Other Ambulatory Visit: Payer: Self-pay | Admitting: Internal Medicine

## 2020-03-31 DIAGNOSIS — D509 Iron deficiency anemia, unspecified: Secondary | ICD-10-CM | POA: Diagnosis not present

## 2020-03-31 DIAGNOSIS — E876 Hypokalemia: Secondary | ICD-10-CM | POA: Diagnosis not present

## 2020-03-31 DIAGNOSIS — Z0100 Encounter for examination of eyes and vision without abnormal findings: Secondary | ICD-10-CM | POA: Diagnosis not present

## 2020-04-18 DIAGNOSIS — D509 Iron deficiency anemia, unspecified: Secondary | ICD-10-CM | POA: Diagnosis not present

## 2020-04-18 DIAGNOSIS — G3184 Mild cognitive impairment, so stated: Secondary | ICD-10-CM | POA: Diagnosis not present

## 2020-04-18 DIAGNOSIS — M545 Low back pain, unspecified: Secondary | ICD-10-CM | POA: Diagnosis not present

## 2020-04-18 DIAGNOSIS — R011 Cardiac murmur, unspecified: Secondary | ICD-10-CM | POA: Diagnosis not present

## 2020-04-18 DIAGNOSIS — D649 Anemia, unspecified: Secondary | ICD-10-CM | POA: Diagnosis not present

## 2020-06-01 DIAGNOSIS — D509 Iron deficiency anemia, unspecified: Secondary | ICD-10-CM | POA: Diagnosis not present

## 2020-06-01 DIAGNOSIS — G3184 Mild cognitive impairment, so stated: Secondary | ICD-10-CM | POA: Diagnosis not present

## 2020-06-17 ENCOUNTER — Other Ambulatory Visit: Payer: Self-pay | Admitting: Internal Medicine

## 2020-07-18 DIAGNOSIS — J479 Bronchiectasis, uncomplicated: Secondary | ICD-10-CM | POA: Diagnosis not present

## 2020-07-18 DIAGNOSIS — D509 Iron deficiency anemia, unspecified: Secondary | ICD-10-CM | POA: Diagnosis not present

## 2020-07-18 DIAGNOSIS — K449 Diaphragmatic hernia without obstruction or gangrene: Secondary | ICD-10-CM | POA: Diagnosis not present

## 2020-07-18 DIAGNOSIS — M810 Age-related osteoporosis without current pathological fracture: Secondary | ICD-10-CM | POA: Diagnosis not present

## 2020-07-18 DIAGNOSIS — J986 Disorders of diaphragm: Secondary | ICD-10-CM | POA: Diagnosis not present

## 2020-07-18 DIAGNOSIS — I700 Atherosclerosis of aorta: Secondary | ICD-10-CM | POA: Diagnosis not present

## 2020-07-18 DIAGNOSIS — I517 Cardiomegaly: Secondary | ICD-10-CM | POA: Diagnosis not present

## 2020-07-18 DIAGNOSIS — R0689 Other abnormalities of breathing: Secondary | ICD-10-CM | POA: Diagnosis not present

## 2020-07-18 DIAGNOSIS — R011 Cardiac murmur, unspecified: Secondary | ICD-10-CM | POA: Diagnosis not present

## 2020-07-18 DIAGNOSIS — G3184 Mild cognitive impairment, so stated: Secondary | ICD-10-CM | POA: Diagnosis not present

## 2020-07-18 DIAGNOSIS — R918 Other nonspecific abnormal finding of lung field: Secondary | ICD-10-CM | POA: Diagnosis not present

## 2020-07-18 DIAGNOSIS — M47815 Spondylosis without myelopathy or radiculopathy, thoracolumbar region: Secondary | ICD-10-CM | POA: Diagnosis not present

## 2020-09-17 ENCOUNTER — Other Ambulatory Visit: Payer: Self-pay | Admitting: Internal Medicine

## 2020-10-23 ENCOUNTER — Other Ambulatory Visit: Payer: Self-pay | Admitting: Internal Medicine

## 2020-11-03 ENCOUNTER — Other Ambulatory Visit: Payer: Self-pay | Admitting: Internal Medicine

## 2021-01-17 DIAGNOSIS — M47816 Spondylosis without myelopathy or radiculopathy, lumbar region: Secondary | ICD-10-CM | POA: Diagnosis not present

## 2021-01-17 DIAGNOSIS — M545 Low back pain, unspecified: Secondary | ICD-10-CM | POA: Diagnosis not present

## 2021-01-17 DIAGNOSIS — M810 Age-related osteoporosis without current pathological fracture: Secondary | ICD-10-CM | POA: Diagnosis not present

## 2021-02-02 DIAGNOSIS — M25551 Pain in right hip: Secondary | ICD-10-CM | POA: Diagnosis not present

## 2021-02-02 DIAGNOSIS — M545 Low back pain, unspecified: Secondary | ICD-10-CM | POA: Diagnosis not present

## 2021-02-02 DIAGNOSIS — M47816 Spondylosis without myelopathy or radiculopathy, lumbar region: Secondary | ICD-10-CM | POA: Diagnosis not present

## 2021-02-02 DIAGNOSIS — M25552 Pain in left hip: Secondary | ICD-10-CM | POA: Diagnosis not present

## 2021-02-02 DIAGNOSIS — M4316 Spondylolisthesis, lumbar region: Secondary | ICD-10-CM | POA: Diagnosis not present

## 2021-02-02 DIAGNOSIS — M47817 Spondylosis without myelopathy or radiculopathy, lumbosacral region: Secondary | ICD-10-CM | POA: Diagnosis not present

## 2021-03-08 DIAGNOSIS — B029 Zoster without complications: Secondary | ICD-10-CM | POA: Diagnosis not present

## 2021-04-03 DIAGNOSIS — G3184 Mild cognitive impairment, so stated: Secondary | ICD-10-CM | POA: Diagnosis not present

## 2021-04-03 DIAGNOSIS — Z7409 Other reduced mobility: Secondary | ICD-10-CM | POA: Diagnosis not present

## 2021-04-03 DIAGNOSIS — M5459 Other low back pain: Secondary | ICD-10-CM | POA: Diagnosis not present

## 2021-04-03 DIAGNOSIS — R293 Abnormal posture: Secondary | ICD-10-CM | POA: Diagnosis not present

## 2021-04-03 DIAGNOSIS — Z9181 History of falling: Secondary | ICD-10-CM | POA: Diagnosis not present

## 2021-04-03 DIAGNOSIS — M6281 Muscle weakness (generalized): Secondary | ICD-10-CM | POA: Diagnosis not present

## 2021-04-03 DIAGNOSIS — M47896 Other spondylosis, lumbar region: Secondary | ICD-10-CM | POA: Diagnosis not present

## 2021-04-10 DIAGNOSIS — Z9181 History of falling: Secondary | ICD-10-CM | POA: Diagnosis not present

## 2021-04-10 DIAGNOSIS — M47896 Other spondylosis, lumbar region: Secondary | ICD-10-CM | POA: Diagnosis not present

## 2021-04-10 DIAGNOSIS — Z7409 Other reduced mobility: Secondary | ICD-10-CM | POA: Diagnosis not present

## 2021-04-10 DIAGNOSIS — G3184 Mild cognitive impairment, so stated: Secondary | ICD-10-CM | POA: Diagnosis not present

## 2021-04-10 DIAGNOSIS — M6281 Muscle weakness (generalized): Secondary | ICD-10-CM | POA: Diagnosis not present

## 2021-04-10 DIAGNOSIS — M5459 Other low back pain: Secondary | ICD-10-CM | POA: Diagnosis not present

## 2021-04-10 DIAGNOSIS — R293 Abnormal posture: Secondary | ICD-10-CM | POA: Diagnosis not present

## 2021-04-12 DIAGNOSIS — Z7409 Other reduced mobility: Secondary | ICD-10-CM | POA: Diagnosis not present

## 2021-04-12 DIAGNOSIS — M5459 Other low back pain: Secondary | ICD-10-CM | POA: Diagnosis not present

## 2021-04-12 DIAGNOSIS — M6281 Muscle weakness (generalized): Secondary | ICD-10-CM | POA: Diagnosis not present

## 2021-04-12 DIAGNOSIS — G3184 Mild cognitive impairment, so stated: Secondary | ICD-10-CM | POA: Diagnosis not present

## 2021-04-12 DIAGNOSIS — Z9181 History of falling: Secondary | ICD-10-CM | POA: Diagnosis not present

## 2021-04-12 DIAGNOSIS — M47896 Other spondylosis, lumbar region: Secondary | ICD-10-CM | POA: Diagnosis not present

## 2021-04-12 DIAGNOSIS — R293 Abnormal posture: Secondary | ICD-10-CM | POA: Diagnosis not present

## 2021-04-17 DIAGNOSIS — M5459 Other low back pain: Secondary | ICD-10-CM | POA: Diagnosis not present

## 2021-04-17 DIAGNOSIS — Z7409 Other reduced mobility: Secondary | ICD-10-CM | POA: Diagnosis not present

## 2021-04-17 DIAGNOSIS — M6281 Muscle weakness (generalized): Secondary | ICD-10-CM | POA: Diagnosis not present

## 2021-04-17 DIAGNOSIS — M47896 Other spondylosis, lumbar region: Secondary | ICD-10-CM | POA: Diagnosis not present

## 2021-04-17 DIAGNOSIS — R293 Abnormal posture: Secondary | ICD-10-CM | POA: Diagnosis not present

## 2021-04-21 DIAGNOSIS — Z9181 History of falling: Secondary | ICD-10-CM | POA: Diagnosis not present

## 2021-04-21 DIAGNOSIS — M6281 Muscle weakness (generalized): Secondary | ICD-10-CM | POA: Diagnosis not present

## 2021-04-21 DIAGNOSIS — G3184 Mild cognitive impairment, so stated: Secondary | ICD-10-CM | POA: Diagnosis not present

## 2021-04-21 DIAGNOSIS — M5459 Other low back pain: Secondary | ICD-10-CM | POA: Diagnosis not present

## 2021-04-21 DIAGNOSIS — R293 Abnormal posture: Secondary | ICD-10-CM | POA: Diagnosis not present

## 2021-04-21 DIAGNOSIS — M47896 Other spondylosis, lumbar region: Secondary | ICD-10-CM | POA: Diagnosis not present

## 2021-04-21 DIAGNOSIS — Z7409 Other reduced mobility: Secondary | ICD-10-CM | POA: Diagnosis not present

## 2021-04-26 DIAGNOSIS — M47896 Other spondylosis, lumbar region: Secondary | ICD-10-CM | POA: Diagnosis not present

## 2021-04-26 DIAGNOSIS — Z9181 History of falling: Secondary | ICD-10-CM | POA: Diagnosis not present

## 2021-04-26 DIAGNOSIS — M6281 Muscle weakness (generalized): Secondary | ICD-10-CM | POA: Diagnosis not present

## 2021-04-26 DIAGNOSIS — Z7409 Other reduced mobility: Secondary | ICD-10-CM | POA: Diagnosis not present

## 2021-04-26 DIAGNOSIS — R293 Abnormal posture: Secondary | ICD-10-CM | POA: Diagnosis not present

## 2021-04-26 DIAGNOSIS — G3184 Mild cognitive impairment, so stated: Secondary | ICD-10-CM | POA: Diagnosis not present

## 2021-04-26 DIAGNOSIS — M5459 Other low back pain: Secondary | ICD-10-CM | POA: Diagnosis not present

## 2021-04-28 DIAGNOSIS — Z7409 Other reduced mobility: Secondary | ICD-10-CM | POA: Diagnosis not present

## 2021-04-28 DIAGNOSIS — M6281 Muscle weakness (generalized): Secondary | ICD-10-CM | POA: Diagnosis not present

## 2021-04-28 DIAGNOSIS — M5459 Other low back pain: Secondary | ICD-10-CM | POA: Diagnosis not present

## 2021-04-28 DIAGNOSIS — M47896 Other spondylosis, lumbar region: Secondary | ICD-10-CM | POA: Diagnosis not present

## 2021-04-28 DIAGNOSIS — R293 Abnormal posture: Secondary | ICD-10-CM | POA: Diagnosis not present

## 2021-05-01 DIAGNOSIS — M47896 Other spondylosis, lumbar region: Secondary | ICD-10-CM | POA: Diagnosis not present

## 2021-05-01 DIAGNOSIS — R293 Abnormal posture: Secondary | ICD-10-CM | POA: Diagnosis not present

## 2021-05-01 DIAGNOSIS — M6281 Muscle weakness (generalized): Secondary | ICD-10-CM | POA: Diagnosis not present

## 2021-05-01 DIAGNOSIS — M5459 Other low back pain: Secondary | ICD-10-CM | POA: Diagnosis not present

## 2021-05-01 DIAGNOSIS — Z7409 Other reduced mobility: Secondary | ICD-10-CM | POA: Diagnosis not present

## 2021-05-05 DIAGNOSIS — Z9181 History of falling: Secondary | ICD-10-CM | POA: Diagnosis not present

## 2021-05-05 DIAGNOSIS — M5459 Other low back pain: Secondary | ICD-10-CM | POA: Diagnosis not present

## 2021-05-05 DIAGNOSIS — G3184 Mild cognitive impairment, so stated: Secondary | ICD-10-CM | POA: Diagnosis not present

## 2021-05-05 DIAGNOSIS — Z7409 Other reduced mobility: Secondary | ICD-10-CM | POA: Diagnosis not present

## 2021-05-05 DIAGNOSIS — R293 Abnormal posture: Secondary | ICD-10-CM | POA: Diagnosis not present

## 2021-05-05 DIAGNOSIS — M47896 Other spondylosis, lumbar region: Secondary | ICD-10-CM | POA: Diagnosis not present

## 2021-05-05 DIAGNOSIS — M6281 Muscle weakness (generalized): Secondary | ICD-10-CM | POA: Diagnosis not present

## 2021-05-08 DIAGNOSIS — Z7409 Other reduced mobility: Secondary | ICD-10-CM | POA: Diagnosis not present

## 2021-05-08 DIAGNOSIS — R293 Abnormal posture: Secondary | ICD-10-CM | POA: Diagnosis not present

## 2021-05-08 DIAGNOSIS — Z9181 History of falling: Secondary | ICD-10-CM | POA: Diagnosis not present

## 2021-05-08 DIAGNOSIS — M5459 Other low back pain: Secondary | ICD-10-CM | POA: Diagnosis not present

## 2021-05-08 DIAGNOSIS — G3184 Mild cognitive impairment, so stated: Secondary | ICD-10-CM | POA: Diagnosis not present

## 2021-05-08 DIAGNOSIS — M47896 Other spondylosis, lumbar region: Secondary | ICD-10-CM | POA: Diagnosis not present

## 2021-05-08 DIAGNOSIS — M6281 Muscle weakness (generalized): Secondary | ICD-10-CM | POA: Diagnosis not present

## 2021-05-11 DIAGNOSIS — M549 Dorsalgia, unspecified: Secondary | ICD-10-CM | POA: Diagnosis not present

## 2021-05-12 DIAGNOSIS — Z7409 Other reduced mobility: Secondary | ICD-10-CM | POA: Diagnosis not present

## 2021-05-12 DIAGNOSIS — M5459 Other low back pain: Secondary | ICD-10-CM | POA: Diagnosis not present

## 2021-05-12 DIAGNOSIS — R293 Abnormal posture: Secondary | ICD-10-CM | POA: Diagnosis not present

## 2021-05-12 DIAGNOSIS — Z9181 History of falling: Secondary | ICD-10-CM | POA: Diagnosis not present

## 2021-05-12 DIAGNOSIS — G3184 Mild cognitive impairment, so stated: Secondary | ICD-10-CM | POA: Diagnosis not present

## 2021-05-12 DIAGNOSIS — M6281 Muscle weakness (generalized): Secondary | ICD-10-CM | POA: Diagnosis not present

## 2021-05-12 DIAGNOSIS — M47896 Other spondylosis, lumbar region: Secondary | ICD-10-CM | POA: Diagnosis not present

## 2021-05-22 DIAGNOSIS — I5032 Chronic diastolic (congestive) heart failure: Secondary | ICD-10-CM | POA: Diagnosis not present

## 2021-05-22 DIAGNOSIS — G3184 Mild cognitive impairment, so stated: Secondary | ICD-10-CM | POA: Diagnosis not present

## 2021-05-22 DIAGNOSIS — Z0000 Encounter for general adult medical examination without abnormal findings: Secondary | ICD-10-CM | POA: Diagnosis not present

## 2021-05-22 DIAGNOSIS — L304 Erythema intertrigo: Secondary | ICD-10-CM | POA: Diagnosis not present

## 2021-05-22 DIAGNOSIS — Z8619 Personal history of other infectious and parasitic diseases: Secondary | ICD-10-CM | POA: Diagnosis not present

## 2021-05-22 DIAGNOSIS — D649 Anemia, unspecified: Secondary | ICD-10-CM | POA: Diagnosis not present

## 2021-05-22 DIAGNOSIS — I10 Essential (primary) hypertension: Secondary | ICD-10-CM | POA: Diagnosis not present

## 2021-05-22 DIAGNOSIS — R7989 Other specified abnormal findings of blood chemistry: Secondary | ICD-10-CM | POA: Diagnosis not present

## 2021-05-26 DIAGNOSIS — Z9181 History of falling: Secondary | ICD-10-CM | POA: Diagnosis not present

## 2021-05-26 DIAGNOSIS — G3184 Mild cognitive impairment, so stated: Secondary | ICD-10-CM | POA: Diagnosis not present

## 2021-05-26 DIAGNOSIS — M6281 Muscle weakness (generalized): Secondary | ICD-10-CM | POA: Diagnosis not present

## 2021-05-26 DIAGNOSIS — M5459 Other low back pain: Secondary | ICD-10-CM | POA: Diagnosis not present

## 2021-05-26 DIAGNOSIS — Z7409 Other reduced mobility: Secondary | ICD-10-CM | POA: Diagnosis not present

## 2021-05-26 DIAGNOSIS — R293 Abnormal posture: Secondary | ICD-10-CM | POA: Diagnosis not present

## 2021-05-26 DIAGNOSIS — M47896 Other spondylosis, lumbar region: Secondary | ICD-10-CM | POA: Diagnosis not present

## 2021-05-31 DIAGNOSIS — M6281 Muscle weakness (generalized): Secondary | ICD-10-CM | POA: Diagnosis not present

## 2021-05-31 DIAGNOSIS — G3184 Mild cognitive impairment, so stated: Secondary | ICD-10-CM | POA: Diagnosis not present

## 2021-05-31 DIAGNOSIS — M5459 Other low back pain: Secondary | ICD-10-CM | POA: Diagnosis not present

## 2021-05-31 DIAGNOSIS — Z9181 History of falling: Secondary | ICD-10-CM | POA: Diagnosis not present

## 2021-05-31 DIAGNOSIS — Z7409 Other reduced mobility: Secondary | ICD-10-CM | POA: Diagnosis not present

## 2021-05-31 DIAGNOSIS — R293 Abnormal posture: Secondary | ICD-10-CM | POA: Diagnosis not present

## 2021-05-31 DIAGNOSIS — M47896 Other spondylosis, lumbar region: Secondary | ICD-10-CM | POA: Diagnosis not present

## 2021-06-07 DIAGNOSIS — Z9181 History of falling: Secondary | ICD-10-CM | POA: Diagnosis not present

## 2021-06-07 DIAGNOSIS — M47896 Other spondylosis, lumbar region: Secondary | ICD-10-CM | POA: Diagnosis not present

## 2021-06-07 DIAGNOSIS — M6281 Muscle weakness (generalized): Secondary | ICD-10-CM | POA: Diagnosis not present

## 2021-06-07 DIAGNOSIS — R293 Abnormal posture: Secondary | ICD-10-CM | POA: Diagnosis not present

## 2021-06-07 DIAGNOSIS — M5459 Other low back pain: Secondary | ICD-10-CM | POA: Diagnosis not present

## 2021-06-07 DIAGNOSIS — Z7409 Other reduced mobility: Secondary | ICD-10-CM | POA: Diagnosis not present

## 2021-06-07 DIAGNOSIS — G3184 Mild cognitive impairment, so stated: Secondary | ICD-10-CM | POA: Diagnosis not present

## 2021-06-12 DIAGNOSIS — M5459 Other low back pain: Secondary | ICD-10-CM | POA: Diagnosis not present

## 2021-06-12 DIAGNOSIS — M6281 Muscle weakness (generalized): Secondary | ICD-10-CM | POA: Diagnosis not present

## 2021-06-12 DIAGNOSIS — M47896 Other spondylosis, lumbar region: Secondary | ICD-10-CM | POA: Diagnosis not present

## 2021-06-12 DIAGNOSIS — R293 Abnormal posture: Secondary | ICD-10-CM | POA: Diagnosis not present

## 2021-06-12 DIAGNOSIS — G3184 Mild cognitive impairment, so stated: Secondary | ICD-10-CM | POA: Diagnosis not present

## 2021-06-12 DIAGNOSIS — Z9181 History of falling: Secondary | ICD-10-CM | POA: Diagnosis not present

## 2021-06-12 DIAGNOSIS — Z7409 Other reduced mobility: Secondary | ICD-10-CM | POA: Diagnosis not present

## 2021-06-14 DIAGNOSIS — Z7409 Other reduced mobility: Secondary | ICD-10-CM | POA: Diagnosis not present

## 2021-06-14 DIAGNOSIS — M47896 Other spondylosis, lumbar region: Secondary | ICD-10-CM | POA: Diagnosis not present

## 2021-06-14 DIAGNOSIS — M5459 Other low back pain: Secondary | ICD-10-CM | POA: Diagnosis not present

## 2021-06-14 DIAGNOSIS — M6281 Muscle weakness (generalized): Secondary | ICD-10-CM | POA: Diagnosis not present

## 2021-06-14 DIAGNOSIS — R293 Abnormal posture: Secondary | ICD-10-CM | POA: Diagnosis not present

## 2021-06-16 DIAGNOSIS — M6281 Muscle weakness (generalized): Secondary | ICD-10-CM | POA: Diagnosis not present

## 2021-06-16 DIAGNOSIS — R293 Abnormal posture: Secondary | ICD-10-CM | POA: Diagnosis not present

## 2021-06-16 DIAGNOSIS — M5459 Other low back pain: Secondary | ICD-10-CM | POA: Diagnosis not present

## 2021-06-16 DIAGNOSIS — M47896 Other spondylosis, lumbar region: Secondary | ICD-10-CM | POA: Diagnosis not present

## 2021-06-16 DIAGNOSIS — Z7409 Other reduced mobility: Secondary | ICD-10-CM | POA: Diagnosis not present

## 2021-06-21 DIAGNOSIS — Z7409 Other reduced mobility: Secondary | ICD-10-CM | POA: Diagnosis not present

## 2021-06-21 DIAGNOSIS — Z9181 History of falling: Secondary | ICD-10-CM | POA: Diagnosis not present

## 2021-06-21 DIAGNOSIS — R293 Abnormal posture: Secondary | ICD-10-CM | POA: Diagnosis not present

## 2021-06-21 DIAGNOSIS — M6281 Muscle weakness (generalized): Secondary | ICD-10-CM | POA: Diagnosis not present

## 2021-06-21 DIAGNOSIS — G3184 Mild cognitive impairment, so stated: Secondary | ICD-10-CM | POA: Diagnosis not present

## 2021-06-21 DIAGNOSIS — M47896 Other spondylosis, lumbar region: Secondary | ICD-10-CM | POA: Diagnosis not present

## 2021-06-21 DIAGNOSIS — M5459 Other low back pain: Secondary | ICD-10-CM | POA: Diagnosis not present

## 2021-06-23 DIAGNOSIS — R293 Abnormal posture: Secondary | ICD-10-CM | POA: Diagnosis not present

## 2021-06-23 DIAGNOSIS — M6281 Muscle weakness (generalized): Secondary | ICD-10-CM | POA: Diagnosis not present

## 2021-06-23 DIAGNOSIS — M47896 Other spondylosis, lumbar region: Secondary | ICD-10-CM | POA: Diagnosis not present

## 2021-06-23 DIAGNOSIS — Z7409 Other reduced mobility: Secondary | ICD-10-CM | POA: Diagnosis not present

## 2021-06-23 DIAGNOSIS — M5459 Other low back pain: Secondary | ICD-10-CM | POA: Diagnosis not present

## 2021-06-26 DIAGNOSIS — G3184 Mild cognitive impairment, so stated: Secondary | ICD-10-CM | POA: Diagnosis not present

## 2021-06-26 DIAGNOSIS — M6281 Muscle weakness (generalized): Secondary | ICD-10-CM | POA: Diagnosis not present

## 2021-06-26 DIAGNOSIS — Z9181 History of falling: Secondary | ICD-10-CM | POA: Diagnosis not present

## 2021-06-26 DIAGNOSIS — M5459 Other low back pain: Secondary | ICD-10-CM | POA: Diagnosis not present

## 2021-06-26 DIAGNOSIS — Z7409 Other reduced mobility: Secondary | ICD-10-CM | POA: Diagnosis not present

## 2021-06-26 DIAGNOSIS — M47896 Other spondylosis, lumbar region: Secondary | ICD-10-CM | POA: Diagnosis not present

## 2021-06-26 DIAGNOSIS — R293 Abnormal posture: Secondary | ICD-10-CM | POA: Diagnosis not present

## 2021-06-28 DIAGNOSIS — M5459 Other low back pain: Secondary | ICD-10-CM | POA: Diagnosis not present

## 2021-06-28 DIAGNOSIS — M6281 Muscle weakness (generalized): Secondary | ICD-10-CM | POA: Diagnosis not present

## 2021-06-28 DIAGNOSIS — Z7409 Other reduced mobility: Secondary | ICD-10-CM | POA: Diagnosis not present

## 2021-06-28 DIAGNOSIS — R293 Abnormal posture: Secondary | ICD-10-CM | POA: Diagnosis not present

## 2021-06-28 DIAGNOSIS — M47896 Other spondylosis, lumbar region: Secondary | ICD-10-CM | POA: Diagnosis not present

## 2021-06-30 DIAGNOSIS — M47896 Other spondylosis, lumbar region: Secondary | ICD-10-CM | POA: Diagnosis not present

## 2021-06-30 DIAGNOSIS — Z7409 Other reduced mobility: Secondary | ICD-10-CM | POA: Diagnosis not present

## 2021-06-30 DIAGNOSIS — M5459 Other low back pain: Secondary | ICD-10-CM | POA: Diagnosis not present

## 2021-06-30 DIAGNOSIS — M6281 Muscle weakness (generalized): Secondary | ICD-10-CM | POA: Diagnosis not present

## 2021-06-30 DIAGNOSIS — R293 Abnormal posture: Secondary | ICD-10-CM | POA: Diagnosis not present

## 2021-07-05 DIAGNOSIS — M6281 Muscle weakness (generalized): Secondary | ICD-10-CM | POA: Diagnosis not present

## 2021-07-05 DIAGNOSIS — G3184 Mild cognitive impairment, so stated: Secondary | ICD-10-CM | POA: Diagnosis not present

## 2021-07-05 DIAGNOSIS — Z7409 Other reduced mobility: Secondary | ICD-10-CM | POA: Diagnosis not present

## 2021-07-05 DIAGNOSIS — M47896 Other spondylosis, lumbar region: Secondary | ICD-10-CM | POA: Diagnosis not present

## 2021-07-05 DIAGNOSIS — Z9181 History of falling: Secondary | ICD-10-CM | POA: Diagnosis not present

## 2021-07-05 DIAGNOSIS — M5459 Other low back pain: Secondary | ICD-10-CM | POA: Diagnosis not present

## 2021-07-05 DIAGNOSIS — R293 Abnormal posture: Secondary | ICD-10-CM | POA: Diagnosis not present

## 2021-07-07 DIAGNOSIS — Z7409 Other reduced mobility: Secondary | ICD-10-CM | POA: Diagnosis not present

## 2021-07-07 DIAGNOSIS — R293 Abnormal posture: Secondary | ICD-10-CM | POA: Diagnosis not present

## 2021-07-07 DIAGNOSIS — M47896 Other spondylosis, lumbar region: Secondary | ICD-10-CM | POA: Diagnosis not present

## 2021-07-07 DIAGNOSIS — M6281 Muscle weakness (generalized): Secondary | ICD-10-CM | POA: Diagnosis not present

## 2021-07-07 DIAGNOSIS — M5459 Other low back pain: Secondary | ICD-10-CM | POA: Diagnosis not present

## 2021-07-10 DIAGNOSIS — M5459 Other low back pain: Secondary | ICD-10-CM | POA: Diagnosis not present

## 2021-07-10 DIAGNOSIS — M6281 Muscle weakness (generalized): Secondary | ICD-10-CM | POA: Diagnosis not present

## 2021-07-10 DIAGNOSIS — R293 Abnormal posture: Secondary | ICD-10-CM | POA: Diagnosis not present

## 2021-07-10 DIAGNOSIS — Z7409 Other reduced mobility: Secondary | ICD-10-CM | POA: Diagnosis not present

## 2021-07-10 DIAGNOSIS — M47896 Other spondylosis, lumbar region: Secondary | ICD-10-CM | POA: Diagnosis not present

## 2021-07-10 DIAGNOSIS — G3184 Mild cognitive impairment, so stated: Secondary | ICD-10-CM | POA: Diagnosis not present

## 2021-07-10 DIAGNOSIS — Z9181 History of falling: Secondary | ICD-10-CM | POA: Diagnosis not present

## 2021-07-14 DIAGNOSIS — M5459 Other low back pain: Secondary | ICD-10-CM | POA: Diagnosis not present

## 2021-07-14 DIAGNOSIS — M47896 Other spondylosis, lumbar region: Secondary | ICD-10-CM | POA: Diagnosis not present

## 2021-07-14 DIAGNOSIS — R293 Abnormal posture: Secondary | ICD-10-CM | POA: Diagnosis not present

## 2021-07-14 DIAGNOSIS — Z7409 Other reduced mobility: Secondary | ICD-10-CM | POA: Diagnosis not present

## 2021-07-14 DIAGNOSIS — M6281 Muscle weakness (generalized): Secondary | ICD-10-CM | POA: Diagnosis not present

## 2021-07-17 DIAGNOSIS — M47896 Other spondylosis, lumbar region: Secondary | ICD-10-CM | POA: Diagnosis not present

## 2021-07-17 DIAGNOSIS — Z9181 History of falling: Secondary | ICD-10-CM | POA: Diagnosis not present

## 2021-07-17 DIAGNOSIS — R293 Abnormal posture: Secondary | ICD-10-CM | POA: Diagnosis not present

## 2021-07-17 DIAGNOSIS — G3184 Mild cognitive impairment, so stated: Secondary | ICD-10-CM | POA: Diagnosis not present

## 2021-07-17 DIAGNOSIS — M5459 Other low back pain: Secondary | ICD-10-CM | POA: Diagnosis not present

## 2021-07-17 DIAGNOSIS — M6281 Muscle weakness (generalized): Secondary | ICD-10-CM | POA: Diagnosis not present

## 2021-07-17 DIAGNOSIS — Z7409 Other reduced mobility: Secondary | ICD-10-CM | POA: Diagnosis not present

## 2021-07-19 DIAGNOSIS — R293 Abnormal posture: Secondary | ICD-10-CM | POA: Diagnosis not present

## 2021-07-19 DIAGNOSIS — M47896 Other spondylosis, lumbar region: Secondary | ICD-10-CM | POA: Diagnosis not present

## 2021-07-19 DIAGNOSIS — Z7409 Other reduced mobility: Secondary | ICD-10-CM | POA: Diagnosis not present

## 2021-07-19 DIAGNOSIS — M5459 Other low back pain: Secondary | ICD-10-CM | POA: Diagnosis not present

## 2021-07-19 DIAGNOSIS — M6281 Muscle weakness (generalized): Secondary | ICD-10-CM | POA: Diagnosis not present

## 2021-07-21 DIAGNOSIS — R293 Abnormal posture: Secondary | ICD-10-CM | POA: Diagnosis not present

## 2021-07-21 DIAGNOSIS — M5459 Other low back pain: Secondary | ICD-10-CM | POA: Diagnosis not present

## 2021-07-21 DIAGNOSIS — M47896 Other spondylosis, lumbar region: Secondary | ICD-10-CM | POA: Diagnosis not present

## 2021-07-21 DIAGNOSIS — Z7409 Other reduced mobility: Secondary | ICD-10-CM | POA: Diagnosis not present

## 2021-07-21 DIAGNOSIS — M6281 Muscle weakness (generalized): Secondary | ICD-10-CM | POA: Diagnosis not present

## 2021-07-24 DIAGNOSIS — M47896 Other spondylosis, lumbar region: Secondary | ICD-10-CM | POA: Diagnosis not present

## 2021-07-24 DIAGNOSIS — Z9181 History of falling: Secondary | ICD-10-CM | POA: Diagnosis not present

## 2021-07-24 DIAGNOSIS — R293 Abnormal posture: Secondary | ICD-10-CM | POA: Diagnosis not present

## 2021-07-24 DIAGNOSIS — G3184 Mild cognitive impairment, so stated: Secondary | ICD-10-CM | POA: Diagnosis not present

## 2021-07-24 DIAGNOSIS — Z7409 Other reduced mobility: Secondary | ICD-10-CM | POA: Diagnosis not present

## 2021-07-24 DIAGNOSIS — M5459 Other low back pain: Secondary | ICD-10-CM | POA: Diagnosis not present

## 2021-07-24 DIAGNOSIS — M6281 Muscle weakness (generalized): Secondary | ICD-10-CM | POA: Diagnosis not present

## 2021-07-26 DIAGNOSIS — M5459 Other low back pain: Secondary | ICD-10-CM | POA: Diagnosis not present

## 2021-07-26 DIAGNOSIS — M6281 Muscle weakness (generalized): Secondary | ICD-10-CM | POA: Diagnosis not present

## 2021-07-26 DIAGNOSIS — R293 Abnormal posture: Secondary | ICD-10-CM | POA: Diagnosis not present

## 2021-07-26 DIAGNOSIS — M47896 Other spondylosis, lumbar region: Secondary | ICD-10-CM | POA: Diagnosis not present

## 2021-07-26 DIAGNOSIS — Z7409 Other reduced mobility: Secondary | ICD-10-CM | POA: Diagnosis not present

## 2021-07-28 DIAGNOSIS — M6281 Muscle weakness (generalized): Secondary | ICD-10-CM | POA: Diagnosis not present

## 2021-07-28 DIAGNOSIS — M47896 Other spondylosis, lumbar region: Secondary | ICD-10-CM | POA: Diagnosis not present

## 2021-07-28 DIAGNOSIS — R293 Abnormal posture: Secondary | ICD-10-CM | POA: Diagnosis not present

## 2021-07-28 DIAGNOSIS — M5459 Other low back pain: Secondary | ICD-10-CM | POA: Diagnosis not present

## 2021-07-28 DIAGNOSIS — Z7409 Other reduced mobility: Secondary | ICD-10-CM | POA: Diagnosis not present

## 2021-08-02 DIAGNOSIS — R293 Abnormal posture: Secondary | ICD-10-CM | POA: Diagnosis not present

## 2021-08-02 DIAGNOSIS — G3184 Mild cognitive impairment, so stated: Secondary | ICD-10-CM | POA: Diagnosis not present

## 2021-08-02 DIAGNOSIS — M5459 Other low back pain: Secondary | ICD-10-CM | POA: Diagnosis not present

## 2021-08-02 DIAGNOSIS — M47896 Other spondylosis, lumbar region: Secondary | ICD-10-CM | POA: Diagnosis not present

## 2021-08-02 DIAGNOSIS — Z7409 Other reduced mobility: Secondary | ICD-10-CM | POA: Diagnosis not present

## 2021-08-02 DIAGNOSIS — M6281 Muscle weakness (generalized): Secondary | ICD-10-CM | POA: Diagnosis not present

## 2021-08-02 DIAGNOSIS — Z9181 History of falling: Secondary | ICD-10-CM | POA: Diagnosis not present

## 2021-08-03 DIAGNOSIS — M5459 Other low back pain: Secondary | ICD-10-CM | POA: Diagnosis not present

## 2021-08-03 DIAGNOSIS — M5136 Other intervertebral disc degeneration, lumbar region: Secondary | ICD-10-CM | POA: Diagnosis not present

## 2021-08-03 DIAGNOSIS — J302 Other seasonal allergic rhinitis: Secondary | ICD-10-CM | POA: Diagnosis not present

## 2021-08-03 DIAGNOSIS — R600 Localized edema: Secondary | ICD-10-CM | POA: Diagnosis not present

## 2021-08-03 DIAGNOSIS — J4521 Mild intermittent asthma with (acute) exacerbation: Secondary | ICD-10-CM | POA: Diagnosis not present

## 2021-08-04 DIAGNOSIS — R293 Abnormal posture: Secondary | ICD-10-CM | POA: Diagnosis not present

## 2021-08-04 DIAGNOSIS — M5459 Other low back pain: Secondary | ICD-10-CM | POA: Diagnosis not present

## 2021-08-04 DIAGNOSIS — M47896 Other spondylosis, lumbar region: Secondary | ICD-10-CM | POA: Diagnosis not present

## 2021-08-04 DIAGNOSIS — M6281 Muscle weakness (generalized): Secondary | ICD-10-CM | POA: Diagnosis not present

## 2021-08-04 DIAGNOSIS — Z7409 Other reduced mobility: Secondary | ICD-10-CM | POA: Diagnosis not present

## 2021-08-04 DIAGNOSIS — G3184 Mild cognitive impairment, so stated: Secondary | ICD-10-CM | POA: Diagnosis not present

## 2021-08-04 DIAGNOSIS — Z9181 History of falling: Secondary | ICD-10-CM | POA: Diagnosis not present

## 2021-08-11 DIAGNOSIS — M48061 Spinal stenosis, lumbar region without neurogenic claudication: Secondary | ICD-10-CM | POA: Diagnosis not present

## 2021-08-11 DIAGNOSIS — M5416 Radiculopathy, lumbar region: Secondary | ICD-10-CM | POA: Diagnosis not present

## 2021-08-16 DIAGNOSIS — M5459 Other low back pain: Secondary | ICD-10-CM | POA: Diagnosis not present

## 2021-08-16 DIAGNOSIS — Z7409 Other reduced mobility: Secondary | ICD-10-CM | POA: Diagnosis not present

## 2021-08-16 DIAGNOSIS — G3184 Mild cognitive impairment, so stated: Secondary | ICD-10-CM | POA: Diagnosis not present

## 2021-08-16 DIAGNOSIS — R293 Abnormal posture: Secondary | ICD-10-CM | POA: Diagnosis not present

## 2021-08-16 DIAGNOSIS — M47896 Other spondylosis, lumbar region: Secondary | ICD-10-CM | POA: Diagnosis not present

## 2021-08-16 DIAGNOSIS — M6281 Muscle weakness (generalized): Secondary | ICD-10-CM | POA: Diagnosis not present

## 2021-08-16 DIAGNOSIS — Z9181 History of falling: Secondary | ICD-10-CM | POA: Diagnosis not present

## 2021-08-18 DIAGNOSIS — R293 Abnormal posture: Secondary | ICD-10-CM | POA: Diagnosis not present

## 2021-08-18 DIAGNOSIS — Z9181 History of falling: Secondary | ICD-10-CM | POA: Diagnosis not present

## 2021-08-18 DIAGNOSIS — Z7409 Other reduced mobility: Secondary | ICD-10-CM | POA: Diagnosis not present

## 2021-08-18 DIAGNOSIS — G3184 Mild cognitive impairment, so stated: Secondary | ICD-10-CM | POA: Diagnosis not present

## 2021-08-18 DIAGNOSIS — M47896 Other spondylosis, lumbar region: Secondary | ICD-10-CM | POA: Diagnosis not present

## 2021-08-18 DIAGNOSIS — M6281 Muscle weakness (generalized): Secondary | ICD-10-CM | POA: Diagnosis not present

## 2021-08-18 DIAGNOSIS — M5459 Other low back pain: Secondary | ICD-10-CM | POA: Diagnosis not present

## 2021-09-13 DIAGNOSIS — D75839 Thrombocytosis, unspecified: Secondary | ICD-10-CM | POA: Diagnosis not present

## 2021-09-13 DIAGNOSIS — D649 Anemia, unspecified: Secondary | ICD-10-CM | POA: Diagnosis not present

## 2021-09-13 DIAGNOSIS — R55 Syncope and collapse: Secondary | ICD-10-CM | POA: Diagnosis not present

## 2021-09-13 DIAGNOSIS — Z743 Need for continuous supervision: Secondary | ICD-10-CM | POA: Diagnosis not present

## 2021-09-13 DIAGNOSIS — I10 Essential (primary) hypertension: Secondary | ICD-10-CM | POA: Diagnosis not present

## 2021-09-13 DIAGNOSIS — T675XXA Heat exhaustion, unspecified, initial encounter: Secondary | ICD-10-CM | POA: Diagnosis not present

## 2021-09-13 DIAGNOSIS — D539 Nutritional anemia, unspecified: Secondary | ICD-10-CM | POA: Diagnosis not present

## 2021-09-13 DIAGNOSIS — Z96649 Presence of unspecified artificial hip joint: Secondary | ICD-10-CM | POA: Diagnosis not present

## 2021-09-13 DIAGNOSIS — D72829 Elevated white blood cell count, unspecified: Secondary | ICD-10-CM | POA: Diagnosis not present

## 2021-09-13 DIAGNOSIS — D509 Iron deficiency anemia, unspecified: Secondary | ICD-10-CM | POA: Diagnosis not present

## 2021-09-13 DIAGNOSIS — R0602 Shortness of breath: Secondary | ICD-10-CM | POA: Diagnosis not present

## 2021-09-13 DIAGNOSIS — D75838 Other thrombocytosis: Secondary | ICD-10-CM | POA: Diagnosis not present

## 2021-09-13 DIAGNOSIS — R112 Nausea with vomiting, unspecified: Secondary | ICD-10-CM | POA: Diagnosis not present

## 2021-09-13 DIAGNOSIS — R918 Other nonspecific abnormal finding of lung field: Secondary | ICD-10-CM | POA: Diagnosis not present

## 2021-09-26 DIAGNOSIS — K529 Noninfective gastroenteritis and colitis, unspecified: Secondary | ICD-10-CM | POA: Diagnosis not present

## 2021-09-26 DIAGNOSIS — E876 Hypokalemia: Secondary | ICD-10-CM | POA: Diagnosis not present

## 2021-09-26 DIAGNOSIS — K5730 Diverticulosis of large intestine without perforation or abscess without bleeding: Secondary | ICD-10-CM | POA: Diagnosis not present

## 2021-09-26 DIAGNOSIS — E860 Dehydration: Secondary | ICD-10-CM | POA: Diagnosis not present

## 2021-09-26 DIAGNOSIS — E878 Other disorders of electrolyte and fluid balance, not elsewhere classified: Secondary | ICD-10-CM | POA: Diagnosis not present

## 2021-09-26 DIAGNOSIS — E785 Hyperlipidemia, unspecified: Secondary | ICD-10-CM | POA: Diagnosis not present

## 2021-09-26 DIAGNOSIS — R262 Difficulty in walking, not elsewhere classified: Secondary | ICD-10-CM | POA: Diagnosis not present

## 2021-09-26 DIAGNOSIS — R197 Diarrhea, unspecified: Secondary | ICD-10-CM | POA: Diagnosis not present

## 2021-09-26 DIAGNOSIS — M6281 Muscle weakness (generalized): Secondary | ICD-10-CM | POA: Diagnosis not present

## 2021-09-26 DIAGNOSIS — K5790 Diverticulosis of intestine, part unspecified, without perforation or abscess without bleeding: Secondary | ICD-10-CM | POA: Diagnosis not present

## 2021-09-26 DIAGNOSIS — K8020 Calculus of gallbladder without cholecystitis without obstruction: Secondary | ICD-10-CM | POA: Diagnosis not present

## 2021-09-26 DIAGNOSIS — Z96649 Presence of unspecified artificial hip joint: Secondary | ICD-10-CM | POA: Diagnosis not present

## 2021-09-26 DIAGNOSIS — I10 Essential (primary) hypertension: Secondary | ICD-10-CM | POA: Diagnosis not present

## 2021-09-26 DIAGNOSIS — K449 Diaphragmatic hernia without obstruction or gangrene: Secondary | ICD-10-CM | POA: Diagnosis not present

## 2021-09-26 DIAGNOSIS — R8279 Other abnormal findings on microbiological examination of urine: Secondary | ICD-10-CM | POA: Diagnosis not present

## 2021-09-26 DIAGNOSIS — R279 Unspecified lack of coordination: Secondary | ICD-10-CM | POA: Diagnosis not present

## 2021-09-26 DIAGNOSIS — D509 Iron deficiency anemia, unspecified: Secondary | ICD-10-CM | POA: Diagnosis not present

## 2021-09-26 DIAGNOSIS — Z789 Other specified health status: Secondary | ICD-10-CM | POA: Diagnosis not present

## 2021-09-26 DIAGNOSIS — K3580 Unspecified acute appendicitis: Secondary | ICD-10-CM | POA: Diagnosis not present

## 2021-09-26 DIAGNOSIS — D72829 Elevated white blood cell count, unspecified: Secondary | ICD-10-CM | POA: Diagnosis not present

## 2021-09-26 DIAGNOSIS — R109 Unspecified abdominal pain: Secondary | ICD-10-CM | POA: Diagnosis not present

## 2021-09-26 DIAGNOSIS — R609 Edema, unspecified: Secondary | ICD-10-CM | POA: Diagnosis not present

## 2021-09-26 DIAGNOSIS — I454 Nonspecific intraventricular block: Secondary | ICD-10-CM | POA: Diagnosis not present

## 2021-09-26 DIAGNOSIS — M549 Dorsalgia, unspecified: Secondary | ICD-10-CM | POA: Diagnosis not present

## 2021-09-26 DIAGNOSIS — R933 Abnormal findings on diagnostic imaging of other parts of digestive tract: Secondary | ICD-10-CM | POA: Diagnosis not present

## 2021-09-26 DIAGNOSIS — J45909 Unspecified asthma, uncomplicated: Secondary | ICD-10-CM | POA: Diagnosis not present

## 2021-09-26 DIAGNOSIS — N281 Cyst of kidney, acquired: Secondary | ICD-10-CM | POA: Diagnosis not present

## 2021-09-27 DIAGNOSIS — D72829 Elevated white blood cell count, unspecified: Secondary | ICD-10-CM | POA: Diagnosis not present

## 2021-09-27 DIAGNOSIS — R933 Abnormal findings on diagnostic imaging of other parts of digestive tract: Secondary | ICD-10-CM | POA: Diagnosis not present

## 2021-09-28 DIAGNOSIS — D72829 Elevated white blood cell count, unspecified: Secondary | ICD-10-CM | POA: Diagnosis not present

## 2021-09-28 DIAGNOSIS — R933 Abnormal findings on diagnostic imaging of other parts of digestive tract: Secondary | ICD-10-CM | POA: Diagnosis not present

## 2021-09-28 DIAGNOSIS — R197 Diarrhea, unspecified: Secondary | ICD-10-CM | POA: Diagnosis not present

## 2021-09-29 DIAGNOSIS — R933 Abnormal findings on diagnostic imaging of other parts of digestive tract: Secondary | ICD-10-CM | POA: Diagnosis not present

## 2021-09-29 DIAGNOSIS — R197 Diarrhea, unspecified: Secondary | ICD-10-CM | POA: Diagnosis not present

## 2021-09-29 DIAGNOSIS — D72829 Elevated white blood cell count, unspecified: Secondary | ICD-10-CM | POA: Diagnosis not present

## 2021-10-05 DIAGNOSIS — R262 Difficulty in walking, not elsewhere classified: Secondary | ICD-10-CM | POA: Diagnosis not present

## 2021-10-05 DIAGNOSIS — Z96649 Presence of unspecified artificial hip joint: Secondary | ICD-10-CM | POA: Diagnosis not present

## 2021-10-05 DIAGNOSIS — M549 Dorsalgia, unspecified: Secondary | ICD-10-CM | POA: Diagnosis not present

## 2021-10-05 DIAGNOSIS — R279 Unspecified lack of coordination: Secondary | ICD-10-CM | POA: Diagnosis not present

## 2021-10-05 DIAGNOSIS — E785 Hyperlipidemia, unspecified: Secondary | ICD-10-CM | POA: Diagnosis not present

## 2021-10-05 DIAGNOSIS — E878 Other disorders of electrolyte and fluid balance, not elsewhere classified: Secondary | ICD-10-CM | POA: Diagnosis not present

## 2021-10-05 DIAGNOSIS — R9431 Abnormal electrocardiogram [ECG] [EKG]: Secondary | ICD-10-CM | POA: Diagnosis not present

## 2021-10-05 DIAGNOSIS — R918 Other nonspecific abnormal finding of lung field: Secondary | ICD-10-CM | POA: Diagnosis not present

## 2021-10-05 DIAGNOSIS — D72829 Elevated white blood cell count, unspecified: Secondary | ICD-10-CM | POA: Diagnosis not present

## 2021-10-05 DIAGNOSIS — R2242 Localized swelling, mass and lump, left lower limb: Secondary | ICD-10-CM | POA: Diagnosis not present

## 2021-10-05 DIAGNOSIS — K529 Noninfective gastroenteritis and colitis, unspecified: Secondary | ICD-10-CM | POA: Diagnosis not present

## 2021-10-05 DIAGNOSIS — M6281 Muscle weakness (generalized): Secondary | ICD-10-CM | POA: Diagnosis not present

## 2021-10-05 DIAGNOSIS — Z789 Other specified health status: Secondary | ICD-10-CM | POA: Diagnosis not present

## 2021-10-05 DIAGNOSIS — D509 Iron deficiency anemia, unspecified: Secondary | ICD-10-CM | POA: Diagnosis not present

## 2021-10-05 DIAGNOSIS — M25572 Pain in left ankle and joints of left foot: Secondary | ICD-10-CM | POA: Diagnosis not present

## 2021-10-05 DIAGNOSIS — Z743 Need for continuous supervision: Secondary | ICD-10-CM | POA: Diagnosis not present

## 2021-10-05 DIAGNOSIS — N179 Acute kidney failure, unspecified: Secondary | ICD-10-CM | POA: Diagnosis not present

## 2021-10-05 DIAGNOSIS — G9341 Metabolic encephalopathy: Secondary | ICD-10-CM | POA: Diagnosis not present

## 2021-10-05 DIAGNOSIS — R609 Edema, unspecified: Secondary | ICD-10-CM | POA: Diagnosis not present

## 2021-10-05 DIAGNOSIS — R531 Weakness: Secondary | ICD-10-CM | POA: Diagnosis not present

## 2021-10-05 DIAGNOSIS — I10 Essential (primary) hypertension: Secondary | ICD-10-CM | POA: Diagnosis not present

## 2021-10-05 DIAGNOSIS — J45909 Unspecified asthma, uncomplicated: Secondary | ICD-10-CM | POA: Diagnosis not present

## 2021-10-05 DIAGNOSIS — E876 Hypokalemia: Secondary | ICD-10-CM | POA: Diagnosis not present

## 2021-10-06 DIAGNOSIS — J45909 Unspecified asthma, uncomplicated: Secondary | ICD-10-CM | POA: Diagnosis not present

## 2021-10-06 DIAGNOSIS — K529 Noninfective gastroenteritis and colitis, unspecified: Secondary | ICD-10-CM | POA: Diagnosis not present

## 2021-10-09 DIAGNOSIS — G9341 Metabolic encephalopathy: Secondary | ICD-10-CM | POA: Diagnosis not present

## 2021-10-09 DIAGNOSIS — I10 Essential (primary) hypertension: Secondary | ICD-10-CM | POA: Diagnosis not present

## 2021-10-09 DIAGNOSIS — N179 Acute kidney failure, unspecified: Secondary | ICD-10-CM | POA: Diagnosis not present

## 2021-10-09 DIAGNOSIS — M25572 Pain in left ankle and joints of left foot: Secondary | ICD-10-CM | POA: Diagnosis not present

## 2021-10-09 DIAGNOSIS — R609 Edema, unspecified: Secondary | ICD-10-CM | POA: Diagnosis not present

## 2021-10-09 DIAGNOSIS — K529 Noninfective gastroenteritis and colitis, unspecified: Secondary | ICD-10-CM | POA: Diagnosis not present

## 2021-10-09 DIAGNOSIS — D72829 Elevated white blood cell count, unspecified: Secondary | ICD-10-CM | POA: Diagnosis not present

## 2021-10-09 DIAGNOSIS — R2242 Localized swelling, mass and lump, left lower limb: Secondary | ICD-10-CM | POA: Diagnosis not present

## 2021-10-09 DIAGNOSIS — J45909 Unspecified asthma, uncomplicated: Secondary | ICD-10-CM | POA: Diagnosis not present

## 2021-10-09 DIAGNOSIS — R918 Other nonspecific abnormal finding of lung field: Secondary | ICD-10-CM | POA: Diagnosis not present

## 2021-10-09 DIAGNOSIS — Z96649 Presence of unspecified artificial hip joint: Secondary | ICD-10-CM | POA: Diagnosis not present

## 2021-10-10 DIAGNOSIS — G9341 Metabolic encephalopathy: Secondary | ICD-10-CM | POA: Diagnosis not present

## 2021-10-10 DIAGNOSIS — D72829 Elevated white blood cell count, unspecified: Secondary | ICD-10-CM | POA: Diagnosis not present

## 2021-10-10 DIAGNOSIS — N179 Acute kidney failure, unspecified: Secondary | ICD-10-CM | POA: Diagnosis not present

## 2021-10-11 DIAGNOSIS — Z96641 Presence of right artificial hip joint: Secondary | ICD-10-CM | POA: Diagnosis not present

## 2021-10-11 DIAGNOSIS — Z7401 Bed confinement status: Secondary | ICD-10-CM | POA: Diagnosis not present

## 2021-10-11 DIAGNOSIS — N179 Acute kidney failure, unspecified: Secondary | ICD-10-CM | POA: Diagnosis not present

## 2021-10-11 DIAGNOSIS — M6281 Muscle weakness (generalized): Secondary | ICD-10-CM | POA: Diagnosis not present

## 2021-10-11 DIAGNOSIS — G9341 Metabolic encephalopathy: Secondary | ICD-10-CM | POA: Diagnosis not present

## 2021-10-11 DIAGNOSIS — Z743 Need for continuous supervision: Secondary | ICD-10-CM | POA: Diagnosis not present

## 2021-10-11 DIAGNOSIS — D72829 Elevated white blood cell count, unspecified: Secondary | ICD-10-CM | POA: Diagnosis not present

## 2021-10-16 DIAGNOSIS — I10 Essential (primary) hypertension: Secondary | ICD-10-CM | POA: Diagnosis not present

## 2021-10-23 DIAGNOSIS — D509 Iron deficiency anemia, unspecified: Secondary | ICD-10-CM | POA: Diagnosis not present

## 2021-10-23 DIAGNOSIS — R7989 Other specified abnormal findings of blood chemistry: Secondary | ICD-10-CM | POA: Diagnosis not present

## 2021-10-23 DIAGNOSIS — Z09 Encounter for follow-up examination after completed treatment for conditions other than malignant neoplasm: Secondary | ICD-10-CM | POA: Diagnosis not present

## 2021-10-23 DIAGNOSIS — G3184 Mild cognitive impairment, so stated: Secondary | ICD-10-CM | POA: Diagnosis not present

## 2021-10-23 DIAGNOSIS — D649 Anemia, unspecified: Secondary | ICD-10-CM | POA: Diagnosis not present

## 2021-10-23 DIAGNOSIS — E876 Hypokalemia: Secondary | ICD-10-CM | POA: Diagnosis not present

## 2021-11-23 DIAGNOSIS — Z23 Encounter for immunization: Secondary | ICD-10-CM | POA: Diagnosis not present

## 2021-11-23 DIAGNOSIS — E876 Hypokalemia: Secondary | ICD-10-CM | POA: Diagnosis not present

## 2021-11-23 DIAGNOSIS — I10 Essential (primary) hypertension: Secondary | ICD-10-CM | POA: Diagnosis not present

## 2021-11-23 DIAGNOSIS — G3184 Mild cognitive impairment, so stated: Secondary | ICD-10-CM | POA: Diagnosis not present

## 2021-11-23 DIAGNOSIS — Z0000 Encounter for general adult medical examination without abnormal findings: Secondary | ICD-10-CM | POA: Diagnosis not present

## 2021-11-23 DIAGNOSIS — D649 Anemia, unspecified: Secondary | ICD-10-CM | POA: Diagnosis not present

## 2021-11-23 DIAGNOSIS — L304 Erythema intertrigo: Secondary | ICD-10-CM | POA: Diagnosis not present

## 2021-12-06 DIAGNOSIS — I10 Essential (primary) hypertension: Secondary | ICD-10-CM | POA: Diagnosis not present

## 2021-12-06 DIAGNOSIS — J4541 Moderate persistent asthma with (acute) exacerbation: Secondary | ICD-10-CM | POA: Diagnosis not present

## 2021-12-06 DIAGNOSIS — Z20822 Contact with and (suspected) exposure to covid-19: Secondary | ICD-10-CM | POA: Diagnosis not present

## 2021-12-08 DIAGNOSIS — J4541 Moderate persistent asthma with (acute) exacerbation: Secondary | ICD-10-CM | POA: Diagnosis not present

## 2021-12-18 DIAGNOSIS — D509 Iron deficiency anemia, unspecified: Secondary | ICD-10-CM | POA: Diagnosis not present

## 2022-01-06 DIAGNOSIS — J4521 Mild intermittent asthma with (acute) exacerbation: Secondary | ICD-10-CM | POA: Diagnosis not present

## 2022-01-06 DIAGNOSIS — Z741 Need for assistance with personal care: Secondary | ICD-10-CM | POA: Diagnosis not present

## 2022-02-08 DIAGNOSIS — F03B0 Unspecified dementia, moderate, without behavioral disturbance, psychotic disturbance, mood disturbance, and anxiety: Secondary | ICD-10-CM | POA: Diagnosis not present

## 2022-04-23 DIAGNOSIS — D509 Iron deficiency anemia, unspecified: Secondary | ICD-10-CM | POA: Diagnosis not present

## 2022-04-25 DIAGNOSIS — Z741 Need for assistance with personal care: Secondary | ICD-10-CM | POA: Diagnosis not present

## 2022-04-25 DIAGNOSIS — J4541 Moderate persistent asthma with (acute) exacerbation: Secondary | ICD-10-CM | POA: Diagnosis not present

## 2022-05-11 DIAGNOSIS — Z853 Personal history of malignant neoplasm of breast: Secondary | ICD-10-CM | POA: Diagnosis not present

## 2022-05-11 DIAGNOSIS — R918 Other nonspecific abnormal finding of lung field: Secondary | ICD-10-CM | POA: Diagnosis not present

## 2022-05-11 DIAGNOSIS — I5032 Chronic diastolic (congestive) heart failure: Secondary | ICD-10-CM | POA: Diagnosis not present

## 2022-06-23 DIAGNOSIS — J4531 Mild persistent asthma with (acute) exacerbation: Secondary | ICD-10-CM | POA: Diagnosis not present

## 2022-07-03 NOTE — Progress Notes (Unsigned)
No chief complaint on file.   HPI: [name] 89 y.o. come in after absdence of 3 yesar  fo actue appt  last seen vido 5 4 2021  Care was in [place]   has been under care of  geriatric  care and hospt encounter [facility] 3 2024  chf dementia  ROS: See pertinent positives and negatives per HPI.  Past Medical History:  Diagnosis Date   Allergy    Anemia 2011   Since last visit she has had a Gi evaluation for newer onset Iron deficiiency anemia that was felt secondary to avm in duodenum rx with laser ablation.   Arthritis    in back   Asthma    Cancer ([facility])    rt breast   GERD (gastroesophageal reflux disease)    Hyperlipidemia    Hypertension    Lower extremity edema    ankles, feet    Sleep apnea     Family History  Problem Relation Age of Onset   Cancer Mother        breast and ovarian    Social History   Socioeconomic History   Marital status: Widowed    Spouse name: Not on file   Number of children: Not on file   Years of education: Not on file   Highest education level: Not on file  Occupational History   Not on file  Tobacco Use   Smoking status: Former    Packs/day: 0.25    Years: 4.00    Additional pack years: 0.00    Total pack years: 1.00    Types: Cigarettes    Quit date: 03/05/1968    Years since quitting: 54.3   Smokeless tobacco: Never  Vaping Use   Vaping Use: Never used  Substance and Sexual Activity   Alcohol use: Not Currently    Comment: on occ    Drug use: No   Sexual activity: Not on file  Other Topics Concern   Not on file  Social History Narrative   Widowed 2001    Ex smoker   HHof 1    Active in church and volunteer activities    Bowls a few times per week   Swimming 2 x per week.    Tobacco 49=58 less than 1ppd.             Social Determinants of Health   Financial Resource Strain: Not on file  Food Insecurity: Not on file  Transportation Needs: Not on file  Physical Activity: Not on file  Stress: Not on file  Social  Connections: Not on file    Outpatient Medications Prior to Visit  Medication Sig Dispense Refill   amLODipine (NORVASC) 5 MG tablet TAKE 1 TABLET BY MOUTH EVERY DAY 30 tablet 0   budesonide-formoterol (SYMBICORT) 160-4.5 MCG/ACT inhaler Inhale 2 puffs into the lungs 2 (two) times daily as needed (Shortness of breath).      ferrous sulfate 325 (65 FE) MG tablet Take 1 po bid with 1/2 glass of orange juice Monday  Wednesday  Friday and Sunday at least 30-60  minutes before a meal . 90 tablet 1   fexofenadine (ALLEGRA) 180 MG tablet Take 180 mg by mouth daily as needed for allergies or rhinitis.     lisinopril-hydrochlorothiazide (ZESTORETIC) 20-12.5 MG tablet TAKE 1 TABLET BY MOUTH TWICE DAILY 180 tablet 0   PROAIR HFA 108 (90 Base) MCG/ACT inhaler      No facility-administered medications prior to visit.  EXAM:  There were no vitals taken for this visit.  There is no height or weight on file to calculate BMI.  GENERAL: vitals reviewed and listed above, alert, oriented, appears well hydrated and in no acute distress HEENT: atraumatic, conjunctiva  clear, no obvious abnormalities on inspection of external nose and ears OP : no lesion edema or exudate  NECK: no obvious masses on inspection palpation  LUNGS: clear to auscultation bilaterally, no wheezes, rales or rhonchi, good air movement CV: HRRR, no clubbing cyanosis or  peripheral edema nl cap refill  MS: moves all extremities without noticeable focal  abnormality PSYCH: pleasant and cooperative, no obvious depression or anxiety Lab Results  Component Value Date   WBC 7.9 07/03/2019   HGB 9.2 (A) 07/03/2019   HCT 33 (A) 07/03/2019   PLT 499 (A) 07/03/2019   GLUCOSE 102 (H) 02/27/2018   CHOL 222 (H) 04/21/2018   TRIG 106.0 04/21/2018   HDL 77.40 04/21/2018   LDLCALC 124 (H) 04/21/2018   ALT 10 07/03/2019   AST 20 07/03/2019   NA 143 07/03/2019   K 4.4 07/03/2019   CL 105 07/03/2019   CREATININE 0.6 07/03/2019   BUN 12  07/03/2019   CO2 31 (A) 07/03/2019   TSH 0.47 04/21/2018   INR 1.03 02/27/2018   HGBA1C 5.1 07/03/2019   BP Readings from Last 3 Encounters:  04/21/18 128/66  03/20/18 116/60  03/03/18 132/64  CR XR CHEST 2 VIEWS 07/18/2020 9:42 AM  FINDINGS: Lungs: Hyperexpanded lung fields consistent with COPD. No focal consolidation Pleural spaces: Unremarkable. No pleural effusion. No pneumothorax. Heart/Mediastinum: 7.7 cm air-fluid collection superimposed over the heart consistent with hiatal hernia.. Stable cardiac silhouette Bones/joints: Unremarkable.  IMPRESSION: 7.7 cm air-fluid collection superimposed over the heart consistent with hiatal hernia.Marland Kitchen  THIS DOCUMENT HAS BEEN ELECTRONICALLY VERIFIED BY Paulita Fujita MD ON 05/14/2022 19:55:29  Exam End: 05/11/22 14:46    ASSESSMENT AND PLAN:  Discussed the following assessment and plan:  No diagnosis found.  -Patient advised to return or notify health care team  if  new concerns arise.  There are no Patient Instructions on file for this visit.   Neta Mends. [name] M.D.

## 2022-07-05 ENCOUNTER — Encounter: Payer: Self-pay | Admitting: Internal Medicine

## 2022-07-05 ENCOUNTER — Ambulatory Visit (INDEPENDENT_AMBULATORY_CARE_PROVIDER_SITE_OTHER): Payer: Medicare Other | Admitting: Internal Medicine

## 2022-07-05 VITALS — BP 90/56 | HR 71 | Temp 98.0°F | Ht 60.0 in | Wt 145.0 lb

## 2022-07-05 DIAGNOSIS — J4521 Mild intermittent asthma with (acute) exacerbation: Secondary | ICD-10-CM | POA: Diagnosis not present

## 2022-07-05 DIAGNOSIS — Z79899 Other long term (current) drug therapy: Secondary | ICD-10-CM | POA: Diagnosis not present

## 2022-07-05 DIAGNOSIS — I5032 Chronic diastolic (congestive) heart failure: Secondary | ICD-10-CM | POA: Diagnosis not present

## 2022-07-05 DIAGNOSIS — I10 Essential (primary) hypertension: Secondary | ICD-10-CM

## 2022-07-05 NOTE — Patient Instructions (Signed)
Good to see  you today .   Take the symbicort  2 puffs twice a day .  Every day for control of wheezing asthma .  And use albuterol / nebulizer as needed.Marland Kitchen  Hopefully wont need  extra for very long.  Twitchy lungs can last a month after a flare.  Oxygen level today 95 . See primary care   within in next month   for fu of wheezing  control.   If having to over use  albuterol see your medical team  earlier

## 2022-07-06 ENCOUNTER — Telehealth: Payer: Self-pay | Admitting: Internal Medicine

## 2022-07-06 DIAGNOSIS — Z0279 Encounter for issue of other medical certificate: Secondary | ICD-10-CM

## 2022-07-06 NOTE — Telephone Encounter (Signed)
Patient dropped off document  Medical Clearance , to be filled out by provider. Patient requested to send it via Fax within ASAP. Document is located in providers tray at front office.Please advise at Mobile [phone] (mobile)

## 2022-07-11 ENCOUNTER — Telehealth: Payer: Self-pay | Admitting: Internal Medicine

## 2022-07-11 DIAGNOSIS — Z0279 Encounter for issue of other medical certificate: Secondary | ICD-10-CM

## 2022-07-11 NOTE — Telephone Encounter (Signed)
Pt daughter call and want to know if the form have been fill and and faxed she want a call back when done.

## 2022-07-11 NOTE — Telephone Encounter (Signed)
Spoke to [name] and inform her we have faxed the form to [facility]. Verbalized understanding.

## 2022-07-16 ENCOUNTER — Telehealth: Payer: Self-pay

## 2022-07-16 NOTE — Telephone Encounter (Signed)
Received fax from [facility] stating that they are needing for [place] to complete one or both of last two forms in the attached packet pertaining to TB Screening reports. And to send complete visit notes as well from 5/2/024.   Spoke to daughter Valerie(on DPR). She states to disregard the TB form as pt already had one at where she is at in [facility] in [place].

## 2022-07-25 NOTE — Telephone Encounter (Signed)
This as already done

## 2022-08-06 DIAGNOSIS — I110 Hypertensive heart disease with heart failure: Secondary | ICD-10-CM | POA: Diagnosis not present

## 2022-08-06 DIAGNOSIS — J309 Allergic rhinitis, unspecified: Secondary | ICD-10-CM | POA: Diagnosis not present

## 2022-08-06 DIAGNOSIS — I5032 Chronic diastolic (congestive) heart failure: Secondary | ICD-10-CM | POA: Diagnosis not present

## 2022-08-06 DIAGNOSIS — F03B0 Unspecified dementia, moderate, without behavioral disturbance, psychotic disturbance, mood disturbance, and anxiety: Secondary | ICD-10-CM | POA: Diagnosis not present

## 2022-08-06 DIAGNOSIS — E782 Mixed hyperlipidemia: Secondary | ICD-10-CM | POA: Diagnosis not present

## 2022-08-06 DIAGNOSIS — T148XXA Other injury of unspecified body region, initial encounter: Secondary | ICD-10-CM | POA: Diagnosis not present

## 2022-08-06 DIAGNOSIS — Z79899 Other long term (current) drug therapy: Secondary | ICD-10-CM | POA: Diagnosis not present

## 2022-09-23 DIAGNOSIS — Z741 Need for assistance with personal care: Secondary | ICD-10-CM | POA: Diagnosis not present

## 2022-09-23 DIAGNOSIS — J4521 Mild intermittent asthma with (acute) exacerbation: Secondary | ICD-10-CM | POA: Diagnosis not present

## 2022-11-06 DIAGNOSIS — R9431 Abnormal electrocardiogram [ECG] [EKG]: Secondary | ICD-10-CM | POA: Diagnosis not present

## 2022-11-06 DIAGNOSIS — R609 Edema, unspecified: Secondary | ICD-10-CM | POA: Diagnosis not present

## 2022-11-06 DIAGNOSIS — I10 Essential (primary) hypertension: Secondary | ICD-10-CM | POA: Diagnosis not present

## 2022-11-06 DIAGNOSIS — I491 Atrial premature depolarization: Secondary | ICD-10-CM | POA: Diagnosis not present

## 2022-11-06 DIAGNOSIS — R519 Headache, unspecified: Secondary | ICD-10-CM | POA: Diagnosis not present

## 2022-11-06 DIAGNOSIS — R410 Disorientation, unspecified: Secondary | ICD-10-CM | POA: Diagnosis not present

## 2022-11-06 DIAGNOSIS — Z743 Need for continuous supervision: Secondary | ICD-10-CM | POA: Diagnosis not present

## 2022-11-06 DIAGNOSIS — R531 Weakness: Secondary | ICD-10-CM | POA: Diagnosis not present

## 2022-11-12 DIAGNOSIS — G309 Alzheimer's disease, unspecified: Secondary | ICD-10-CM | POA: Diagnosis not present

## 2023-01-04 DIAGNOSIS — M25552 Pain in left hip: Secondary | ICD-10-CM | POA: Diagnosis not present

## 2023-01-04 DIAGNOSIS — Z96642 Presence of left artificial hip joint: Secondary | ICD-10-CM | POA: Diagnosis not present

## 2023-01-04 DIAGNOSIS — Z96643 Presence of artificial hip joint, bilateral: Secondary | ICD-10-CM | POA: Diagnosis not present

## 2023-01-04 DIAGNOSIS — M61552 Other ossification of muscle, left thigh: Secondary | ICD-10-CM | POA: Diagnosis not present

## 2023-01-09 DIAGNOSIS — Z9181 History of falling: Secondary | ICD-10-CM | POA: Diagnosis not present

## 2023-01-09 DIAGNOSIS — Z96643 Presence of artificial hip joint, bilateral: Secondary | ICD-10-CM | POA: Diagnosis not present

## 2023-01-09 DIAGNOSIS — I110 Hypertensive heart disease with heart failure: Secondary | ICD-10-CM | POA: Diagnosis not present

## 2023-01-09 DIAGNOSIS — I5032 Chronic diastolic (congestive) heart failure: Secondary | ICD-10-CM | POA: Diagnosis not present

## 2023-01-09 DIAGNOSIS — D509 Iron deficiency anemia, unspecified: Secondary | ICD-10-CM | POA: Diagnosis not present

## 2023-01-09 DIAGNOSIS — J309 Allergic rhinitis, unspecified: Secondary | ICD-10-CM | POA: Diagnosis not present

## 2023-01-09 DIAGNOSIS — E559 Vitamin D deficiency, unspecified: Secondary | ICD-10-CM | POA: Diagnosis not present

## 2023-01-09 DIAGNOSIS — M5416 Radiculopathy, lumbar region: Secondary | ICD-10-CM | POA: Diagnosis not present

## 2023-01-09 DIAGNOSIS — M1711 Unilateral primary osteoarthritis, right knee: Secondary | ICD-10-CM | POA: Diagnosis not present

## 2023-01-09 DIAGNOSIS — Z853 Personal history of malignant neoplasm of breast: Secondary | ICD-10-CM | POA: Diagnosis not present

## 2023-01-09 DIAGNOSIS — E785 Hyperlipidemia, unspecified: Secondary | ICD-10-CM | POA: Diagnosis not present

## 2023-01-09 DIAGNOSIS — K219 Gastro-esophageal reflux disease without esophagitis: Secondary | ICD-10-CM | POA: Diagnosis not present

## 2023-01-09 DIAGNOSIS — K5730 Diverticulosis of large intestine without perforation or abscess without bleeding: Secondary | ICD-10-CM | POA: Diagnosis not present

## 2023-01-16 DIAGNOSIS — Z853 Personal history of malignant neoplasm of breast: Secondary | ICD-10-CM | POA: Diagnosis not present

## 2023-01-16 DIAGNOSIS — K5730 Diverticulosis of large intestine without perforation or abscess without bleeding: Secondary | ICD-10-CM | POA: Diagnosis not present

## 2023-01-16 DIAGNOSIS — D509 Iron deficiency anemia, unspecified: Secondary | ICD-10-CM | POA: Diagnosis not present

## 2023-01-16 DIAGNOSIS — K219 Gastro-esophageal reflux disease without esophagitis: Secondary | ICD-10-CM | POA: Diagnosis not present

## 2023-01-16 DIAGNOSIS — M5416 Radiculopathy, lumbar region: Secondary | ICD-10-CM | POA: Diagnosis not present

## 2023-01-16 DIAGNOSIS — J309 Allergic rhinitis, unspecified: Secondary | ICD-10-CM | POA: Diagnosis not present

## 2023-01-16 DIAGNOSIS — I110 Hypertensive heart disease with heart failure: Secondary | ICD-10-CM | POA: Diagnosis not present

## 2023-01-16 DIAGNOSIS — M1711 Unilateral primary osteoarthritis, right knee: Secondary | ICD-10-CM | POA: Diagnosis not present

## 2023-01-16 DIAGNOSIS — E785 Hyperlipidemia, unspecified: Secondary | ICD-10-CM | POA: Diagnosis not present

## 2023-01-16 DIAGNOSIS — I5032 Chronic diastolic (congestive) heart failure: Secondary | ICD-10-CM | POA: Diagnosis not present

## 2023-01-16 DIAGNOSIS — Z9181 History of falling: Secondary | ICD-10-CM | POA: Diagnosis not present

## 2023-01-16 DIAGNOSIS — Z96643 Presence of artificial hip joint, bilateral: Secondary | ICD-10-CM | POA: Diagnosis not present

## 2023-01-16 DIAGNOSIS — E559 Vitamin D deficiency, unspecified: Secondary | ICD-10-CM | POA: Diagnosis not present

## 2023-01-23 DIAGNOSIS — Z96643 Presence of artificial hip joint, bilateral: Secondary | ICD-10-CM | POA: Diagnosis not present

## 2023-01-23 DIAGNOSIS — D509 Iron deficiency anemia, unspecified: Secondary | ICD-10-CM | POA: Diagnosis not present

## 2023-01-23 DIAGNOSIS — Z9181 History of falling: Secondary | ICD-10-CM | POA: Diagnosis not present

## 2023-01-23 DIAGNOSIS — Z853 Personal history of malignant neoplasm of breast: Secondary | ICD-10-CM | POA: Diagnosis not present

## 2023-01-23 DIAGNOSIS — M5416 Radiculopathy, lumbar region: Secondary | ICD-10-CM | POA: Diagnosis not present

## 2023-01-23 DIAGNOSIS — J309 Allergic rhinitis, unspecified: Secondary | ICD-10-CM | POA: Diagnosis not present

## 2023-01-23 DIAGNOSIS — I5032 Chronic diastolic (congestive) heart failure: Secondary | ICD-10-CM | POA: Diagnosis not present

## 2023-01-23 DIAGNOSIS — E785 Hyperlipidemia, unspecified: Secondary | ICD-10-CM | POA: Diagnosis not present

## 2023-01-23 DIAGNOSIS — I110 Hypertensive heart disease with heart failure: Secondary | ICD-10-CM | POA: Diagnosis not present

## 2023-01-23 DIAGNOSIS — K219 Gastro-esophageal reflux disease without esophagitis: Secondary | ICD-10-CM | POA: Diagnosis not present

## 2023-01-23 DIAGNOSIS — M1711 Unilateral primary osteoarthritis, right knee: Secondary | ICD-10-CM | POA: Diagnosis not present

## 2023-01-23 DIAGNOSIS — E559 Vitamin D deficiency, unspecified: Secondary | ICD-10-CM | POA: Diagnosis not present

## 2023-01-23 DIAGNOSIS — K5730 Diverticulosis of large intestine without perforation or abscess without bleeding: Secondary | ICD-10-CM | POA: Diagnosis not present

## 2023-01-30 DIAGNOSIS — Z96643 Presence of artificial hip joint, bilateral: Secondary | ICD-10-CM | POA: Diagnosis not present

## 2023-01-30 DIAGNOSIS — K5730 Diverticulosis of large intestine without perforation or abscess without bleeding: Secondary | ICD-10-CM | POA: Diagnosis not present

## 2023-01-30 DIAGNOSIS — E785 Hyperlipidemia, unspecified: Secondary | ICD-10-CM | POA: Diagnosis not present

## 2023-01-30 DIAGNOSIS — J309 Allergic rhinitis, unspecified: Secondary | ICD-10-CM | POA: Diagnosis not present

## 2023-01-30 DIAGNOSIS — I5032 Chronic diastolic (congestive) heart failure: Secondary | ICD-10-CM | POA: Diagnosis not present

## 2023-01-30 DIAGNOSIS — M5416 Radiculopathy, lumbar region: Secondary | ICD-10-CM | POA: Diagnosis not present

## 2023-01-30 DIAGNOSIS — Z9181 History of falling: Secondary | ICD-10-CM | POA: Diagnosis not present

## 2023-01-30 DIAGNOSIS — E559 Vitamin D deficiency, unspecified: Secondary | ICD-10-CM | POA: Diagnosis not present

## 2023-01-30 DIAGNOSIS — D509 Iron deficiency anemia, unspecified: Secondary | ICD-10-CM | POA: Diagnosis not present

## 2023-01-30 DIAGNOSIS — I110 Hypertensive heart disease with heart failure: Secondary | ICD-10-CM | POA: Diagnosis not present

## 2023-01-30 DIAGNOSIS — M1711 Unilateral primary osteoarthritis, right knee: Secondary | ICD-10-CM | POA: Diagnosis not present

## 2023-01-30 DIAGNOSIS — Z853 Personal history of malignant neoplasm of breast: Secondary | ICD-10-CM | POA: Diagnosis not present

## 2023-01-30 DIAGNOSIS — K219 Gastro-esophageal reflux disease without esophagitis: Secondary | ICD-10-CM | POA: Diagnosis not present

## 2023-02-07 DIAGNOSIS — Z131 Encounter for screening for diabetes mellitus: Secondary | ICD-10-CM | POA: Diagnosis not present

## 2023-02-07 DIAGNOSIS — Z780 Asymptomatic menopausal state: Secondary | ICD-10-CM | POA: Diagnosis not present

## 2023-02-07 DIAGNOSIS — R600 Localized edema: Secondary | ICD-10-CM | POA: Diagnosis not present

## 2023-02-07 DIAGNOSIS — R062 Wheezing: Secondary | ICD-10-CM | POA: Diagnosis not present

## 2023-02-07 DIAGNOSIS — M1990 Unspecified osteoarthritis, unspecified site: Secondary | ICD-10-CM | POA: Diagnosis not present

## 2023-02-07 DIAGNOSIS — Z13820 Encounter for screening for osteoporosis: Secondary | ICD-10-CM | POA: Diagnosis not present

## 2023-02-07 DIAGNOSIS — I10 Essential (primary) hypertension: Secondary | ICD-10-CM | POA: Diagnosis not present

## 2023-02-07 DIAGNOSIS — Z23 Encounter for immunization: Secondary | ICD-10-CM | POA: Diagnosis not present

## 2023-02-08 DIAGNOSIS — J309 Allergic rhinitis, unspecified: Secondary | ICD-10-CM | POA: Diagnosis not present

## 2023-02-08 DIAGNOSIS — K219 Gastro-esophageal reflux disease without esophagitis: Secondary | ICD-10-CM | POA: Diagnosis not present

## 2023-02-08 DIAGNOSIS — I5032 Chronic diastolic (congestive) heart failure: Secondary | ICD-10-CM | POA: Diagnosis not present

## 2023-02-08 DIAGNOSIS — I110 Hypertensive heart disease with heart failure: Secondary | ICD-10-CM | POA: Diagnosis not present

## 2023-02-08 DIAGNOSIS — E559 Vitamin D deficiency, unspecified: Secondary | ICD-10-CM | POA: Diagnosis not present

## 2023-02-08 DIAGNOSIS — K5730 Diverticulosis of large intestine without perforation or abscess without bleeding: Secondary | ICD-10-CM | POA: Diagnosis not present

## 2023-02-08 DIAGNOSIS — Z9181 History of falling: Secondary | ICD-10-CM | POA: Diagnosis not present

## 2023-02-08 DIAGNOSIS — Z96643 Presence of artificial hip joint, bilateral: Secondary | ICD-10-CM | POA: Diagnosis not present

## 2023-02-08 DIAGNOSIS — Z853 Personal history of malignant neoplasm of breast: Secondary | ICD-10-CM | POA: Diagnosis not present

## 2023-02-08 DIAGNOSIS — M1711 Unilateral primary osteoarthritis, right knee: Secondary | ICD-10-CM | POA: Diagnosis not present

## 2023-02-08 DIAGNOSIS — M5416 Radiculopathy, lumbar region: Secondary | ICD-10-CM | POA: Diagnosis not present

## 2023-02-08 DIAGNOSIS — E785 Hyperlipidemia, unspecified: Secondary | ICD-10-CM | POA: Diagnosis not present

## 2023-02-08 DIAGNOSIS — D509 Iron deficiency anemia, unspecified: Secondary | ICD-10-CM | POA: Diagnosis not present

## 2023-02-13 DIAGNOSIS — I5032 Chronic diastolic (congestive) heart failure: Secondary | ICD-10-CM | POA: Diagnosis not present

## 2023-02-13 DIAGNOSIS — D509 Iron deficiency anemia, unspecified: Secondary | ICD-10-CM | POA: Diagnosis not present

## 2023-02-13 DIAGNOSIS — Z853 Personal history of malignant neoplasm of breast: Secondary | ICD-10-CM | POA: Diagnosis not present

## 2023-02-13 DIAGNOSIS — I110 Hypertensive heart disease with heart failure: Secondary | ICD-10-CM | POA: Diagnosis not present

## 2023-02-13 DIAGNOSIS — E785 Hyperlipidemia, unspecified: Secondary | ICD-10-CM | POA: Diagnosis not present

## 2023-02-13 DIAGNOSIS — E559 Vitamin D deficiency, unspecified: Secondary | ICD-10-CM | POA: Diagnosis not present

## 2023-02-13 DIAGNOSIS — J309 Allergic rhinitis, unspecified: Secondary | ICD-10-CM | POA: Diagnosis not present

## 2023-02-13 DIAGNOSIS — K5730 Diverticulosis of large intestine without perforation or abscess without bleeding: Secondary | ICD-10-CM | POA: Diagnosis not present

## 2023-02-13 DIAGNOSIS — M1711 Unilateral primary osteoarthritis, right knee: Secondary | ICD-10-CM | POA: Diagnosis not present

## 2023-02-13 DIAGNOSIS — Z96643 Presence of artificial hip joint, bilateral: Secondary | ICD-10-CM | POA: Diagnosis not present

## 2023-02-13 DIAGNOSIS — Z9181 History of falling: Secondary | ICD-10-CM | POA: Diagnosis not present

## 2023-02-13 DIAGNOSIS — K219 Gastro-esophageal reflux disease without esophagitis: Secondary | ICD-10-CM | POA: Diagnosis not present

## 2023-02-13 DIAGNOSIS — M5416 Radiculopathy, lumbar region: Secondary | ICD-10-CM | POA: Diagnosis not present

## 2023-02-22 DIAGNOSIS — Z853 Personal history of malignant neoplasm of breast: Secondary | ICD-10-CM | POA: Diagnosis not present

## 2023-02-22 DIAGNOSIS — D509 Iron deficiency anemia, unspecified: Secondary | ICD-10-CM | POA: Diagnosis not present

## 2023-02-22 DIAGNOSIS — E559 Vitamin D deficiency, unspecified: Secondary | ICD-10-CM | POA: Diagnosis not present

## 2023-02-22 DIAGNOSIS — M25552 Pain in left hip: Secondary | ICD-10-CM | POA: Diagnosis not present

## 2023-02-22 DIAGNOSIS — Z96643 Presence of artificial hip joint, bilateral: Secondary | ICD-10-CM | POA: Diagnosis not present

## 2023-02-22 DIAGNOSIS — G8929 Other chronic pain: Secondary | ICD-10-CM | POA: Diagnosis not present

## 2023-02-22 DIAGNOSIS — I110 Hypertensive heart disease with heart failure: Secondary | ICD-10-CM | POA: Diagnosis not present

## 2023-02-22 DIAGNOSIS — K219 Gastro-esophageal reflux disease without esophagitis: Secondary | ICD-10-CM | POA: Diagnosis not present

## 2023-02-22 DIAGNOSIS — J309 Allergic rhinitis, unspecified: Secondary | ICD-10-CM | POA: Diagnosis not present

## 2023-02-22 DIAGNOSIS — K5730 Diverticulosis of large intestine without perforation or abscess without bleeding: Secondary | ICD-10-CM | POA: Diagnosis not present

## 2023-02-22 DIAGNOSIS — E785 Hyperlipidemia, unspecified: Secondary | ICD-10-CM | POA: Diagnosis not present

## 2023-02-22 DIAGNOSIS — M5442 Lumbago with sciatica, left side: Secondary | ICD-10-CM | POA: Diagnosis not present

## 2023-02-22 DIAGNOSIS — Z9181 History of falling: Secondary | ICD-10-CM | POA: Diagnosis not present

## 2023-02-22 DIAGNOSIS — M5416 Radiculopathy, lumbar region: Secondary | ICD-10-CM | POA: Diagnosis not present

## 2023-02-22 DIAGNOSIS — M1711 Unilateral primary osteoarthritis, right knee: Secondary | ICD-10-CM | POA: Diagnosis not present

## 2023-02-22 DIAGNOSIS — I5032 Chronic diastolic (congestive) heart failure: Secondary | ICD-10-CM | POA: Diagnosis not present

## 2023-03-08 DIAGNOSIS — E785 Hyperlipidemia, unspecified: Secondary | ICD-10-CM | POA: Diagnosis not present

## 2023-03-08 DIAGNOSIS — K5730 Diverticulosis of large intestine without perforation or abscess without bleeding: Secondary | ICD-10-CM | POA: Diagnosis not present

## 2023-03-08 DIAGNOSIS — I10 Essential (primary) hypertension: Secondary | ICD-10-CM | POA: Diagnosis not present

## 2023-03-08 DIAGNOSIS — D509 Iron deficiency anemia, unspecified: Secondary | ICD-10-CM | POA: Diagnosis not present

## 2023-03-08 DIAGNOSIS — I5032 Chronic diastolic (congestive) heart failure: Secondary | ICD-10-CM | POA: Diagnosis not present

## 2023-03-08 DIAGNOSIS — E559 Vitamin D deficiency, unspecified: Secondary | ICD-10-CM | POA: Diagnosis not present

## 2023-03-08 DIAGNOSIS — I110 Hypertensive heart disease with heart failure: Secondary | ICD-10-CM | POA: Diagnosis not present

## 2023-03-08 DIAGNOSIS — Z96643 Presence of artificial hip joint, bilateral: Secondary | ICD-10-CM | POA: Diagnosis not present

## 2023-03-08 DIAGNOSIS — M1711 Unilateral primary osteoarthritis, right knee: Secondary | ICD-10-CM | POA: Diagnosis not present

## 2023-03-08 DIAGNOSIS — Z853 Personal history of malignant neoplasm of breast: Secondary | ICD-10-CM | POA: Diagnosis not present

## 2023-03-08 DIAGNOSIS — M5416 Radiculopathy, lumbar region: Secondary | ICD-10-CM | POA: Diagnosis not present

## 2023-03-08 DIAGNOSIS — Z79899 Other long term (current) drug therapy: Secondary | ICD-10-CM | POA: Diagnosis not present

## 2023-03-08 DIAGNOSIS — Z9181 History of falling: Secondary | ICD-10-CM | POA: Diagnosis not present

## 2023-03-08 DIAGNOSIS — K219 Gastro-esophageal reflux disease without esophagitis: Secondary | ICD-10-CM | POA: Diagnosis not present

## 2023-03-08 DIAGNOSIS — Z131 Encounter for screening for diabetes mellitus: Secondary | ICD-10-CM | POA: Diagnosis not present

## 2023-03-08 DIAGNOSIS — J309 Allergic rhinitis, unspecified: Secondary | ICD-10-CM | POA: Diagnosis not present

## 2023-03-08 DIAGNOSIS — R600 Localized edema: Secondary | ICD-10-CM | POA: Diagnosis not present

## 2023-03-17 DIAGNOSIS — Z760 Encounter for issue of repeat prescription: Secondary | ICD-10-CM | POA: Diagnosis not present

## 2023-03-17 DIAGNOSIS — Z741 Need for assistance with personal care: Secondary | ICD-10-CM | POA: Diagnosis not present

## 2023-03-17 DIAGNOSIS — N390 Urinary tract infection, site not specified: Secondary | ICD-10-CM | POA: Diagnosis not present

## 2023-03-26 DIAGNOSIS — R293 Abnormal posture: Secondary | ICD-10-CM | POA: Diagnosis not present

## 2023-03-26 DIAGNOSIS — R2681 Unsteadiness on feet: Secondary | ICD-10-CM | POA: Diagnosis not present

## 2023-03-26 DIAGNOSIS — M6281 Muscle weakness (generalized): Secondary | ICD-10-CM | POA: Diagnosis not present

## 2023-03-26 DIAGNOSIS — M25562 Pain in left knee: Secondary | ICD-10-CM | POA: Diagnosis not present

## 2023-03-26 DIAGNOSIS — I5030 Unspecified diastolic (congestive) heart failure: Secondary | ICD-10-CM | POA: Diagnosis not present

## 2023-03-26 DIAGNOSIS — R262 Difficulty in walking, not elsewhere classified: Secondary | ICD-10-CM | POA: Diagnosis not present

## 2023-03-26 DIAGNOSIS — I10 Essential (primary) hypertension: Secondary | ICD-10-CM | POA: Diagnosis not present

## 2023-03-26 DIAGNOSIS — M158 Other polyosteoarthritis: Secondary | ICD-10-CM | POA: Diagnosis not present

## 2023-04-28 DIAGNOSIS — Z791 Long term (current) use of non-steroidal anti-inflammatories (NSAID): Secondary | ICD-10-CM | POA: Diagnosis not present

## 2023-04-28 DIAGNOSIS — Z96643 Presence of artificial hip joint, bilateral: Secondary | ICD-10-CM | POA: Diagnosis not present

## 2023-04-28 DIAGNOSIS — I10 Essential (primary) hypertension: Secondary | ICD-10-CM | POA: Diagnosis not present

## 2023-04-28 DIAGNOSIS — M5442 Lumbago with sciatica, left side: Secondary | ICD-10-CM | POA: Diagnosis not present

## 2023-04-28 DIAGNOSIS — Z9181 History of falling: Secondary | ICD-10-CM | POA: Diagnosis not present

## 2023-04-28 DIAGNOSIS — G8929 Other chronic pain: Secondary | ICD-10-CM | POA: Diagnosis not present

## 2023-04-28 DIAGNOSIS — Z7951 Long term (current) use of inhaled steroids: Secondary | ICD-10-CM | POA: Diagnosis not present

## 2023-05-01 DIAGNOSIS — I10 Essential (primary) hypertension: Secondary | ICD-10-CM | POA: Diagnosis not present

## 2023-05-01 DIAGNOSIS — Z7951 Long term (current) use of inhaled steroids: Secondary | ICD-10-CM | POA: Diagnosis not present

## 2023-05-01 DIAGNOSIS — M5442 Lumbago with sciatica, left side: Secondary | ICD-10-CM | POA: Diagnosis not present

## 2023-05-01 DIAGNOSIS — Z791 Long term (current) use of non-steroidal anti-inflammatories (NSAID): Secondary | ICD-10-CM | POA: Diagnosis not present

## 2023-05-01 DIAGNOSIS — G8929 Other chronic pain: Secondary | ICD-10-CM | POA: Diagnosis not present

## 2023-05-01 DIAGNOSIS — Z9181 History of falling: Secondary | ICD-10-CM | POA: Diagnosis not present

## 2023-05-01 DIAGNOSIS — Z96643 Presence of artificial hip joint, bilateral: Secondary | ICD-10-CM | POA: Diagnosis not present

## 2023-05-03 DIAGNOSIS — Z96643 Presence of artificial hip joint, bilateral: Secondary | ICD-10-CM | POA: Diagnosis not present

## 2023-05-03 DIAGNOSIS — G8929 Other chronic pain: Secondary | ICD-10-CM | POA: Diagnosis not present

## 2023-05-03 DIAGNOSIS — M5442 Lumbago with sciatica, left side: Secondary | ICD-10-CM | POA: Diagnosis not present

## 2023-05-03 DIAGNOSIS — Z7951 Long term (current) use of inhaled steroids: Secondary | ICD-10-CM | POA: Diagnosis not present

## 2023-05-03 DIAGNOSIS — I10 Essential (primary) hypertension: Secondary | ICD-10-CM | POA: Diagnosis not present

## 2023-05-03 DIAGNOSIS — Z9181 History of falling: Secondary | ICD-10-CM | POA: Diagnosis not present

## 2023-05-03 DIAGNOSIS — Z791 Long term (current) use of non-steroidal anti-inflammatories (NSAID): Secondary | ICD-10-CM | POA: Diagnosis not present

## 2023-05-04 DIAGNOSIS — J40 Bronchitis, not specified as acute or chronic: Secondary | ICD-10-CM | POA: Diagnosis not present

## 2023-05-04 DIAGNOSIS — R600 Localized edema: Secondary | ICD-10-CM | POA: Diagnosis not present

## 2023-05-05 DIAGNOSIS — Z741 Need for assistance with personal care: Secondary | ICD-10-CM | POA: Diagnosis not present

## 2023-05-05 DIAGNOSIS — R600 Localized edema: Secondary | ICD-10-CM | POA: Diagnosis not present

## 2023-05-05 DIAGNOSIS — J45909 Unspecified asthma, uncomplicated: Secondary | ICD-10-CM | POA: Diagnosis not present

## 2023-05-06 DIAGNOSIS — J45909 Unspecified asthma, uncomplicated: Secondary | ICD-10-CM | POA: Diagnosis not present

## 2023-05-08 DIAGNOSIS — I5032 Chronic diastolic (congestive) heart failure: Secondary | ICD-10-CM | POA: Diagnosis not present

## 2023-05-10 DIAGNOSIS — G8929 Other chronic pain: Secondary | ICD-10-CM | POA: Diagnosis not present

## 2023-05-10 DIAGNOSIS — I10 Essential (primary) hypertension: Secondary | ICD-10-CM | POA: Diagnosis not present

## 2023-05-10 DIAGNOSIS — Z791 Long term (current) use of non-steroidal anti-inflammatories (NSAID): Secondary | ICD-10-CM | POA: Diagnosis not present

## 2023-05-10 DIAGNOSIS — Z9181 History of falling: Secondary | ICD-10-CM | POA: Diagnosis not present

## 2023-05-10 DIAGNOSIS — M5442 Lumbago with sciatica, left side: Secondary | ICD-10-CM | POA: Diagnosis not present

## 2023-05-10 DIAGNOSIS — Z7951 Long term (current) use of inhaled steroids: Secondary | ICD-10-CM | POA: Diagnosis not present

## 2023-05-10 DIAGNOSIS — Z96643 Presence of artificial hip joint, bilateral: Secondary | ICD-10-CM | POA: Diagnosis not present

## 2023-05-15 DIAGNOSIS — I10 Essential (primary) hypertension: Secondary | ICD-10-CM | POA: Diagnosis not present

## 2023-05-15 DIAGNOSIS — M5442 Lumbago with sciatica, left side: Secondary | ICD-10-CM | POA: Diagnosis not present

## 2023-05-15 DIAGNOSIS — Z7951 Long term (current) use of inhaled steroids: Secondary | ICD-10-CM | POA: Diagnosis not present

## 2023-05-15 DIAGNOSIS — Z9181 History of falling: Secondary | ICD-10-CM | POA: Diagnosis not present

## 2023-05-15 DIAGNOSIS — G8929 Other chronic pain: Secondary | ICD-10-CM | POA: Diagnosis not present

## 2023-05-15 DIAGNOSIS — Z96643 Presence of artificial hip joint, bilateral: Secondary | ICD-10-CM | POA: Diagnosis not present

## 2023-05-15 DIAGNOSIS — Z791 Long term (current) use of non-steroidal anti-inflammatories (NSAID): Secondary | ICD-10-CM | POA: Diagnosis not present

## 2023-05-17 DIAGNOSIS — Z791 Long term (current) use of non-steroidal anti-inflammatories (NSAID): Secondary | ICD-10-CM | POA: Diagnosis not present

## 2023-05-17 DIAGNOSIS — I10 Essential (primary) hypertension: Secondary | ICD-10-CM | POA: Diagnosis not present

## 2023-05-17 DIAGNOSIS — Z7951 Long term (current) use of inhaled steroids: Secondary | ICD-10-CM | POA: Diagnosis not present

## 2023-05-17 DIAGNOSIS — G8929 Other chronic pain: Secondary | ICD-10-CM | POA: Diagnosis not present

## 2023-05-17 DIAGNOSIS — R062 Wheezing: Secondary | ICD-10-CM | POA: Diagnosis not present

## 2023-05-17 DIAGNOSIS — Z96643 Presence of artificial hip joint, bilateral: Secondary | ICD-10-CM | POA: Diagnosis not present

## 2023-05-17 DIAGNOSIS — M5442 Lumbago with sciatica, left side: Secondary | ICD-10-CM | POA: Diagnosis not present

## 2023-05-17 DIAGNOSIS — M7989 Other specified soft tissue disorders: Secondary | ICD-10-CM | POA: Diagnosis not present

## 2023-05-17 DIAGNOSIS — R0602 Shortness of breath: Secondary | ICD-10-CM | POA: Diagnosis not present

## 2023-05-17 DIAGNOSIS — Z9181 History of falling: Secondary | ICD-10-CM | POA: Diagnosis not present

## 2023-05-17 DIAGNOSIS — J45909 Unspecified asthma, uncomplicated: Secondary | ICD-10-CM | POA: Diagnosis not present

## 2023-05-21 DIAGNOSIS — R262 Difficulty in walking, not elsewhere classified: Secondary | ICD-10-CM | POA: Diagnosis not present

## 2023-05-21 DIAGNOSIS — M158 Other polyosteoarthritis: Secondary | ICD-10-CM | POA: Diagnosis not present

## 2023-05-21 DIAGNOSIS — R2681 Unsteadiness on feet: Secondary | ICD-10-CM | POA: Diagnosis not present

## 2023-05-21 DIAGNOSIS — M25562 Pain in left knee: Secondary | ICD-10-CM | POA: Diagnosis not present

## 2023-05-21 DIAGNOSIS — I5030 Unspecified diastolic (congestive) heart failure: Secondary | ICD-10-CM | POA: Diagnosis not present

## 2023-05-21 DIAGNOSIS — R293 Abnormal posture: Secondary | ICD-10-CM | POA: Diagnosis not present

## 2023-05-21 DIAGNOSIS — I10 Essential (primary) hypertension: Secondary | ICD-10-CM | POA: Diagnosis not present

## 2023-05-21 DIAGNOSIS — M6281 Muscle weakness (generalized): Secondary | ICD-10-CM | POA: Diagnosis not present

## 2023-05-22 DIAGNOSIS — Z9181 History of falling: Secondary | ICD-10-CM | POA: Diagnosis not present

## 2023-05-22 DIAGNOSIS — M5442 Lumbago with sciatica, left side: Secondary | ICD-10-CM | POA: Diagnosis not present

## 2023-05-22 DIAGNOSIS — G8929 Other chronic pain: Secondary | ICD-10-CM | POA: Diagnosis not present

## 2023-05-22 DIAGNOSIS — Z96643 Presence of artificial hip joint, bilateral: Secondary | ICD-10-CM | POA: Diagnosis not present

## 2023-05-22 DIAGNOSIS — Z780 Asymptomatic menopausal state: Secondary | ICD-10-CM | POA: Diagnosis not present

## 2023-05-22 DIAGNOSIS — M810 Age-related osteoporosis without current pathological fracture: Secondary | ICD-10-CM | POA: Diagnosis not present

## 2023-05-22 DIAGNOSIS — Z791 Long term (current) use of non-steroidal anti-inflammatories (NSAID): Secondary | ICD-10-CM | POA: Diagnosis not present

## 2023-05-22 DIAGNOSIS — I10 Essential (primary) hypertension: Secondary | ICD-10-CM | POA: Diagnosis not present

## 2023-05-22 DIAGNOSIS — Z7951 Long term (current) use of inhaled steroids: Secondary | ICD-10-CM | POA: Diagnosis not present

## 2023-05-22 DIAGNOSIS — Z13820 Encounter for screening for osteoporosis: Secondary | ICD-10-CM | POA: Diagnosis not present

## 2023-05-23 DIAGNOSIS — R293 Abnormal posture: Secondary | ICD-10-CM | POA: Diagnosis not present

## 2023-05-23 DIAGNOSIS — R2681 Unsteadiness on feet: Secondary | ICD-10-CM | POA: Diagnosis not present

## 2023-05-23 DIAGNOSIS — R262 Difficulty in walking, not elsewhere classified: Secondary | ICD-10-CM | POA: Diagnosis not present

## 2023-05-23 DIAGNOSIS — M25562 Pain in left knee: Secondary | ICD-10-CM | POA: Diagnosis not present

## 2023-05-23 DIAGNOSIS — M6281 Muscle weakness (generalized): Secondary | ICD-10-CM | POA: Diagnosis not present

## 2023-05-24 DIAGNOSIS — Z791 Long term (current) use of non-steroidal anti-inflammatories (NSAID): Secondary | ICD-10-CM | POA: Diagnosis not present

## 2023-05-24 DIAGNOSIS — Z96643 Presence of artificial hip joint, bilateral: Secondary | ICD-10-CM | POA: Diagnosis not present

## 2023-05-24 DIAGNOSIS — M5442 Lumbago with sciatica, left side: Secondary | ICD-10-CM | POA: Diagnosis not present

## 2023-05-24 DIAGNOSIS — Z9181 History of falling: Secondary | ICD-10-CM | POA: Diagnosis not present

## 2023-05-24 DIAGNOSIS — G8929 Other chronic pain: Secondary | ICD-10-CM | POA: Diagnosis not present

## 2023-05-24 DIAGNOSIS — Z7951 Long term (current) use of inhaled steroids: Secondary | ICD-10-CM | POA: Diagnosis not present

## 2023-05-24 DIAGNOSIS — I10 Essential (primary) hypertension: Secondary | ICD-10-CM | POA: Diagnosis not present

## 2023-05-27 DIAGNOSIS — R262 Difficulty in walking, not elsewhere classified: Secondary | ICD-10-CM | POA: Diagnosis not present

## 2023-05-27 DIAGNOSIS — I5030 Unspecified diastolic (congestive) heart failure: Secondary | ICD-10-CM | POA: Diagnosis not present

## 2023-05-27 DIAGNOSIS — I10 Essential (primary) hypertension: Secondary | ICD-10-CM | POA: Diagnosis not present

## 2023-05-27 DIAGNOSIS — R293 Abnormal posture: Secondary | ICD-10-CM | POA: Diagnosis not present

## 2023-05-27 DIAGNOSIS — M25562 Pain in left knee: Secondary | ICD-10-CM | POA: Diagnosis not present

## 2023-05-27 DIAGNOSIS — M6281 Muscle weakness (generalized): Secondary | ICD-10-CM | POA: Diagnosis not present

## 2023-05-27 DIAGNOSIS — M158 Other polyosteoarthritis: Secondary | ICD-10-CM | POA: Diagnosis not present

## 2023-05-27 DIAGNOSIS — R2681 Unsteadiness on feet: Secondary | ICD-10-CM | POA: Diagnosis not present

## 2023-05-28 DIAGNOSIS — M6281 Muscle weakness (generalized): Secondary | ICD-10-CM | POA: Diagnosis not present

## 2023-05-28 DIAGNOSIS — R262 Difficulty in walking, not elsewhere classified: Secondary | ICD-10-CM | POA: Diagnosis not present

## 2023-05-28 DIAGNOSIS — M25562 Pain in left knee: Secondary | ICD-10-CM | POA: Diagnosis not present

## 2023-05-28 DIAGNOSIS — R293 Abnormal posture: Secondary | ICD-10-CM | POA: Diagnosis not present

## 2023-05-28 DIAGNOSIS — R2681 Unsteadiness on feet: Secondary | ICD-10-CM | POA: Diagnosis not present

## 2023-05-29 DIAGNOSIS — Z9181 History of falling: Secondary | ICD-10-CM | POA: Diagnosis not present

## 2023-05-29 DIAGNOSIS — I10 Essential (primary) hypertension: Secondary | ICD-10-CM | POA: Diagnosis not present

## 2023-05-29 DIAGNOSIS — G8929 Other chronic pain: Secondary | ICD-10-CM | POA: Diagnosis not present

## 2023-05-29 DIAGNOSIS — Z791 Long term (current) use of non-steroidal anti-inflammatories (NSAID): Secondary | ICD-10-CM | POA: Diagnosis not present

## 2023-05-29 DIAGNOSIS — M5442 Lumbago with sciatica, left side: Secondary | ICD-10-CM | POA: Diagnosis not present

## 2023-05-29 DIAGNOSIS — Z7951 Long term (current) use of inhaled steroids: Secondary | ICD-10-CM | POA: Diagnosis not present

## 2023-05-29 DIAGNOSIS — Z96643 Presence of artificial hip joint, bilateral: Secondary | ICD-10-CM | POA: Diagnosis not present

## 2023-05-30 DIAGNOSIS — M25562 Pain in left knee: Secondary | ICD-10-CM | POA: Diagnosis not present

## 2023-05-30 DIAGNOSIS — R2681 Unsteadiness on feet: Secondary | ICD-10-CM | POA: Diagnosis not present

## 2023-05-30 DIAGNOSIS — R293 Abnormal posture: Secondary | ICD-10-CM | POA: Diagnosis not present

## 2023-05-30 DIAGNOSIS — M6281 Muscle weakness (generalized): Secondary | ICD-10-CM | POA: Diagnosis not present

## 2023-05-30 DIAGNOSIS — R262 Difficulty in walking, not elsewhere classified: Secondary | ICD-10-CM | POA: Diagnosis not present

## 2023-06-03 DIAGNOSIS — M6281 Muscle weakness (generalized): Secondary | ICD-10-CM | POA: Diagnosis not present

## 2023-06-03 DIAGNOSIS — R293 Abnormal posture: Secondary | ICD-10-CM | POA: Diagnosis not present

## 2023-06-03 DIAGNOSIS — R2681 Unsteadiness on feet: Secondary | ICD-10-CM | POA: Diagnosis not present

## 2023-06-03 DIAGNOSIS — R262 Difficulty in walking, not elsewhere classified: Secondary | ICD-10-CM | POA: Diagnosis not present

## 2023-06-03 DIAGNOSIS — M25562 Pain in left knee: Secondary | ICD-10-CM | POA: Diagnosis not present

## 2023-06-05 DIAGNOSIS — Z7951 Long term (current) use of inhaled steroids: Secondary | ICD-10-CM | POA: Diagnosis not present

## 2023-06-05 DIAGNOSIS — M7989 Other specified soft tissue disorders: Secondary | ICD-10-CM | POA: Diagnosis not present

## 2023-06-05 DIAGNOSIS — Z9181 History of falling: Secondary | ICD-10-CM | POA: Diagnosis not present

## 2023-06-05 DIAGNOSIS — Z96643 Presence of artificial hip joint, bilateral: Secondary | ICD-10-CM | POA: Diagnosis not present

## 2023-06-05 DIAGNOSIS — I10 Essential (primary) hypertension: Secondary | ICD-10-CM | POA: Diagnosis not present

## 2023-06-05 DIAGNOSIS — M5442 Lumbago with sciatica, left side: Secondary | ICD-10-CM | POA: Diagnosis not present

## 2023-06-05 DIAGNOSIS — G8929 Other chronic pain: Secondary | ICD-10-CM | POA: Diagnosis not present

## 2023-06-07 DIAGNOSIS — Z7951 Long term (current) use of inhaled steroids: Secondary | ICD-10-CM | POA: Diagnosis not present

## 2023-06-07 DIAGNOSIS — M5442 Lumbago with sciatica, left side: Secondary | ICD-10-CM | POA: Diagnosis not present

## 2023-06-07 DIAGNOSIS — G8929 Other chronic pain: Secondary | ICD-10-CM | POA: Diagnosis not present

## 2023-06-07 DIAGNOSIS — M7989 Other specified soft tissue disorders: Secondary | ICD-10-CM | POA: Diagnosis not present

## 2023-06-07 DIAGNOSIS — Z96643 Presence of artificial hip joint, bilateral: Secondary | ICD-10-CM | POA: Diagnosis not present

## 2023-06-07 DIAGNOSIS — Z9181 History of falling: Secondary | ICD-10-CM | POA: Diagnosis not present

## 2023-06-07 DIAGNOSIS — I10 Essential (primary) hypertension: Secondary | ICD-10-CM | POA: Diagnosis not present

## 2023-06-10 DIAGNOSIS — Z96643 Presence of artificial hip joint, bilateral: Secondary | ICD-10-CM | POA: Diagnosis not present

## 2023-06-10 DIAGNOSIS — Z7951 Long term (current) use of inhaled steroids: Secondary | ICD-10-CM | POA: Diagnosis not present

## 2023-06-10 DIAGNOSIS — G8929 Other chronic pain: Secondary | ICD-10-CM | POA: Diagnosis not present

## 2023-06-10 DIAGNOSIS — M5442 Lumbago with sciatica, left side: Secondary | ICD-10-CM | POA: Diagnosis not present

## 2023-06-10 DIAGNOSIS — I10 Essential (primary) hypertension: Secondary | ICD-10-CM | POA: Diagnosis not present

## 2023-06-10 DIAGNOSIS — Z9181 History of falling: Secondary | ICD-10-CM | POA: Diagnosis not present

## 2023-06-10 DIAGNOSIS — M7989 Other specified soft tissue disorders: Secondary | ICD-10-CM | POA: Diagnosis not present

## 2023-06-12 DIAGNOSIS — M7989 Other specified soft tissue disorders: Secondary | ICD-10-CM | POA: Diagnosis not present

## 2023-06-12 DIAGNOSIS — Z9181 History of falling: Secondary | ICD-10-CM | POA: Diagnosis not present

## 2023-06-12 DIAGNOSIS — R600 Localized edema: Secondary | ICD-10-CM | POA: Diagnosis not present

## 2023-06-12 DIAGNOSIS — M5442 Lumbago with sciatica, left side: Secondary | ICD-10-CM | POA: Diagnosis not present

## 2023-06-12 DIAGNOSIS — Z96643 Presence of artificial hip joint, bilateral: Secondary | ICD-10-CM | POA: Diagnosis not present

## 2023-06-12 DIAGNOSIS — R0602 Shortness of breath: Secondary | ICD-10-CM | POA: Diagnosis not present

## 2023-06-12 DIAGNOSIS — I10 Essential (primary) hypertension: Secondary | ICD-10-CM | POA: Diagnosis not present

## 2023-06-12 DIAGNOSIS — Z7951 Long term (current) use of inhaled steroids: Secondary | ICD-10-CM | POA: Diagnosis not present

## 2023-06-12 DIAGNOSIS — J45909 Unspecified asthma, uncomplicated: Secondary | ICD-10-CM | POA: Diagnosis not present

## 2023-06-12 DIAGNOSIS — G8929 Other chronic pain: Secondary | ICD-10-CM | POA: Diagnosis not present

## 2023-06-14 DIAGNOSIS — I5032 Chronic diastolic (congestive) heart failure: Secondary | ICD-10-CM | POA: Diagnosis not present

## 2023-06-14 DIAGNOSIS — I10 Essential (primary) hypertension: Secondary | ICD-10-CM | POA: Diagnosis not present

## 2023-06-14 DIAGNOSIS — I110 Hypertensive heart disease with heart failure: Secondary | ICD-10-CM | POA: Diagnosis not present

## 2023-06-19 DIAGNOSIS — Z96643 Presence of artificial hip joint, bilateral: Secondary | ICD-10-CM | POA: Diagnosis not present

## 2023-06-19 DIAGNOSIS — G8929 Other chronic pain: Secondary | ICD-10-CM | POA: Diagnosis not present

## 2023-06-19 DIAGNOSIS — M5442 Lumbago with sciatica, left side: Secondary | ICD-10-CM | POA: Diagnosis not present

## 2023-06-19 DIAGNOSIS — M7989 Other specified soft tissue disorders: Secondary | ICD-10-CM | POA: Diagnosis not present

## 2023-06-19 DIAGNOSIS — Z9181 History of falling: Secondary | ICD-10-CM | POA: Diagnosis not present

## 2023-06-19 DIAGNOSIS — I10 Essential (primary) hypertension: Secondary | ICD-10-CM | POA: Diagnosis not present

## 2023-06-19 DIAGNOSIS — Z7951 Long term (current) use of inhaled steroids: Secondary | ICD-10-CM | POA: Diagnosis not present

## 2023-06-26 DIAGNOSIS — M5442 Lumbago with sciatica, left side: Secondary | ICD-10-CM | POA: Diagnosis not present

## 2023-06-26 DIAGNOSIS — G8929 Other chronic pain: Secondary | ICD-10-CM | POA: Diagnosis not present

## 2023-06-26 DIAGNOSIS — Z7951 Long term (current) use of inhaled steroids: Secondary | ICD-10-CM | POA: Diagnosis not present

## 2023-06-26 DIAGNOSIS — M7989 Other specified soft tissue disorders: Secondary | ICD-10-CM | POA: Diagnosis not present

## 2023-06-26 DIAGNOSIS — Z96643 Presence of artificial hip joint, bilateral: Secondary | ICD-10-CM | POA: Diagnosis not present

## 2023-06-26 DIAGNOSIS — I10 Essential (primary) hypertension: Secondary | ICD-10-CM | POA: Diagnosis not present

## 2023-06-26 DIAGNOSIS — Z9181 History of falling: Secondary | ICD-10-CM | POA: Diagnosis not present

## 2023-07-03 DIAGNOSIS — M7989 Other specified soft tissue disorders: Secondary | ICD-10-CM | POA: Diagnosis not present

## 2023-07-03 DIAGNOSIS — G8929 Other chronic pain: Secondary | ICD-10-CM | POA: Diagnosis not present

## 2023-07-03 DIAGNOSIS — Z7951 Long term (current) use of inhaled steroids: Secondary | ICD-10-CM | POA: Diagnosis not present

## 2023-07-03 DIAGNOSIS — Z9181 History of falling: Secondary | ICD-10-CM | POA: Diagnosis not present

## 2023-07-03 DIAGNOSIS — I10 Essential (primary) hypertension: Secondary | ICD-10-CM | POA: Diagnosis not present

## 2023-07-03 DIAGNOSIS — Z96643 Presence of artificial hip joint, bilateral: Secondary | ICD-10-CM | POA: Diagnosis not present

## 2023-07-03 DIAGNOSIS — M5442 Lumbago with sciatica, left side: Secondary | ICD-10-CM | POA: Diagnosis not present

## 2023-07-10 DIAGNOSIS — Z7951 Long term (current) use of inhaled steroids: Secondary | ICD-10-CM | POA: Diagnosis not present

## 2023-07-10 DIAGNOSIS — J309 Allergic rhinitis, unspecified: Secondary | ICD-10-CM | POA: Diagnosis not present

## 2023-07-10 DIAGNOSIS — M7989 Other specified soft tissue disorders: Secondary | ICD-10-CM | POA: Diagnosis not present

## 2023-07-10 DIAGNOSIS — G8929 Other chronic pain: Secondary | ICD-10-CM | POA: Diagnosis not present

## 2023-07-10 DIAGNOSIS — I5032 Chronic diastolic (congestive) heart failure: Secondary | ICD-10-CM | POA: Diagnosis not present

## 2023-07-10 DIAGNOSIS — I10 Essential (primary) hypertension: Secondary | ICD-10-CM | POA: Diagnosis not present

## 2023-07-10 DIAGNOSIS — Z96643 Presence of artificial hip joint, bilateral: Secondary | ICD-10-CM | POA: Diagnosis not present

## 2023-07-10 DIAGNOSIS — M5442 Lumbago with sciatica, left side: Secondary | ICD-10-CM | POA: Diagnosis not present

## 2023-07-10 DIAGNOSIS — Z9181 History of falling: Secondary | ICD-10-CM | POA: Diagnosis not present

## 2023-07-17 DIAGNOSIS — Z96643 Presence of artificial hip joint, bilateral: Secondary | ICD-10-CM | POA: Diagnosis not present

## 2023-07-17 DIAGNOSIS — I10 Essential (primary) hypertension: Secondary | ICD-10-CM | POA: Diagnosis not present

## 2023-07-17 DIAGNOSIS — I5032 Chronic diastolic (congestive) heart failure: Secondary | ICD-10-CM | POA: Diagnosis not present

## 2023-07-17 DIAGNOSIS — G8929 Other chronic pain: Secondary | ICD-10-CM | POA: Diagnosis not present

## 2023-07-17 DIAGNOSIS — Z7951 Long term (current) use of inhaled steroids: Secondary | ICD-10-CM | POA: Diagnosis not present

## 2023-07-17 DIAGNOSIS — M7989 Other specified soft tissue disorders: Secondary | ICD-10-CM | POA: Diagnosis not present

## 2023-07-17 DIAGNOSIS — Z9181 History of falling: Secondary | ICD-10-CM | POA: Diagnosis not present

## 2023-07-17 DIAGNOSIS — I110 Hypertensive heart disease with heart failure: Secondary | ICD-10-CM | POA: Diagnosis not present

## 2023-07-17 DIAGNOSIS — M5442 Lumbago with sciatica, left side: Secondary | ICD-10-CM | POA: Diagnosis not present

## 2023-07-17 DIAGNOSIS — Z79899 Other long term (current) drug therapy: Secondary | ICD-10-CM | POA: Diagnosis not present

## 2023-07-26 DIAGNOSIS — G8929 Other chronic pain: Secondary | ICD-10-CM | POA: Diagnosis not present

## 2023-07-26 DIAGNOSIS — Z96643 Presence of artificial hip joint, bilateral: Secondary | ICD-10-CM | POA: Diagnosis not present

## 2023-07-26 DIAGNOSIS — Z7951 Long term (current) use of inhaled steroids: Secondary | ICD-10-CM | POA: Diagnosis not present

## 2023-07-26 DIAGNOSIS — Z9181 History of falling: Secondary | ICD-10-CM | POA: Diagnosis not present

## 2023-07-26 DIAGNOSIS — M5442 Lumbago with sciatica, left side: Secondary | ICD-10-CM | POA: Diagnosis not present

## 2023-07-26 DIAGNOSIS — M7989 Other specified soft tissue disorders: Secondary | ICD-10-CM | POA: Diagnosis not present

## 2023-07-26 DIAGNOSIS — I10 Essential (primary) hypertension: Secondary | ICD-10-CM | POA: Diagnosis not present

## 2023-08-07 DIAGNOSIS — J45909 Unspecified asthma, uncomplicated: Secondary | ICD-10-CM | POA: Diagnosis not present

## 2023-08-13 DIAGNOSIS — M25552 Pain in left hip: Secondary | ICD-10-CM | POA: Diagnosis not present

## 2023-08-13 DIAGNOSIS — M6281 Muscle weakness (generalized): Secondary | ICD-10-CM | POA: Diagnosis not present

## 2023-08-13 DIAGNOSIS — R269 Unspecified abnormalities of gait and mobility: Secondary | ICD-10-CM | POA: Diagnosis not present

## 2023-08-13 DIAGNOSIS — M79605 Pain in left leg: Secondary | ICD-10-CM | POA: Diagnosis not present

## 2023-08-14 DIAGNOSIS — R0602 Shortness of breath: Secondary | ICD-10-CM | POA: Diagnosis not present

## 2023-08-14 DIAGNOSIS — J45909 Unspecified asthma, uncomplicated: Secondary | ICD-10-CM | POA: Diagnosis not present

## 2023-08-14 DIAGNOSIS — R600 Localized edema: Secondary | ICD-10-CM | POA: Diagnosis not present

## 2023-08-14 DIAGNOSIS — I10 Essential (primary) hypertension: Secondary | ICD-10-CM | POA: Diagnosis not present

## 2023-08-27 DIAGNOSIS — M79605 Pain in left leg: Secondary | ICD-10-CM | POA: Diagnosis not present

## 2023-08-27 DIAGNOSIS — M6281 Muscle weakness (generalized): Secondary | ICD-10-CM | POA: Diagnosis not present

## 2023-08-27 DIAGNOSIS — M25552 Pain in left hip: Secondary | ICD-10-CM | POA: Diagnosis not present

## 2023-08-27 DIAGNOSIS — R269 Unspecified abnormalities of gait and mobility: Secondary | ICD-10-CM | POA: Diagnosis not present

## 2023-08-29 DIAGNOSIS — R269 Unspecified abnormalities of gait and mobility: Secondary | ICD-10-CM | POA: Diagnosis not present

## 2023-08-29 DIAGNOSIS — M6281 Muscle weakness (generalized): Secondary | ICD-10-CM | POA: Diagnosis not present

## 2023-08-29 DIAGNOSIS — M25552 Pain in left hip: Secondary | ICD-10-CM | POA: Diagnosis not present

## 2023-08-29 DIAGNOSIS — M79605 Pain in left leg: Secondary | ICD-10-CM | POA: Diagnosis not present

## 2023-09-10 DIAGNOSIS — M25552 Pain in left hip: Secondary | ICD-10-CM | POA: Diagnosis not present

## 2023-09-10 DIAGNOSIS — M6281 Muscle weakness (generalized): Secondary | ICD-10-CM | POA: Diagnosis not present

## 2023-09-10 DIAGNOSIS — R269 Unspecified abnormalities of gait and mobility: Secondary | ICD-10-CM | POA: Diagnosis not present

## 2023-09-10 DIAGNOSIS — M79605 Pain in left leg: Secondary | ICD-10-CM | POA: Diagnosis not present

## 2023-09-12 DIAGNOSIS — M6281 Muscle weakness (generalized): Secondary | ICD-10-CM | POA: Diagnosis not present

## 2023-09-12 DIAGNOSIS — M25552 Pain in left hip: Secondary | ICD-10-CM | POA: Diagnosis not present

## 2023-09-12 DIAGNOSIS — R269 Unspecified abnormalities of gait and mobility: Secondary | ICD-10-CM | POA: Diagnosis not present

## 2023-09-12 DIAGNOSIS — M79605 Pain in left leg: Secondary | ICD-10-CM | POA: Diagnosis not present

## 2023-09-19 DIAGNOSIS — M79605 Pain in left leg: Secondary | ICD-10-CM | POA: Diagnosis not present

## 2023-09-19 DIAGNOSIS — M6281 Muscle weakness (generalized): Secondary | ICD-10-CM | POA: Diagnosis not present

## 2023-09-19 DIAGNOSIS — M25552 Pain in left hip: Secondary | ICD-10-CM | POA: Diagnosis not present

## 2023-09-19 DIAGNOSIS — R269 Unspecified abnormalities of gait and mobility: Secondary | ICD-10-CM | POA: Diagnosis not present

## 2023-09-26 DIAGNOSIS — M6281 Muscle weakness (generalized): Secondary | ICD-10-CM | POA: Diagnosis not present

## 2023-09-26 DIAGNOSIS — M79605 Pain in left leg: Secondary | ICD-10-CM | POA: Diagnosis not present

## 2023-09-26 DIAGNOSIS — M25552 Pain in left hip: Secondary | ICD-10-CM | POA: Diagnosis not present

## 2023-09-26 DIAGNOSIS — R269 Unspecified abnormalities of gait and mobility: Secondary | ICD-10-CM | POA: Diagnosis not present

## 2023-10-01 DIAGNOSIS — R269 Unspecified abnormalities of gait and mobility: Secondary | ICD-10-CM | POA: Diagnosis not present

## 2023-10-01 DIAGNOSIS — M25552 Pain in left hip: Secondary | ICD-10-CM | POA: Diagnosis not present

## 2023-10-01 DIAGNOSIS — M79605 Pain in left leg: Secondary | ICD-10-CM | POA: Diagnosis not present

## 2023-10-01 DIAGNOSIS — M6281 Muscle weakness (generalized): Secondary | ICD-10-CM | POA: Diagnosis not present

## 2023-10-03 DIAGNOSIS — M25552 Pain in left hip: Secondary | ICD-10-CM | POA: Diagnosis not present

## 2023-10-03 DIAGNOSIS — M79605 Pain in left leg: Secondary | ICD-10-CM | POA: Diagnosis not present

## 2023-10-03 DIAGNOSIS — R269 Unspecified abnormalities of gait and mobility: Secondary | ICD-10-CM | POA: Diagnosis not present

## 2023-10-03 DIAGNOSIS — M6281 Muscle weakness (generalized): Secondary | ICD-10-CM | POA: Diagnosis not present

## 2023-10-17 DIAGNOSIS — M79605 Pain in left leg: Secondary | ICD-10-CM | POA: Diagnosis not present

## 2023-10-17 DIAGNOSIS — M25552 Pain in left hip: Secondary | ICD-10-CM | POA: Diagnosis not present

## 2023-10-17 DIAGNOSIS — M6281 Muscle weakness (generalized): Secondary | ICD-10-CM | POA: Diagnosis not present

## 2023-10-17 DIAGNOSIS — R269 Unspecified abnormalities of gait and mobility: Secondary | ICD-10-CM | POA: Diagnosis not present

## 2023-10-23 DIAGNOSIS — M79605 Pain in left leg: Secondary | ICD-10-CM | POA: Diagnosis not present

## 2023-10-23 DIAGNOSIS — M25552 Pain in left hip: Secondary | ICD-10-CM | POA: Diagnosis not present

## 2023-10-23 DIAGNOSIS — R269 Unspecified abnormalities of gait and mobility: Secondary | ICD-10-CM | POA: Diagnosis not present

## 2023-10-23 DIAGNOSIS — M6281 Muscle weakness (generalized): Secondary | ICD-10-CM | POA: Diagnosis not present

## 2023-10-25 DIAGNOSIS — M25552 Pain in left hip: Secondary | ICD-10-CM | POA: Diagnosis not present

## 2023-10-25 DIAGNOSIS — M6281 Muscle weakness (generalized): Secondary | ICD-10-CM | POA: Diagnosis not present

## 2023-10-25 DIAGNOSIS — M79605 Pain in left leg: Secondary | ICD-10-CM | POA: Diagnosis not present

## 2023-10-25 DIAGNOSIS — R269 Unspecified abnormalities of gait and mobility: Secondary | ICD-10-CM | POA: Diagnosis not present

## 2023-11-13 DIAGNOSIS — M47814 Spondylosis without myelopathy or radiculopathy, thoracic region: Secondary | ICD-10-CM | POA: Diagnosis not present

## 2023-11-13 DIAGNOSIS — Z0000 Encounter for general adult medical examination without abnormal findings: Secondary | ICD-10-CM | POA: Diagnosis not present

## 2023-11-13 DIAGNOSIS — J309 Allergic rhinitis, unspecified: Secondary | ICD-10-CM | POA: Diagnosis not present

## 2023-11-13 DIAGNOSIS — I10 Essential (primary) hypertension: Secondary | ICD-10-CM | POA: Diagnosis not present

## 2023-11-13 DIAGNOSIS — Z8619 Personal history of other infectious and parasitic diseases: Secondary | ICD-10-CM | POA: Diagnosis not present

## 2023-11-13 DIAGNOSIS — J986 Disorders of diaphragm: Secondary | ICD-10-CM | POA: Diagnosis not present

## 2023-11-13 DIAGNOSIS — J4520 Mild intermittent asthma, uncomplicated: Secondary | ICD-10-CM | POA: Diagnosis not present

## 2023-11-13 DIAGNOSIS — R269 Unspecified abnormalities of gait and mobility: Secondary | ICD-10-CM | POA: Diagnosis not present

## 2023-11-13 DIAGNOSIS — M25552 Pain in left hip: Secondary | ICD-10-CM | POA: Diagnosis not present

## 2023-11-13 DIAGNOSIS — M6281 Muscle weakness (generalized): Secondary | ICD-10-CM | POA: Diagnosis not present

## 2023-11-13 DIAGNOSIS — M79605 Pain in left leg: Secondary | ICD-10-CM | POA: Diagnosis not present

## 2023-11-13 DIAGNOSIS — D509 Iron deficiency anemia, unspecified: Secondary | ICD-10-CM | POA: Diagnosis not present

## 2023-11-13 DIAGNOSIS — I5032 Chronic diastolic (congestive) heart failure: Secondary | ICD-10-CM | POA: Diagnosis not present

## 2023-11-13 DIAGNOSIS — I700 Atherosclerosis of aorta: Secondary | ICD-10-CM | POA: Diagnosis not present

## 2023-11-13 DIAGNOSIS — K449 Diaphragmatic hernia without obstruction or gangrene: Secondary | ICD-10-CM | POA: Diagnosis not present

## 2023-11-15 DIAGNOSIS — R269 Unspecified abnormalities of gait and mobility: Secondary | ICD-10-CM | POA: Diagnosis not present

## 2023-11-15 DIAGNOSIS — M79605 Pain in left leg: Secondary | ICD-10-CM | POA: Diagnosis not present

## 2023-11-15 DIAGNOSIS — M6281 Muscle weakness (generalized): Secondary | ICD-10-CM | POA: Diagnosis not present

## 2023-11-15 DIAGNOSIS — M25552 Pain in left hip: Secondary | ICD-10-CM | POA: Diagnosis not present

## 2023-11-20 DIAGNOSIS — M25552 Pain in left hip: Secondary | ICD-10-CM | POA: Diagnosis not present

## 2023-11-20 DIAGNOSIS — R269 Unspecified abnormalities of gait and mobility: Secondary | ICD-10-CM | POA: Diagnosis not present

## 2023-11-20 DIAGNOSIS — M79605 Pain in left leg: Secondary | ICD-10-CM | POA: Diagnosis not present

## 2023-11-20 DIAGNOSIS — M6281 Muscle weakness (generalized): Secondary | ICD-10-CM | POA: Diagnosis not present

## 2023-11-22 DIAGNOSIS — R269 Unspecified abnormalities of gait and mobility: Secondary | ICD-10-CM | POA: Diagnosis not present

## 2023-11-22 DIAGNOSIS — M6281 Muscle weakness (generalized): Secondary | ICD-10-CM | POA: Diagnosis not present

## 2023-11-22 DIAGNOSIS — M79605 Pain in left leg: Secondary | ICD-10-CM | POA: Diagnosis not present

## 2023-11-22 DIAGNOSIS — M25552 Pain in left hip: Secondary | ICD-10-CM | POA: Diagnosis not present

## 2023-12-06 DIAGNOSIS — R269 Unspecified abnormalities of gait and mobility: Secondary | ICD-10-CM | POA: Diagnosis not present

## 2023-12-06 DIAGNOSIS — M25552 Pain in left hip: Secondary | ICD-10-CM | POA: Diagnosis not present

## 2023-12-06 DIAGNOSIS — M6281 Muscle weakness (generalized): Secondary | ICD-10-CM | POA: Diagnosis not present

## 2023-12-06 DIAGNOSIS — M79605 Pain in left leg: Secondary | ICD-10-CM | POA: Diagnosis not present

## 2023-12-11 DIAGNOSIS — M25552 Pain in left hip: Secondary | ICD-10-CM | POA: Diagnosis not present

## 2023-12-11 DIAGNOSIS — M79605 Pain in left leg: Secondary | ICD-10-CM | POA: Diagnosis not present

## 2023-12-11 DIAGNOSIS — M6281 Muscle weakness (generalized): Secondary | ICD-10-CM | POA: Diagnosis not present

## 2023-12-11 DIAGNOSIS — R269 Unspecified abnormalities of gait and mobility: Secondary | ICD-10-CM | POA: Diagnosis not present

## 2023-12-13 DIAGNOSIS — R269 Unspecified abnormalities of gait and mobility: Secondary | ICD-10-CM | POA: Diagnosis not present

## 2023-12-13 DIAGNOSIS — M25552 Pain in left hip: Secondary | ICD-10-CM | POA: Diagnosis not present

## 2023-12-13 DIAGNOSIS — M79605 Pain in left leg: Secondary | ICD-10-CM | POA: Diagnosis not present

## 2023-12-13 DIAGNOSIS — M6281 Muscle weakness (generalized): Secondary | ICD-10-CM | POA: Diagnosis not present

## 2023-12-18 DIAGNOSIS — M79605 Pain in left leg: Secondary | ICD-10-CM | POA: Diagnosis not present

## 2023-12-18 DIAGNOSIS — M25552 Pain in left hip: Secondary | ICD-10-CM | POA: Diagnosis not present

## 2023-12-18 DIAGNOSIS — M6281 Muscle weakness (generalized): Secondary | ICD-10-CM | POA: Diagnosis not present

## 2023-12-18 DIAGNOSIS — R269 Unspecified abnormalities of gait and mobility: Secondary | ICD-10-CM | POA: Diagnosis not present
